# Patient Record
Sex: Female | Born: 1990 | Race: Black or African American | Hispanic: No | Marital: Single | Smoking: Current every day smoker
Health system: Southern US, Community
[De-identification: ages and names within clinical notes are randomized; demographics above are authoritative.]

## PROBLEM LIST (undated history)

## (undated) ENCOUNTER — Inpatient Hospital Stay (HOSPITAL_COMMUNITY): Payer: Self-pay

## (undated) DIAGNOSIS — Z87891 Personal history of nicotine dependence: Secondary | ICD-10-CM

## (undated) DIAGNOSIS — Z3A41 41 weeks gestation of pregnancy: Secondary | ICD-10-CM

## (undated) DIAGNOSIS — N189 Chronic kidney disease, unspecified: Secondary | ICD-10-CM

## (undated) DIAGNOSIS — Z3009 Encounter for other general counseling and advice on contraception: Secondary | ICD-10-CM

## (undated) DIAGNOSIS — Z8619 Personal history of other infectious and parasitic diseases: Secondary | ICD-10-CM

## (undated) DIAGNOSIS — R51 Headache: Secondary | ICD-10-CM

## (undated) DIAGNOSIS — F1290 Cannabis use, unspecified, uncomplicated: Secondary | ICD-10-CM

## (undated) DIAGNOSIS — O480 Post-term pregnancy: Secondary | ICD-10-CM

## (undated) DIAGNOSIS — R519 Headache, unspecified: Secondary | ICD-10-CM

## (undated) DIAGNOSIS — O0990 Supervision of high risk pregnancy, unspecified, unspecified trimester: Secondary | ICD-10-CM

## (undated) DIAGNOSIS — B951 Streptococcus, group B, as the cause of diseases classified elsewhere: Secondary | ICD-10-CM

## (undated) HISTORY — DX: Encounter for other general counseling and advice on contraception: Z30.09

## (undated) HISTORY — DX: Post-term pregnancy: O48.0

## (undated) HISTORY — DX: 41 weeks gestation of pregnancy: Z3A.41

## (undated) HISTORY — DX: Supervision of high risk pregnancy, unspecified, unspecified trimester: O09.90

## (undated) HISTORY — PX: NO PAST SURGERIES: SHX2092

---
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
## 2009-06-18 ENCOUNTER — Emergency Department (HOSPITAL_COMMUNITY): Admission: EM | Admit: 2009-06-18 | Discharge: 2009-06-19 | Payer: Self-pay | Admitting: Emergency Medicine

## 2010-09-29 LAB — WET PREP, GENITAL
Clue Cells Wet Prep HPF POC: NONE SEEN
Trich, Wet Prep: NONE SEEN
Yeast Wet Prep HPF POC: NONE SEEN

## 2010-09-29 LAB — URINALYSIS, ROUTINE W REFLEX MICROSCOPIC
Bilirubin Urine: NEGATIVE
Hgb urine dipstick: NEGATIVE
Nitrite: NEGATIVE
Protein, ur: NEGATIVE mg/dL
Urobilinogen, UA: 1 mg/dL (ref 0.0–1.0)

## 2010-09-29 LAB — PREGNANCY, URINE: Preg Test, Ur: NEGATIVE

## 2010-10-14 LAB — URINALYSIS, ROUTINE W REFLEX MICROSCOPIC
Glucose, UA: NEGATIVE mg/dL
Ketones, ur: NEGATIVE mg/dL
pH: 6 (ref 5.0–8.0)

## 2010-10-14 LAB — URINE MICROSCOPIC-ADD ON

## 2010-10-14 LAB — POCT PREGNANCY, URINE: Preg Test, Ur: NEGATIVE

## 2010-10-14 LAB — GC/CHLAMYDIA PROBE AMP, GENITAL: Chlamydia, DNA Probe: NEGATIVE

## 2010-10-14 LAB — WET PREP, GENITAL: Clue Cells Wet Prep HPF POC: NONE SEEN

## 2012-04-25 ENCOUNTER — Encounter (HOSPITAL_COMMUNITY): Payer: Self-pay | Admitting: Emergency Medicine

## 2012-04-25 ENCOUNTER — Emergency Department (INDEPENDENT_AMBULATORY_CARE_PROVIDER_SITE_OTHER)
Admission: EM | Admit: 2012-04-25 | Discharge: 2012-04-25 | Disposition: A | Discharge status: Home / Self Care | Payer: Self-pay | Source: Home / Self Care | Attending: Family Medicine | Admitting: Family Medicine

## 2012-04-25 DIAGNOSIS — R112 Nausea with vomiting, unspecified: Secondary | ICD-10-CM

## 2012-04-25 DIAGNOSIS — Z331 Pregnant state, incidental: Secondary | ICD-10-CM

## 2012-04-25 DIAGNOSIS — Z3490 Encounter for supervision of normal pregnancy, unspecified, unspecified trimester: Secondary | ICD-10-CM

## 2012-04-25 DIAGNOSIS — R109 Unspecified abdominal pain: Secondary | ICD-10-CM

## 2012-04-25 LAB — POCT PREGNANCY, URINE: Preg Test, Ur: POSITIVE — AB

## 2012-04-25 LAB — POCT URINALYSIS DIP (DEVICE)
Ketones, ur: NEGATIVE mg/dL
pH: 6.5 (ref 5.0–8.0)

## 2012-04-25 MED ORDER — GNP PRENATAL VITAMINS 28-0.8 MG PO TABS: 1.0000 | ORAL_TABLET | Freq: Every day | ORAL | Status: DC

## 2012-04-25 NOTE — ED Provider Notes (Signed)
History     CSN: [id number]  Arrival date & time 04/25/12  1207   None     Chief Complaint  Patient presents with  . Abdominal Pain    (Consider location/radiation/quality/duration/timing/severity/associated sxs/prior treatment) Patient is a 21 y.o. female presenting with abdominal pain. The history is provided by the patient.  Abdominal Pain The primary symptoms of the illness include abdominal pain, nausea and vomiting.  Patient reports she has taken 2 home pregnancy test at home and both were positive.  Presents for verification of test results.  Reports nausea and vomiting for 1 week, Patient's last menstrual period was 03/08/2012.  +abdomial pain, -vaginal discharge or abnormal bleeding, -uti symptoms.    History reviewed. No pertinent past medical history.  History reviewed. No pertinent past surgical history.  No family history on file.  History  Substance Use Topics  . Smoking status: Current Every Day Smoker  . Smokeless tobacco: Not on file  . Alcohol Use: No    OB History    Grav Para Term Preterm Abortions TAB SAB Ect Mult Living                  Review of Systems  Constitutional: Negative.   Respiratory: Negative.   Cardiovascular: Negative.   Gastrointestinal: Positive for nausea, vomiting and abdominal pain.  Genitourinary: Positive for menstrual problem.    Allergies  Review of patient's allergies indicates no known allergies.  Home Medications   Current Outpatient Rx  Name Route Sig Dispense Refill  . GNP PRENATAL VITAMINS 28-0.8 MG PO TABS Oral Take 1 tablet by mouth daily. 30 tablet 6    BP 107/67  Pulse 84  Temp 98.8 F (37.1 C) (Oral)  Resp 18  SpO2 97%  LMP 03/08/2012  Physical Exam  Nursing note and vitals reviewed. Constitutional: She is oriented to person, place, and time. Vital signs are normal. She appears well-developed and well-nourished. She is active and cooperative.  HENT:  Head: Normocephalic.  Mouth/Throat:  Oropharynx is clear and moist. No oropharyngeal exudate.  Eyes: Conjunctivae normal are normal. Pupils are equal, round, and reactive to light. No scleral icterus.  Neck: Trachea normal and normal range of motion. Neck supple.  Cardiovascular: Normal rate, regular rhythm and normal heart sounds.   Pulmonary/Chest: Effort normal and breath sounds normal.  Abdominal: Soft. Bowel sounds are normal. There is no tenderness.  Musculoskeletal: Normal range of motion.  Lymphadenopathy:    She has no cervical adenopathy.  Neurological: She is alert and oriented to person, place, and time. No cranial nerve deficit or sensory deficit.  Skin: Skin is warm and dry.  Psychiatric: She has a normal mood and affect. Her speech is normal and behavior is normal. Judgment and thought content normal. Cognition and memory are normal.    ED Course  Procedures (including critical care time)  Labs Reviewed  POCT URINALYSIS DIP (DEVICE) - Abnormal; Notable for the following:    Bilirubin Urine SMALL (*)     Protein, ur 100 (*)     Leukocytes, UA TRACE (*)  Biochemical Testing Only. Please order routine urinalysis from main lab if confirmatory testing is needed.   All other components within normal limits  POCT PREGNANCY, URINE - Abnormal; Notable for the following:    Preg Test, Ur POSITIVE (*)     All other components within normal limits   No results found.   1. Pregnancy       MDM  Prenatal vitamins daily.  Follow  up with primary care obstetrics provider.  Resources provided for pregnancy.          Johnsie Kindred, NP 04/25/12 [phone]

## 2012-04-25 NOTE — ED Notes (Signed)
Upper abdominal pain  That started 7 days ago.  Reporst vomiting, vomiting is intermittent.  Patient very specific about having taken 2 home preg tests that were positive.  She is very specific about wanting pregnancy test and "letter"

## 2012-04-26 NOTE — ED Provider Notes (Signed)
Medical screening examination/treatment/procedure(s) were performed by resident physician or non-physician practitioner and as supervising physician I was immediately available for consultation/collaboration.   Aryonna Gunnerson DOUGLAS MD.    Cletus Mehlhoff D Shyla Gayheart, MD 04/26/12 0913 

## 2012-05-02 ENCOUNTER — Emergency Department (HOSPITAL_COMMUNITY)
Admission: EM | Admit: 2012-05-02 | Discharge: 2012-05-02 | Discharge status: Against Medical Advice | Payer: Self-pay | Attending: Emergency Medicine | Admitting: Emergency Medicine

## 2012-05-02 ENCOUNTER — Encounter (HOSPITAL_COMMUNITY): Payer: Self-pay | Admitting: Emergency Medicine

## 2012-05-02 DIAGNOSIS — Z5320 Procedure and treatment not carried out because of patient's decision for unspecified reasons: Secondary | ICD-10-CM | POA: Insufficient documentation

## 2012-05-02 DIAGNOSIS — F17200 Nicotine dependence, unspecified, uncomplicated: Secondary | ICD-10-CM | POA: Insufficient documentation

## 2012-05-02 LAB — POCT I-STAT, CHEM 8
BUN: 8 mg/dL (ref 6–23)
Calcium, Ion: 1.31 mmol/L — ABNORMAL HIGH (ref 1.12–1.23)
Creatinine, Ser: 0.8 mg/dL (ref 0.50–1.10)
Hemoglobin: 15 g/dL (ref 12.0–15.0)
Sodium: 138 mEq/L (ref 135–145)
TCO2: 23 mmol/L (ref 0–100)

## 2012-05-02 LAB — URINE MICROSCOPIC-ADD ON

## 2012-05-02 LAB — URINALYSIS, ROUTINE W REFLEX MICROSCOPIC
Hgb urine dipstick: NEGATIVE
Nitrite: NEGATIVE
Protein, ur: 30 mg/dL — AB
Specific Gravity, Urine: 1.033 — ABNORMAL HIGH (ref 1.005–1.030)
Urobilinogen, UA: 1 mg/dL (ref 0.0–1.0)

## 2012-05-02 LAB — POCT PREGNANCY, URINE: Preg Test, Ur: POSITIVE — AB

## 2012-05-02 NOTE — ED Notes (Addendum)
Pt c/o increased vomiting; pt sts is pregnant with LMP beginning of September; pt sts unabe to keep down prenatal vitamins; pt G2 P1

## 2012-05-04 LAB — URINE CULTURE: Colony Count: 85000

## 2012-06-25 ENCOUNTER — Inpatient Hospital Stay (HOSPITAL_COMMUNITY)
Admission: AD | Admit: 2012-06-25 | Discharge: 2012-06-25 | Disposition: A | Discharge status: Home / Self Care | Payer: Medicaid Other | Source: Ambulatory Visit | Attending: Family Medicine | Admitting: Family Medicine

## 2012-06-25 ENCOUNTER — Encounter (HOSPITAL_COMMUNITY): Payer: Self-pay | Admitting: *Deleted

## 2012-06-25 DIAGNOSIS — N390 Urinary tract infection, site not specified: Secondary | ICD-10-CM | POA: Insufficient documentation

## 2012-06-25 DIAGNOSIS — O26859 Spotting complicating pregnancy, unspecified trimester: Secondary | ICD-10-CM | POA: Insufficient documentation

## 2012-06-25 DIAGNOSIS — O0930 Supervision of pregnancy with insufficient antenatal care, unspecified trimester: Secondary | ICD-10-CM | POA: Insufficient documentation

## 2012-06-25 DIAGNOSIS — O2340 Unspecified infection of urinary tract in pregnancy, unspecified trimester: Secondary | ICD-10-CM

## 2012-06-25 DIAGNOSIS — O2390 Unspecified genitourinary tract infection in pregnancy, unspecified trimester: Secondary | ICD-10-CM

## 2012-06-25 LAB — URINALYSIS, ROUTINE W REFLEX MICROSCOPIC
Glucose, UA: NEGATIVE mg/dL
Ketones, ur: NEGATIVE mg/dL
Protein, ur: NEGATIVE mg/dL
Urobilinogen, UA: 0.2 mg/dL (ref 0.0–1.0)

## 2012-06-25 MED ORDER — NITROFURANTOIN MONOHYD MACRO 100 MG PO CAPS: 100.0000 mg | ORAL_CAPSULE | Freq: Two times a day (BID) | ORAL | Status: AC

## 2012-06-25 NOTE — MAU Note (Signed)
Pt reports she has had pink spotting since last night. Had some abd cramping yesterday none today. Pt had intercourse last night and saw the spotting afterward

## 2012-06-25 NOTE — MAU Provider Note (Signed)
History     CSN: [id number]  Arrival date and time: 06/25/12 1478   First Provider Initiated Contact with Patient 06/25/12 1028      Chief Complaint  Patient presents with  . Vaginal Bleeding   HPI Marisa Stephens is a 21 y.o. G46P1011 female at [redacted]w[redacted]d by stated lmp, hasn't received pnc as of yet- has contacted dr. Gaynell Face, but waiting on medicaid number.  Reports small amount light pink spotting from urethra when wiping w/ voiding since yesterday.  Last sexual intercourse last pm.  Increased urinary frequency, dysuria, and pressure w/ voiding, as well as urgency and hesitancy since yesterday.  Denies cramping, vaginal bleeding, lof, fever/chills, back/flank pain.  Reports good fm w/ quickening app 4-6wks ago.  OB History    Grav Para Term Preterm Abortions TAB SAB Ect Mult Living   3 1 1  1  1   1       Past Medical History  Diagnosis Date  . No pertinent past medical history     Past Surgical History  Procedure Date  . No past surgeries     Family History  Problem Relation Age of Onset  . Diabetes Maternal Grandmother     History  Substance Use Topics  . Smoking status: Current Every Day Smoker -- 0.2 packs/day for 5 years    Types: Cigarettes  . Smokeless tobacco: Not on file  . Alcohol Use: No    Allergies: No Known Allergies  No prescriptions prior to admission    Review of Systems  Constitutional: Negative.  Negative for fever and chills.  HENT: Negative.   Eyes: Negative.   Respiratory: Negative.   Cardiovascular: Negative.   Gastrointestinal: Negative.   Genitourinary: Positive for dysuria, urgency, frequency and hematuria. Negative for flank pain.       Also pressure   Musculoskeletal: Negative.   Skin: Negative.   Neurological: Negative.   Endo/Heme/Allergies: Negative.   Psychiatric/Behavioral: Negative.    Physical Exam   Blood pressure 124/61, pulse 101, temperature 97.6 F (36.4 C), temperature source Oral, resp. rate 18, height 5'  2" (1.575 m), weight 58.605 kg (129 lb 3.2 oz), last menstrual period 03/08/2012.  Physical Exam  Constitutional: She is oriented to person, place, and time. She appears well-developed and well-nourished.  HENT:  Head: Normocephalic.  Neck: Normal range of motion.  Cardiovascular: Normal rate and regular rhythm.   Respiratory: Effort normal and breath sounds normal.  GI: She exhibits no distension. There is no tenderness. There is no CVA tenderness.  Genitourinary:       Fundal height: u-1 Pelvic deferred    Musculoskeletal: Normal range of motion.  Neurological: She is alert and oriented to person, place, and time. She has normal reflexes.  Skin: Skin is warm and dry.  Psychiatric: She has a normal mood and affect. Her behavior is normal. Judgment and thought content normal.   FHT via doppler: 160  MAU Course  Procedures  FHT via doppler Physical exam UA, C&S  Results for orders placed during the hospital encounter of 06/25/12 (from the past 24 hour(s))  URINALYSIS, ROUTINE W REFLEX MICROSCOPIC     Status: Abnormal   Collection Time   06/25/12  8:55 AM      Component Value Range   Color, Urine YELLOW  YELLOW   APPearance CLOUDY (*) CLEAR   Specific Gravity, Urine 1.020  1.005 - 1.030   pH 6.0  5.0 - 8.0   Glucose, UA NEGATIVE  NEGATIVE mg/dL  Hgb urine dipstick MODERATE (*) NEGATIVE   Bilirubin Urine NEGATIVE  NEGATIVE   Ketones, ur NEGATIVE  NEGATIVE mg/dL   Protein, ur NEGATIVE  NEGATIVE mg/dL   Urobilinogen, UA 0.2  0.0 - 1.0 mg/dL   Nitrite NEGATIVE  NEGATIVE   Leukocytes, UA MODERATE (*) NEGATIVE  URINE MICROSCOPIC-ADD ON     Status: Abnormal   Collection Time   06/25/12  8:55 AM      Component Value Range   Squamous Epithelial / LPF FEW (*) RARE   WBC, UA 21-50  <3 WBC/hpf   RBC / HPF 0-2  <3 RBC/hpf     Seraphim, [place]  Home Medication Instructions [id number]   Printed on:06/25/12 1046  Medication Information                    nitrofurantoin,  macrocrystal-monohydrate, (MACROBID) 100 MG capsule Take 1 capsule (100 mg total) by mouth 2 (two) times daily. X 7days            Follow-up Information    Schedule an appointment as soon as possible for a visit with MARSHALL,BERNARD A, MD. (for your new ob visit as soon as you get your medicaid number in.)    Contact information:   [address] [place] [address] [phone]           Assessment and Plan  A:  [redacted]w[redacted]d pregnant  No PNC  UTI   P:  D/C home  Rx Macrobid 100mg  po bid x 7d  Make appt w/ Dr. Gaynell Face to begin [facility] ASAP  Push po fluids, cranberry juice, wipe front to back  Return to hospital if fever/chills, flank/back pain, or other concerns   Marge Duncans 06/25/2012, 10:29 AM

## 2012-06-25 NOTE — MAU Provider Note (Signed)
Chart reviewed and agree with management and plan.  

## 2012-06-25 NOTE — MAU Note (Signed)
"  Since yesterday when I wipe I get a pink d/c.  Sometimes it is a red mucousy thing that comes out too.  No abd pain.  I have pain when I have to use the BR...where my pee comes out at.  I feel pressure after I use the BR."

## 2012-06-25 NOTE — Progress Notes (Signed)
Pt inquiring about how much longer it would be before she is seen by a doctor.  Pt informed that Selena Batten, CNM was contacted after RN left room and would be here to see her soon.   Ice chips given.

## 2012-06-27 LAB — URINE CULTURE
Colony Count: 75000
Special Requests: NORMAL

## 2012-06-29 NOTE — L&D Delivery Note (Signed)
Delivery Note At 4:55 PM a viable female was delivered via Vaginal, Spontaneous Delivery (Presentation: ; Occiput Anterior).  APGAR: 9, 9; weight .   Placenta status: Intact, Spontaneous.  Cord: 3 vessels with the following complications: None.  Cord pH: not done  Anesthesia: Epidural  Episiotomy: None Lacerations: None Suture Repair: 2.0 Est. Blood Loss (mL):   Mom to postpartum.  Baby to nursery-stable.  Zeola Brys A 12/06/2012, 5:26 PM

## 2012-08-26 LAB — OB RESULTS CONSOLE ABO/RH: RH Type: POSITIVE

## 2012-08-26 LAB — OB RESULTS CONSOLE RUBELLA ANTIBODY, IGM: Rubella: IMMUNE

## 2012-08-26 LAB — OB RESULTS CONSOLE HIV ANTIBODY (ROUTINE TESTING): HIV: NONREACTIVE

## 2012-08-26 LAB — OB RESULTS CONSOLE HEPATITIS B SURFACE ANTIGEN: Hepatitis B Surface Ag: NEGATIVE

## 2012-10-08 ENCOUNTER — Inpatient Hospital Stay (HOSPITAL_COMMUNITY)
Admission: AD | Admit: 2012-10-08 | Discharge: 2012-10-09 | Disposition: A | Discharge status: Home / Self Care | Payer: Medicaid Other | Source: Ambulatory Visit | Attending: Obstetrics | Admitting: Obstetrics

## 2012-10-08 ENCOUNTER — Encounter (HOSPITAL_COMMUNITY): Payer: Self-pay | Admitting: *Deleted

## 2012-10-08 DIAGNOSIS — O479 False labor, unspecified: Secondary | ICD-10-CM | POA: Insufficient documentation

## 2012-10-08 DIAGNOSIS — O4703 False labor before 37 completed weeks of gestation, third trimester: Secondary | ICD-10-CM

## 2012-10-08 LAB — URINALYSIS, ROUTINE W REFLEX MICROSCOPIC
Bilirubin Urine: NEGATIVE
Glucose, UA: NEGATIVE mg/dL
Ketones, ur: NEGATIVE mg/dL
Protein, ur: NEGATIVE mg/dL

## 2012-10-08 LAB — WET PREP, GENITAL
Trich, Wet Prep: NONE SEEN
Yeast Wet Prep HPF POC: NONE SEEN

## 2012-10-08 LAB — URINE MICROSCOPIC-ADD ON

## 2012-10-08 MED ORDER — NIFEDIPINE 10 MG PO CAPS
10.0000 mg | ORAL_CAPSULE | Freq: Once | ORAL | Status: AC
  Administered 2012-10-08: 10 mg via ORAL
  Filled 2012-10-08: qty 1

## 2012-10-08 NOTE — MAU Provider Note (Signed)
History     CSN: [id number]  Arrival date and time: 10/08/12 2137   None     Chief Complaint  Patient presents with  . Vaginal Discharge   HPI Marisa Stephens is a 22 y.o. G31P1011 female at [redacted]w[redacted]d who presents w/ report of losing mucous plug at 2000 tonight- describes as greenish-yellow mucous, uc's x 3 days becoming more frequent today. Reports good fm. Denies lof, vb, abnormal or malodorous d/c. Does report vaginal itching x 1 wk, urinary frequency and urgency x 3d. Denies dysuria or hesitancy. Receives pnc w/ dr. Gaynell Face. Last appt at end of March. Next appt on 4/15.   OB History   Grav Para Term Preterm Abortions TAB SAB Ect Mult Living   3 1 1  1  1   1       Past Medical History  Diagnosis Date  . No pertinent past medical history   . Medical history non-contributory     Past Surgical History  Procedure Laterality Date  . No past surgeries      Family History  Problem Relation Age of Onset  . Diabetes Maternal Grandmother     History  Substance Use Topics  . Smoking status: Current Every Day Smoker -- 0.25 packs/day for 5 years    Types: Cigarettes  . Smokeless tobacco: Not on file  . Alcohol Use: No    Allergies: No Known Allergies  Prescriptions prior to admission  Medication Sig Dispense Refill  . acetaminophen (TYLENOL) 500 MG tablet Take 500 mg by mouth every 6 (six) hours as needed for pain.      . Prenatal Vit-Fe Fumarate-FA (PRENATAL MULTIVITAMIN) TABS Take 1 tablet by mouth daily at 12 noon.        Review of Systems  Constitutional: Negative.   HENT: Negative.   Eyes: Negative.   Respiratory: Negative.   Cardiovascular: Negative.   Gastrointestinal: Positive for abdominal pain (pelvic pressure today).  Genitourinary: Positive for frequency. Negative for dysuria, urgency and hematuria.  Musculoskeletal: Negative.   Skin: Negative.   Neurological: Negative.   Endo/Heme/Allergies: Negative.   Psychiatric/Behavioral: Negative.     Physical Exam   Blood pressure 133/86, pulse 132, temperature 98.2 F (36.8 C), resp. rate 18, height 5' (1.524 m), weight 67.405 kg (148 lb 9.6 oz), last menstrual period 03/08/2012, SpO2 100.00%.  Physical Exam  Constitutional: She is oriented to person, place, and time. She appears well-developed and well-nourished.  HENT:  Head: Normocephalic.  Neck: Normal range of motion.  Cardiovascular: Normal rate and regular rhythm.   Respiratory: Effort normal and breath sounds normal.  GI: Soft.  gravid  Genitourinary: Vagina normal and uterus normal.  Spec exam: small amount white non-odorous d/c, cervix visually closed.  Gc/ct, wet prep obtained.  Had sex today, fFN not collected  SVE: 1.5/th/-2, vtx  Musculoskeletal: Normal range of motion.  Neurological: She is alert and oriented to person, place, and time. She has normal reflexes.  Skin: Skin is warm and dry.  Psychiatric: She has a normal mood and affect. Her behavior is normal. Judgment and thought content normal.  FHR: 155, mod variability, 15x15 accels, no decels=Cat I UCs: q 2-4  MAU Course  Procedures  NST  UA Spec exam w/ wet prep, gc/ct obtained SVE Procardia 10mg  po x1  Results for orders placed during the hospital encounter of 10/08/12 (from the past 24 hour(s))  URINALYSIS, ROUTINE W REFLEX MICROSCOPIC     Status: Abnormal   Collection Time  10/08/12  9:55 PM      Result Value Range   Color, Urine YELLOW  YELLOW   APPearance CLEAR  CLEAR   Specific Gravity, Urine <1.005 (*) 1.005 - 1.030   pH 6.0  5.0 - 8.0   Glucose, UA NEGATIVE  NEGATIVE mg/dL   Hgb urine dipstick TRACE (*) NEGATIVE   Bilirubin Urine NEGATIVE  NEGATIVE   Ketones, ur NEGATIVE  NEGATIVE mg/dL   Protein, ur NEGATIVE  NEGATIVE mg/dL   Urobilinogen, UA 0.2  0.0 - 1.0 mg/dL   Nitrite NEGATIVE  NEGATIVE   Leukocytes, UA MODERATE (*) NEGATIVE  URINE MICROSCOPIC-ADD ON     Status: None   Collection Time    10/08/12  9:55 PM      Result  Value Range   Squamous Epithelial / LPF RARE  RARE   WBC, UA 3-6  <3 WBC/hpf   RBC / HPF 0-2  <3 RBC/hpf  WET PREP, GENITAL     Status: Abnormal   Collection Time    10/08/12 11:15 PM      Result Value Range   Yeast Wet Prep HPF POC NONE SEEN  NONE SEEN   Trich, Wet Prep NONE SEEN  NONE SEEN   Clue Cells Wet Prep HPF POC FEW (*) NONE SEEN   WBC, Wet Prep HPF POC FEW (*) NONE SEEN    2326: Discussed w/ Dr. Tamela Oddi, give procardia po x 1 now 0020: Notified Dr. Jean RosenthalChristell Constant 1 dose of procardia hasn't slowed uc's, orders to recheck- if still the same can send home, treat BV Assessment and Plan  A:  [redacted]w[redacted]d SIUP  Preterm uc's  Cat I FHR  BV  P:  D/C home per Dr. Tamela Oddi  Rx Metronidazole 500mg  BID x 7d  Keep next appt as scheduled on 4/15  Return for increased frequency/intensity of uc's, lof, vb, decreased fm or other concerns   Marge Duncans 10/08/2012, 11:03 PM

## 2012-10-08 NOTE — MAU Note (Signed)
Lost mucous plug about 2000. Contractions for 3 days. Pelvic pressure

## 2012-10-08 NOTE — MAU Note (Signed)
Pt. Here due to losing mucous plug around 2000 tonite. Has been contracting for 3 days per Aunt contractions have gotten closer together this evening. Denies any leakage. Denies blood. Baby is active per pt. No problems with movement.

## 2012-10-09 DIAGNOSIS — O479 False labor, unspecified: Secondary | ICD-10-CM

## 2012-10-09 MED ORDER — METRONIDAZOLE 500 MG PO TABS: 500.0000 mg | ORAL_TABLET | Freq: Two times a day (BID) | ORAL | Status: DC

## 2012-10-10 LAB — GC/CHLAMYDIA PROBE AMP: CT Probe RNA: NEGATIVE

## 2012-11-08 LAB — OB RESULTS CONSOLE GC/CHLAMYDIA: Gonorrhea: NEGATIVE

## 2012-12-06 ENCOUNTER — Encounter (HOSPITAL_COMMUNITY): Payer: Self-pay | Admitting: Anesthesiology

## 2012-12-06 ENCOUNTER — Inpatient Hospital Stay (HOSPITAL_COMMUNITY)
Admission: AD | Admit: 2012-12-06 | Discharge: 2012-12-08 | DRG: 775 | Disposition: A | Discharge status: Home / Self Care | Payer: Medicaid Other | Source: Ambulatory Visit | Attending: Obstetrics | Admitting: Obstetrics

## 2012-12-06 ENCOUNTER — Inpatient Hospital Stay (HOSPITAL_COMMUNITY): Payer: Medicaid Other | Admitting: Anesthesiology

## 2012-12-06 ENCOUNTER — Encounter (HOSPITAL_COMMUNITY): Payer: Self-pay

## 2012-12-06 DIAGNOSIS — Z22330 Carrier of Group B streptococcus: Secondary | ICD-10-CM

## 2012-12-06 DIAGNOSIS — O99892 Other specified diseases and conditions complicating childbirth: Principal | ICD-10-CM | POA: Diagnosis present

## 2012-12-06 LAB — CBC
HCT: 36.8 % (ref 36.0–46.0)
Hemoglobin: 12.8 g/dL (ref 12.0–15.0)
MCHC: 34.8 g/dL (ref 30.0–36.0)
MCV: 93.9 fL (ref 78.0–100.0)
RDW: 13.1 % (ref 11.5–15.5)

## 2012-12-06 MED ORDER — IBUPROFEN 600 MG PO TABS
600.0000 mg | ORAL_TABLET | Freq: Four times a day (QID) | ORAL | Status: DC | PRN
  Administered 2012-12-07: 600 mg via ORAL
  Filled 2012-12-06 (×2): qty 1

## 2012-12-06 MED ORDER — ACETAMINOPHEN 325 MG PO TABS: 650.0000 mg | ORAL_TABLET | ORAL | Status: DC | PRN

## 2012-12-06 MED ORDER — BUTORPHANOL TARTRATE 1 MG/ML IJ SOLN
1.0000 mg | INTRAMUSCULAR | Status: DC | PRN
  Administered 2012-12-06: 1 mg via INTRAVENOUS
  Filled 2012-12-06: qty 1

## 2012-12-06 MED ORDER — ZOLPIDEM TARTRATE 5 MG PO TABS: 5.0000 mg | ORAL_TABLET | Freq: Every evening | ORAL | Status: DC | PRN

## 2012-12-06 MED ORDER — WITCH HAZEL-GLYCERIN EX PADS: 1.0000 "application " | MEDICATED_PAD | CUTANEOUS | Status: DC | PRN

## 2012-12-06 MED ORDER — ONDANSETRON HCL 4 MG PO TABS: 4.0000 mg | ORAL_TABLET | ORAL | Status: DC | PRN

## 2012-12-06 MED ORDER — FENTANYL 2.5 MCG/ML BUPIVACAINE 1/10 % EPIDURAL INFUSION (WH - ANES)
14.0000 mL/h | INTRAMUSCULAR | Status: DC | PRN
  Filled 2012-12-06: qty 125

## 2012-12-06 MED ORDER — OXYTOCIN 40 UNITS IN LACTATED RINGERS INFUSION - SIMPLE MED: 62.5000 mL/h | INTRAVENOUS | Status: DC

## 2012-12-06 MED ORDER — TETANUS-DIPHTH-ACELL PERTUSSIS 5-2.5-18.5 LF-MCG/0.5 IM SUSP
0.5000 mL | Freq: Once | INTRAMUSCULAR | Status: AC
Start: 2012-12-07 — End: 2012-12-07
  Administered 2012-12-07: 0.5 mL via INTRAMUSCULAR

## 2012-12-06 MED ORDER — DIPHENHYDRAMINE HCL 50 MG/ML IJ SOLN: 12.5000 mg | INTRAMUSCULAR | Status: DC | PRN

## 2012-12-06 MED ORDER — ONDANSETRON HCL 4 MG/2ML IJ SOLN: 4.0000 mg | Freq: Four times a day (QID) | INTRAMUSCULAR | Status: DC | PRN

## 2012-12-06 MED ORDER — ONDANSETRON HCL 4 MG/2ML IJ SOLN: 4.0000 mg | INTRAMUSCULAR | Status: DC | PRN

## 2012-12-06 MED ORDER — SENNOSIDES-DOCUSATE SODIUM 8.6-50 MG PO TABS
2.0000 | ORAL_TABLET | Freq: Every day | ORAL | Status: DC
  Administered 2012-12-06: 2 via ORAL

## 2012-12-06 MED ORDER — PHENYLEPHRINE 40 MCG/ML (10ML) SYRINGE FOR IV PUSH (FOR BLOOD PRESSURE SUPPORT)
80.0000 ug | PREFILLED_SYRINGE | INTRAVENOUS | Status: DC | PRN
  Filled 2012-12-06: qty 2

## 2012-12-06 MED ORDER — EPHEDRINE 5 MG/ML INJ
10.0000 mg | INTRAVENOUS | Status: DC | PRN
  Filled 2012-12-06: qty 2

## 2012-12-06 MED ORDER — LANOLIN HYDROUS EX OINT: TOPICAL_OINTMENT | CUTANEOUS | Status: DC | PRN

## 2012-12-06 MED ORDER — LIDOCAINE HCL (PF) 1 % IJ SOLN
30.0000 mL | INTRAMUSCULAR | Status: DC | PRN
  Filled 2012-12-06 (×2): qty 30

## 2012-12-06 MED ORDER — SIMETHICONE 80 MG PO CHEW: 80.0000 mg | CHEWABLE_TABLET | ORAL | Status: DC | PRN

## 2012-12-06 MED ORDER — FLEET ENEMA 7-19 GM/118ML RE ENEM: 1.0000 | ENEMA | RECTAL | Status: DC | PRN

## 2012-12-06 MED ORDER — PENICILLIN G POTASSIUM 5000000 UNITS IJ SOLR
5.0000 10*6.[IU] | Freq: Once | INTRAVENOUS | Status: AC
  Administered 2012-12-06: 5 10*6.[IU] via INTRAVENOUS
  Filled 2012-12-06: qty 5

## 2012-12-06 MED ORDER — OXYCODONE-ACETAMINOPHEN 5-325 MG PO TABS: 1.0000 | ORAL_TABLET | ORAL | Status: DC | PRN

## 2012-12-06 MED ORDER — DIBUCAINE 1 % RE OINT: 1.0000 "application " | TOPICAL_OINTMENT | RECTAL | Status: DC | PRN

## 2012-12-06 MED ORDER — FENTANYL 2.5 MCG/ML BUPIVACAINE 1/10 % EPIDURAL INFUSION (WH - ANES)
INTRAMUSCULAR | Status: DC | PRN
  Administered 2012-12-06: 14 mL/h via EPIDURAL

## 2012-12-06 MED ORDER — OXYTOCIN 40 UNITS IN LACTATED RINGERS INFUSION - SIMPLE MED
1.0000 m[IU]/min | INTRAVENOUS | Status: DC
  Administered 2012-12-06: 2 m[IU]/min via INTRAVENOUS
  Administered 2012-12-06: 4 m[IU]/min via INTRAVENOUS
  Filled 2012-12-06: qty 1000

## 2012-12-06 MED ORDER — PHENYLEPHRINE 40 MCG/ML (10ML) SYRINGE FOR IV PUSH (FOR BLOOD PRESSURE SUPPORT)
80.0000 ug | PREFILLED_SYRINGE | INTRAVENOUS | Status: DC | PRN
  Filled 2012-12-06: qty 5
  Filled 2012-12-06: qty 2

## 2012-12-06 MED ORDER — CITRIC ACID-SODIUM CITRATE 334-500 MG/5ML PO SOLN: 30.0000 mL | ORAL | Status: DC | PRN

## 2012-12-06 MED ORDER — IBUPROFEN 600 MG PO TABS
600.0000 mg | ORAL_TABLET | Freq: Four times a day (QID) | ORAL | Status: DC
  Administered 2012-12-07 – 2012-12-08 (×5): 600 mg via ORAL
  Filled 2012-12-06 (×4): qty 1

## 2012-12-06 MED ORDER — LACTATED RINGERS IV SOLN: 500.0000 mL | INTRAVENOUS | Status: DC | PRN

## 2012-12-06 MED ORDER — LIDOCAINE HCL (PF) 1 % IJ SOLN
INTRAMUSCULAR | Status: DC | PRN
  Administered 2012-12-06 (×2): 8 mL

## 2012-12-06 MED ORDER — OXYTOCIN BOLUS FROM INFUSION: 500.0000 mL | INTRAVENOUS | Status: DC

## 2012-12-06 MED ORDER — OXYCODONE-ACETAMINOPHEN 5-325 MG PO TABS
1.0000 | ORAL_TABLET | ORAL | Status: DC | PRN
  Administered 2012-12-07: 2 via ORAL
  Filled 2012-12-06: qty 2
  Filled 2012-12-06: qty 1

## 2012-12-06 MED ORDER — PRENATAL MULTIVITAMIN CH
1.0000 | ORAL_TABLET | Freq: Every day | ORAL | Status: DC
  Administered 2012-12-07: 1 via ORAL
  Filled 2012-12-06: qty 1

## 2012-12-06 MED ORDER — FERROUS SULFATE 325 (65 FE) MG PO TABS
325.0000 mg | ORAL_TABLET | Freq: Two times a day (BID) | ORAL | Status: DC
  Administered 2012-12-07 (×2): 325 mg via ORAL
  Filled 2012-12-06 (×3): qty 1

## 2012-12-06 MED ORDER — LACTATED RINGERS IV SOLN
500.0000 mL | Freq: Once | INTRAVENOUS | Status: AC
  Administered 2012-12-06: 500 mL via INTRAVENOUS

## 2012-12-06 MED ORDER — PENICILLIN G POTASSIUM 5000000 UNITS IJ SOLR
2.5000 10*6.[IU] | INTRAVENOUS | Status: DC
  Administered 2012-12-06: 2.5 10*6.[IU] via INTRAVENOUS
  Filled 2012-12-06 (×4): qty 2.5

## 2012-12-06 MED ORDER — TERBUTALINE SULFATE 1 MG/ML IJ SOLN: 0.2500 mg | Freq: Once | INTRAMUSCULAR | Status: DC | PRN

## 2012-12-06 MED ORDER — DIPHENHYDRAMINE HCL 25 MG PO CAPS: 25.0000 mg | ORAL_CAPSULE | Freq: Four times a day (QID) | ORAL | Status: DC | PRN

## 2012-12-06 MED ORDER — LACTATED RINGERS IV SOLN
INTRAVENOUS | Status: DC
  Administered 2012-12-06 (×2): via INTRAVENOUS

## 2012-12-06 MED ORDER — BENZOCAINE-MENTHOL 20-0.5 % EX AERO: 1.0000 "application " | INHALATION_SPRAY | CUTANEOUS | Status: DC | PRN

## 2012-12-06 MED ORDER — EPHEDRINE 5 MG/ML INJ
10.0000 mg | INTRAVENOUS | Status: DC | PRN
  Filled 2012-12-06: qty 4
  Filled 2012-12-06: qty 2

## 2012-12-06 NOTE — H&P (Signed)
This is Dr. Francoise Ceo dictating the history and physical on  Marisa Stephens she's a 21 year oldgravida 3 para 1011 at 104 and 5 EDC 12/15/2012 membranes ruptured spontaneously at 8:00 this morning having irregular contractions cervix is 2 cm 90% vertex -2-3 and she will has a positive GBS on penicillin today on low-dose Pitocin Past medical history negative Past surgical history negative Social history negative System review negative Physical exam well-developed female in no distress HEENT negative Lungs clear to P&A Breasts negative Abdomen term Pelvic as described above Extremities negative

## 2012-12-06 NOTE — Anesthesia Preprocedure Evaluation (Signed)

## 2012-12-06 NOTE — MAU Note (Signed)
Pt states was 2cm in office last week. Water broke around 0800, clear fluid. Irregular contractions.

## 2012-12-06 NOTE — MAU Note (Signed)
Patient states she started leaking clear fluid at 0800.  Continues to leak down legs on arrival to MAU. States contractions irregular. Denies bleeding and reports good fetal movement.

## 2012-12-06 NOTE — Anesthesia Procedure Notes (Signed)
Epidural Patient location during procedure: OB Start time: 12/06/2012 3:12 PM End time: 12/06/2012 3:16 PM  Staffing Anesthesiologist: Sandrea Hughs Performed by: anesthesiologist   Preanesthetic Checklist Completed: patient identified, site marked, surgical consent, pre-op evaluation, timeout performed, IV checked, risks and benefits discussed and monitors and equipment checked  Epidural Patient position: sitting Prep: site prepped and draped and DuraPrep Patient monitoring: continuous pulse ox and blood pressure Approach: midline Injection technique: LOR air  Needle:  Needle type: Tuohy  Needle gauge: 17 G Needle length: 9 cm and 9 Needle insertion depth: 5 cm cm Catheter type: closed end flexible Catheter size: 19 Gauge Catheter at skin depth: 10 cm Test dose: negative and Other  Assessment Sensory level: T9 Events: blood not aspirated, injection not painful, no injection resistance, negative IV test and no paresthesia  Additional Notes Reason for block:procedure for pain

## 2012-12-07 ENCOUNTER — Encounter (HOSPITAL_COMMUNITY): Payer: Self-pay | Admitting: *Deleted

## 2012-12-07 LAB — CBC
Hemoglobin: 9.9 g/dL — ABNORMAL LOW (ref 12.0–15.0)
Platelets: 209 10*3/uL (ref 150–400)
RBC: 3.04 MIL/uL — ABNORMAL LOW (ref 3.87–5.11)
WBC: 12.3 10*3/uL — ABNORMAL HIGH (ref 4.0–10.5)

## 2012-12-07 NOTE — Progress Notes (Signed)
UR chart review completed.  

## 2012-12-07 NOTE — Progress Notes (Signed)
Patient ID: Marisa Stephens, female   DOB: 1990-09-14, 22 y.o.   MRN: [id number] Postpartum day one Vital signs normal Fundus firm Lochia moderate Legs negative doing well

## 2012-12-07 NOTE — Anesthesia Postprocedure Evaluation (Signed)
Anesthesia Post Note  Patient: Marisa Stephens  Procedure(s) Performed: * No procedures listed *  Anesthesia type: Epidural  Patient location: Mother/Baby  Post pain: Pain level controlled  Post assessment: Post-op Vital signs reviewed  Last Vitals:  Filed Vitals:   12/07/12 0825  BP: 108/70  Pulse: 79  Temp: 36.6 C  Resp: 15    Post vital signs: Reviewed  Level of consciousness: awake  Complications: No apparent anesthesia complications

## 2012-12-07 NOTE — Anesthesia Postprocedure Evaluation (Signed)
  Anesthesia Post-op Note  Patient: Marisa Stephens  Procedure(s) Performed: * No procedures listed *  Patient Location: Mother/Baby  Anesthesia Type:Epidural  Level of Consciousness: awake  Airway and Oxygen Therapy: Patient Spontanous Breathing  Post-op Pain: mild  Post-op Assessment: Patient's Cardiovascular Status Stable and Respiratory Function Stable  Post-op Vital Signs: stable  Complications: No apparent anesthesia complications

## 2012-12-08 NOTE — Discharge Summary (Signed)
Obstetric Discharge Summary Reason for Admission: onset of labor Prenatal Procedures: none Intrapartum Procedures: spontaneous vaginal delivery Postpartum Procedures: none Complications-Operative and Postpartum: none Hemoglobin  Date Value Range Status  12/07/2012 9.9* 12.0 - 15.0 g/dL Final     DELTA CHECK NOTED     REPEATED TO VERIFY     HCT  Date Value Range Status  12/07/2012 28.6* 36.0 - 46.0 % Final    Physical Exam:  General: alert Lochia: appropriate Uterine Fundus: firm Incision: healing well DVT Evaluation: No evidence of DVT seen on physical exam.  Discharge Diagnoses: Term Pregnancy-delivered  Discharge Information: Date: 12/08/2012 Activity: pelvic rest Diet: routine Medications: Percocet Condition: stable Instructions: refer to practice specific booklet Discharge to: home Follow-up Information   Follow up with MARSHALL,BERNARD A, MD. Schedule an appointment as soon as possible for a visit in 6 weeks.   Contact information:   [address] [place] [address] [phone]       Newborn Data: Live born female  Birth Weight: 6 lb 8.4 oz (2960 g) APGAR: 9, 9  Home with mother.  MARSHALL,BERNARD A 12/08/2012, 7:02 AM

## 2012-12-08 NOTE — Lactation Note (Signed)
This note was copied from the chart of Girl Dannae Kato. Lactation Consultation Note  Patient Name: Girl Jensine Luz RUEAV'W Date: 12/08/2012 Reason for consult: Follow-up assessment   Maternal Data    Feeding   LATCH Score/Interventions                      Lactation Tools Discussed/Used     Consult Status Consult Status: Complete  Mom reports that baby is latching well. LS 10 by RN. No questions at present. To call prn  Pamelia Hoit 12/08/2012, 8:49 AM

## 2012-12-08 NOTE — Progress Notes (Signed)
Patient ID: Marisa Stephens, female   DOB: 10-23-1990, 23 y.o.   MRN: [id number] Postpartum day 2 Vital signs normal Fundus firm Lochia moderate Legs negative discharge today

## 2013-06-29 NOTE — L&D Delivery Note (Signed)
Delivery Note At 7:35 AM a viable female was delivered via Vaginal, Spontaneous Delivery (Presentation: ;  ).  APGAR: , ; weight .   Placenta status: , .  Cord:  with the following complications: .  Cord pH: not done  Anesthesia:   Episiotomy:  Lacerations:  Suture Repair: 2.0 Est. Blood Loss (mL):   Mom to postpartum.  Baby to Couplet care / Skin to Skin.  MARSHALL,BERNARD A 12/19/2013, 7:45 AM

## 2013-12-19 ENCOUNTER — Inpatient Hospital Stay (HOSPITAL_COMMUNITY)
Admission: AD | Admit: 2013-12-19 | Discharge: 2013-12-21 | DRG: 774 | Disposition: A | Discharge status: Home / Self Care | Payer: Medicaid Other | Source: Ambulatory Visit | Attending: Obstetrics | Admitting: Obstetrics

## 2013-12-19 ENCOUNTER — Encounter (HOSPITAL_COMMUNITY): Payer: Self-pay | Admitting: *Deleted

## 2013-12-19 DIAGNOSIS — O9989 Other specified diseases and conditions complicating pregnancy, childbirth and the puerperium: Secondary | ICD-10-CM

## 2013-12-19 DIAGNOSIS — O99892 Other specified diseases and conditions complicating childbirth: Secondary | ICD-10-CM | POA: Diagnosis present

## 2013-12-19 DIAGNOSIS — Z22330 Carrier of Group B streptococcus: Secondary | ICD-10-CM

## 2013-12-19 DIAGNOSIS — O4590 Premature separation of placenta, unspecified, unspecified trimester: Principal | ICD-10-CM | POA: Diagnosis present

## 2013-12-19 LAB — ABO/RH: ABO/RH(D): O POS

## 2013-12-19 LAB — CBC
HEMATOCRIT: 32.7 % — AB (ref 36.0–46.0)
Hemoglobin: 11.2 g/dL — ABNORMAL LOW (ref 12.0–15.0)
MCH: 33 pg (ref 26.0–34.0)
MCHC: 34.3 g/dL (ref 30.0–36.0)
MCV: 96.5 fL (ref 78.0–100.0)
Platelets: 182 10*3/uL (ref 150–400)
RBC: 3.39 MIL/uL — ABNORMAL LOW (ref 3.87–5.11)
RDW: 13.4 % (ref 11.5–15.5)
WBC: 6.6 10*3/uL (ref 4.0–10.5)

## 2013-12-19 LAB — OB RESULTS CONSOLE GBS: STREP GROUP B AG: POSITIVE

## 2013-12-19 LAB — TYPE AND SCREEN
ABO/RH(D): O POS
Antibody Screen: NEGATIVE

## 2013-12-19 LAB — RPR

## 2013-12-19 MED ORDER — OXYCODONE-ACETAMINOPHEN 5-325 MG PO TABS
1.0000 | ORAL_TABLET | ORAL | Status: DC | PRN
  Administered 2013-12-19: 1 via ORAL

## 2013-12-19 MED ORDER — BENZOCAINE-MENTHOL 20-0.5 % EX AERO: 1.0000 "application " | INHALATION_SPRAY | CUTANEOUS | Status: DC | PRN

## 2013-12-19 MED ORDER — OXYTOCIN 40 UNITS IN LACTATED RINGERS INFUSION - SIMPLE MED
INTRAVENOUS | Status: AC
  Filled 2013-12-19: qty 1000

## 2013-12-19 MED ORDER — IBUPROFEN 600 MG PO TABS
600.0000 mg | ORAL_TABLET | Freq: Four times a day (QID) | ORAL | Status: DC | PRN
  Administered 2013-12-19 – 2013-12-20 (×2): 600 mg via ORAL
  Filled 2013-12-19: qty 1

## 2013-12-19 MED ORDER — ONDANSETRON HCL 4 MG/2ML IJ SOLN: 4.0000 mg | Freq: Four times a day (QID) | INTRAMUSCULAR | Status: DC | PRN

## 2013-12-19 MED ORDER — SODIUM CHLORIDE 0.9 % IV SOLN
2.0000 g | Freq: Once | INTRAVENOUS | Status: AC
  Administered 2013-12-19: 2 g via INTRAVENOUS
  Filled 2013-12-19: qty 2000

## 2013-12-19 MED ORDER — FERROUS SULFATE 325 (65 FE) MG PO TABS
325.0000 mg | ORAL_TABLET | Freq: Two times a day (BID) | ORAL | Status: DC
  Administered 2013-12-19 – 2013-12-21 (×4): 325 mg via ORAL
  Filled 2013-12-19 (×4): qty 1

## 2013-12-19 MED ORDER — SENNOSIDES-DOCUSATE SODIUM 8.6-50 MG PO TABS
2.0000 | ORAL_TABLET | ORAL | Status: DC
  Administered 2013-12-19 – 2013-12-21 (×2): 2 via ORAL
  Filled 2013-12-19 (×2): qty 2

## 2013-12-19 MED ORDER — OXYCODONE-ACETAMINOPHEN 5-325 MG PO TABS
1.0000 | ORAL_TABLET | ORAL | Status: DC | PRN
  Administered 2013-12-19: 1 via ORAL
  Filled 2013-12-19 (×2): qty 1

## 2013-12-19 MED ORDER — ONDANSETRON HCL 4 MG/2ML IJ SOLN: 4.0000 mg | INTRAMUSCULAR | Status: DC | PRN

## 2013-12-19 MED ORDER — SIMETHICONE 80 MG PO CHEW: 80.0000 mg | CHEWABLE_TABLET | ORAL | Status: DC | PRN

## 2013-12-19 MED ORDER — FLEET ENEMA 7-19 GM/118ML RE ENEM: 1.0000 | ENEMA | RECTAL | Status: DC | PRN

## 2013-12-19 MED ORDER — LIDOCAINE HCL (PF) 1 % IJ SOLN
INTRAMUSCULAR | Status: AC
  Filled 2013-12-19: qty 30

## 2013-12-19 MED ORDER — OXYTOCIN BOLUS FROM INFUSION: 500.0000 mL | INTRAVENOUS | Status: DC

## 2013-12-19 MED ORDER — IBUPROFEN 600 MG PO TABS
600.0000 mg | ORAL_TABLET | Freq: Four times a day (QID) | ORAL | Status: DC
  Administered 2013-12-19 – 2013-12-21 (×6): 600 mg via ORAL
  Filled 2013-12-19 (×8): qty 1

## 2013-12-19 MED ORDER — CITRIC ACID-SODIUM CITRATE 334-500 MG/5ML PO SOLN
30.0000 mL | ORAL | Status: DC | PRN
Start: 2013-12-19 — End: 2013-12-21

## 2013-12-19 MED ORDER — LIDOCAINE HCL (PF) 1 % IJ SOLN
30.0000 mL | INTRAMUSCULAR | Status: DC | PRN
Start: 2013-12-19 — End: 2013-12-21
  Filled 2013-12-19: qty 30

## 2013-12-19 MED ORDER — ZOLPIDEM TARTRATE 5 MG PO TABS: 5.0000 mg | ORAL_TABLET | Freq: Every evening | ORAL | Status: DC | PRN

## 2013-12-19 MED ORDER — LACTATED RINGERS IV SOLN: 500.0000 mL | INTRAVENOUS | Status: DC | PRN

## 2013-12-19 MED ORDER — DIBUCAINE 1 % RE OINT: 1.0000 "application " | TOPICAL_OINTMENT | RECTAL | Status: DC | PRN

## 2013-12-19 MED ORDER — ACETAMINOPHEN 325 MG PO TABS: 650.0000 mg | ORAL_TABLET | ORAL | Status: DC | PRN

## 2013-12-19 MED ORDER — PRENATAL MULTIVITAMIN CH
1.0000 | ORAL_TABLET | Freq: Every day | ORAL | Status: DC
  Administered 2013-12-19 – 2013-12-21 (×3): 1 via ORAL
  Filled 2013-12-19 (×3): qty 1

## 2013-12-19 MED ORDER — TETANUS-DIPHTH-ACELL PERTUSSIS 5-2.5-18.5 LF-MCG/0.5 IM SUSP
0.5000 mL | Freq: Once | INTRAMUSCULAR | Status: AC
  Administered 2013-12-20: 0.5 mL via INTRAMUSCULAR

## 2013-12-19 MED ORDER — LACTATED RINGERS IV SOLN
INTRAVENOUS | Status: DC
  Administered 2013-12-19: 07:00:00 via INTRAVENOUS

## 2013-12-19 MED ORDER — OXYTOCIN 40 UNITS IN LACTATED RINGERS INFUSION - SIMPLE MED
62.5000 mL/h | INTRAVENOUS | Status: DC
Start: 2013-12-19 — End: 2013-12-21
  Administered 2013-12-19: 999 mL/h via INTRAVENOUS

## 2013-12-19 MED ORDER — LANOLIN HYDROUS EX OINT: TOPICAL_OINTMENT | CUTANEOUS | Status: DC | PRN

## 2013-12-19 MED ORDER — WITCH HAZEL-GLYCERIN EX PADS: 1.0000 "application " | MEDICATED_PAD | CUTANEOUS | Status: DC | PRN

## 2013-12-19 MED ORDER — ONDANSETRON HCL 4 MG PO TABS
4.0000 mg | ORAL_TABLET | ORAL | Status: DC | PRN
Start: 2013-12-19 — End: 2013-12-21

## 2013-12-19 MED ORDER — DIPHENHYDRAMINE HCL 25 MG PO CAPS: 25.0000 mg | ORAL_CAPSULE | Freq: Four times a day (QID) | ORAL | Status: DC | PRN

## 2013-12-19 NOTE — H&P (Signed)
This is Dr. Francoise CeoBernard Marshall dictating the history and physical on  Marisa Stephens she's a 23 year old gravida 4 para 2012 at 35 weeks and 4 days EDC 724 positive GBS she got 1 dose of ampicillin the patient states she was contracting since yesterday and is now admitted fully dilated to labor and delivery amniotomy performed the fluids clear showed a normal vaginal delivery of a female 8 the team was in attendance and there was a significant amount of bleeding after delivery at appeared that was having an abruption the placenta placenta was sent to pathology Past surgical history negative Past medical history negative System review noncontributory Physical well-developed female post delivery HEENT negative Lungs clear to P&A Heart regular rhythm no murmurs or gallops Abdomen uterus 20 week size postpartum Pelvic deferred Extremities negative

## 2013-12-19 NOTE — Lactation Note (Signed)
This note was copied from the chart of Marisa Stephens Pardi. Lactation Consultation Note Initial lactation visit done.  Providing Breastmilk for your Baby in NICU given to patient.  RN has initiated pumping and she is obtaining 10-20 mls  of colostrum.  Mom chooses to pump and bottle feed and not put the baby to breast.  [facility] Patient Name: Marisa Stephens Settlemire ZOXWR'UToday's Date: 12/19/2013 Reason for consult: Initial assessment;NICU baby   Maternal Data    Feeding    LATCH Score/Interventions                      Lactation Tools Discussed/Used Pump Review: Setup, frequency, and cleaning;Milk Storage Initiated by:: RN Date initiated:: 12/20/13   Consult Status      Hansel Feinsteinowell, Laura Ann 12/19/2013, 3:28 PM

## 2013-12-19 NOTE — MAU Note (Signed)
UC's for several hours. Arrived via EMS and to room #5.

## 2013-12-19 NOTE — Progress Notes (Signed)
UR completed 

## 2013-12-20 LAB — CBC
HCT: 30.4 % — ABNORMAL LOW (ref 36.0–46.0)
Hemoglobin: 10.3 g/dL — ABNORMAL LOW (ref 12.0–15.0)
MCH: 32.6 pg (ref 26.0–34.0)
MCHC: 33.9 g/dL (ref 30.0–36.0)
MCV: 96.2 fL (ref 78.0–100.0)
PLATELETS: 206 10*3/uL (ref 150–400)
RBC: 3.16 MIL/uL — ABNORMAL LOW (ref 3.87–5.11)
RDW: 13.4 % (ref 11.5–15.5)
WBC: 10.4 10*3/uL (ref 4.0–10.5)

## 2013-12-20 LAB — HIV ANTIBODY (ROUTINE TESTING W REFLEX): HIV: NONREACTIVE

## 2013-12-20 NOTE — Lactation Note (Signed)
This note was copied from the chart of Marisa Jaquelyn BitterMonique Maxham. Lactation Consultation Note     Follow up consult with this mom of a NICU baby, now 1026 hours old, and 35 5/7 weeks CGA. I was called by Cathlyn ParsonsNedra cox, [facility], and she asked that I have mom call, to have her appoinment changed to today, so she can get a DEP. I told mom to call, and will follow this family in the NICU.  Patient Name: Marisa Stephens ZOXWR'UToday's Date: 12/20/2013 Reason for consult: Follow-up assessment;NICU baby   Maternal Data    Feeding Feeding Type: Breast Milk with Formula added Nipple Type: Slow - flow Length of feed: 20 min  LATCH Score/Interventions                      Lactation Tools Discussed/Used WIC Program: Yes (mom was encouraged to call this morning, to see if she could change her appointment to tday, instead of friday, for a DEP on discharge.)   Consult Status Consult Status: PRN Follow-up type: In-patient (NICU)    Alfred LevinsLee, Christine Anne 12/20/2013, 12:08 PM

## 2013-12-20 NOTE — Progress Notes (Signed)
Patient ID: [name], female   DOB: [birth year], 23 y.o.   MRN: [id number] Postpartum day one Vital signs normal Fundus firm Lochia moderate Doing well

## 2013-12-21 NOTE — Discharge Instructions (Signed)
Discharge instructions   You can wash your hair  Shower  Eat what you want  Drink what you want  See me in 6 weeks  Your ankles are going to swell more in the next 2 weeks than when pregnant  No sex for 6 weeks   Teo Moede A, MD 12/21/2013

## 2013-12-21 NOTE — Progress Notes (Signed)
Clinical Social Work Department PSYCHOSOCIAL ASSESSMENT - MATERNAL/CHILD 12/21/2013  Patient:  Marisa Stephens, Marisa Stephens  Account Number:  [id number]  Admit Date:  12/19/2013  Ardine Eng Name:   Santiago Bumpers    Clinical Social Worker:  Terri Piedra, LCSW   Date/Time:  12/21/2013 08:49 AM  Date Referred:        Other referral source:   No referral-NICU admission    I:  FAMILY / [place] legal guardian:  PARENT  Guardian - Name Guardian - Age Guardian - Address  Merriel Zinger [address] [place], [place], [place] [address]  Santiago Bumpers 22 same   Other household support members/support persons Name Relationship DOB  [place] 7  Da'Sani Darral Dash 1   Other support:   Parents report having a great support system on both sides of the family.    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 Rosana Hoes at [facility].   IV  RISK FACTORS AND CURRENT PROBLEMS Current Problem:  YES   Risk Factor & Current Problem Patient Issue Family Issue Risk Factor / Current Problem Comment  Substance Abuse Y N MOB-hx marijuana   N N     V  SOCIAL WORK ASSESSMENT  CSW met with parents in MOB's third floor room to introduce myself, offer support and complete assessment due to NICU admission.  CSW also notes [place], with only 2 visits.  Parents were very  pleasant and welcomed CSW into the room.  They report doing well and state baby is also doing very well at this point.  They state NICU staff has kept them updated and they seem to have a good understanding of his medical situation.  They report good supports and having all supplies for baby at home.  This is the couple's second child together.  MOB has a 69 year old daughter who also lives in the home.  CSW asked about [facility] [place] and MOB states she had to wait on her Medicaid before being able to go to the doctor.  She stated that she also found on on the later side that she was pregnant.  She states she has Medicaid at this point.  CSW explained hospital drug screen policy and MOB was understanding.  CSW asked if she has any concerns that baby is being drug screened and MOB said quietly, "I might have smoked a blunt or two."  She states it has been a while since the last time she smoked.  FOB states he does not use marijuana and told MOB that she had to stop once they found out she was pregnant.  She states minimal use prior to pregnancy and none after finding out about the pregnancy.  CSW explained mandated reporting to CPS and what to expect if baby's MDS is positive.  Parents were understanding.  They state no hx with CPS.  CSW asked if  they have transportation to the hospital to visit with baby and they state they will most likely have to get a cab.  CSW offered bus passes if they will use them so they can save their money.  They accepted and were very appreciative.  CSW discussed PPD signs and symptoms.  MOB denies any hx after the births of her daughters and states she feels comfortable calling her doctor if symptoms arise.  CSW counseled parents on safe sleep and SIDS risks.  Parents were attentive and stated understanding.  CSW has no further questions and identifies no barriers to discharge when MOB and baby are medically ready.  Parents state no questions or further needs at this time and thanked CSW.   VI  SOCIAL WORK PLAN Social Work Plan  Psychosocial Support/Ongoing Assessment of Needs  Patient/Family Education   Type of pt/family education:   Ongoing support services offered by NICU CSW  PPD signs and symptoms  Safe sleep  Hospital drug screen policy   If child protective services report - county:   If child protective services report - date:   Information/referral to community resources comment:   Retail banker   Other social work plan:   UDS negative.  CSW will monitor MDS result.

## 2013-12-21 NOTE — Discharge Summary (Signed)
Obstetric Discharge Summary Reason for Admission: onset of labor Prenatal Procedures: none Intrapartum Procedures: spontaneous vaginal delivery Postpartum Procedures: none Complications-Operative and Postpartum: none Hemoglobin  Date Value Ref Range Status  12/20/2013 10.3* 12.0 - 15.0 g/dL Final     HCT  Date Value Ref Range Status  12/20/2013 30.4* 36.0 - 46.0 % Final    Physical Exam:  General: alert Lochia: appropriate Uterine Fundus: firm Incision: healing well DVT Evaluation: No evidence of DVT seen on physical exam.  Discharge Diagnoses: Term Pregnancy-delivered  Discharge Information: Date: 12/21/2013 Activity: pelvic rest Diet: routine Medications: Percocet Condition: stable Instructions: refer to practice specific booklet Discharge to: home Follow-up Information   Follow up with MARSHALL,BERNARD A, MD In 6 weeks.   Specialty:  Obstetrics and Gynecology   Contact information:   [address] [place] [id number] [phone]       Newborn Data: Live born female  Birth Weight: 4 lb 9 oz (2070 g) APGAR: 8, 8  Home with mother.  MARSHALL,BERNARD A 12/21/2013, 6:35 AM

## 2013-12-21 NOTE — Lactation Note (Signed)
This note was copied from the chart of Marisa Stephens. Lactation Consultation Note    Mom is being discharged to home today, [facility]. I advsied mom to pump in standard setting now, for 15-30 minutes, or until she stops dripping. Mom knows lactation will be available to her during her baby's NICU stay, and after.  Patient Name: Marisa Stephens MVHQI'OToday's Date: 12/21/2013 Reason for consult: Follow-up assessment;NICU baby   Maternal Data    Feeding    LATCH Score/Interventions                      Lactation Tools Discussed/Used Pump Review: Setup, frequency, and cleaning   Consult Status Consult Status: PRN Follow-up type: In-patient    Alfred LevinsLee, Mateja Dier Anne 12/21/2013, 11:12 AM

## 2013-12-21 NOTE — Progress Notes (Signed)
Pt is discharged in the care of friend. Downstairs per ambulatory, spirits are good. Infant to remain in nicu.Discharge instructions with Rx were given to pt. Questions were asked. No equipment needed for home.

## 2014-04-30 ENCOUNTER — Encounter (HOSPITAL_COMMUNITY): Payer: Self-pay | Admitting: *Deleted

## 2014-06-27 ENCOUNTER — Emergency Department (HOSPITAL_COMMUNITY)
Admission: EM | Admit: 2014-06-27 | Discharge: 2014-06-27 | Disposition: A | Discharge status: Home / Self Care | Payer: Medicaid Other | Attending: Emergency Medicine | Admitting: Emergency Medicine

## 2014-06-27 ENCOUNTER — Encounter (HOSPITAL_COMMUNITY): Payer: Self-pay | Admitting: Nurse Practitioner

## 2014-06-27 ENCOUNTER — Emergency Department (HOSPITAL_COMMUNITY): Payer: Medicaid Other

## 2014-06-27 DIAGNOSIS — N390 Urinary tract infection, site not specified: Secondary | ICD-10-CM

## 2014-06-27 DIAGNOSIS — O2331 Infections of other parts of urinary tract in pregnancy, first trimester: Secondary | ICD-10-CM | POA: Insufficient documentation

## 2014-06-27 DIAGNOSIS — Z8541 Personal history of malignant neoplasm of cervix uteri: Secondary | ICD-10-CM | POA: Diagnosis not present

## 2014-06-27 DIAGNOSIS — Z3A09 9 weeks gestation of pregnancy: Secondary | ICD-10-CM | POA: Diagnosis not present

## 2014-06-27 DIAGNOSIS — O99331 Smoking (tobacco) complicating pregnancy, first trimester: Secondary | ICD-10-CM | POA: Diagnosis not present

## 2014-06-27 DIAGNOSIS — F17210 Nicotine dependence, cigarettes, uncomplicated: Secondary | ICD-10-CM | POA: Insufficient documentation

## 2014-06-27 DIAGNOSIS — O9989 Other specified diseases and conditions complicating pregnancy, childbirth and the puerperium: Secondary | ICD-10-CM | POA: Diagnosis present

## 2014-06-27 DIAGNOSIS — R52 Pain, unspecified: Secondary | ICD-10-CM

## 2014-06-27 DIAGNOSIS — Z3490 Encounter for supervision of normal pregnancy, unspecified, unspecified trimester: Secondary | ICD-10-CM

## 2014-06-27 LAB — CBC WITH DIFFERENTIAL/PLATELET
Basophils Absolute: 0 10*3/uL (ref 0.0–0.1)
Basophils Relative: 0 % (ref 0–1)
Eosinophils Absolute: 0.1 10*3/uL (ref 0.0–0.7)
Eosinophils Relative: 1 % (ref 0–5)
HCT: 37.7 % (ref 36.0–46.0)
Hemoglobin: 13 g/dL (ref 12.0–15.0)
LYMPHS ABS: 1.8 10*3/uL (ref 0.7–4.0)
Lymphocytes Relative: 41 % (ref 12–46)
MCH: 31.7 pg (ref 26.0–34.0)
MCHC: 34.5 g/dL (ref 30.0–36.0)
MCV: 92 fL (ref 78.0–100.0)
MONOS PCT: 10 % (ref 3–12)
Monocytes Absolute: 0.4 10*3/uL (ref 0.1–1.0)
NEUTROS PCT: 48 % (ref 43–77)
Neutro Abs: 2.1 10*3/uL (ref 1.7–7.7)
Platelets: 338 10*3/uL (ref 150–400)
RBC: 4.1 MIL/uL (ref 3.87–5.11)
RDW: 12.1 % (ref 11.5–15.5)
WBC: 4.3 10*3/uL (ref 4.0–10.5)

## 2014-06-27 LAB — URINE MICROSCOPIC-ADD ON

## 2014-06-27 LAB — COMPREHENSIVE METABOLIC PANEL
ALT: 22 U/L (ref 0–35)
AST: 25 U/L (ref 0–37)
Albumin: 4.1 g/dL (ref 3.5–5.2)
Alkaline Phosphatase: 75 U/L (ref 39–117)
Anion gap: 8 (ref 5–15)
BUN: 9 mg/dL (ref 6–23)
CALCIUM: 9.6 mg/dL (ref 8.4–10.5)
CO2: 24 mmol/L (ref 19–32)
Chloride: 102 mEq/L (ref 96–112)
Creatinine, Ser: 0.73 mg/dL (ref 0.50–1.10)
GFR calc Af Amer: >90 mL/min (ref >90)
GFR calc non Af Amer: >90 mL/min (ref >90)
Glucose, Bld: 104 mg/dL — ABNORMAL HIGH (ref 70–99)
Potassium: 3 mmol/L — ABNORMAL LOW (ref 3.5–5.1)
SODIUM: 134 mmol/L — AB (ref 135–145)
TOTAL PROTEIN: 7.6 g/dL (ref 6.0–8.3)
Total Bilirubin: 1.4 mg/dL — ABNORMAL HIGH (ref 0.3–1.2)

## 2014-06-27 LAB — PREGNANCY, URINE: PREG TEST UR: POSITIVE — AB

## 2014-06-27 LAB — URINALYSIS, ROUTINE W REFLEX MICROSCOPIC
Bilirubin Urine: NEGATIVE
GLUCOSE, UA: NEGATIVE mg/dL
Ketones, ur: 15 mg/dL — AB
Nitrite: POSITIVE — AB
PH: 6 (ref 5.0–8.0)
Protein, ur: 30 mg/dL — AB
Specific Gravity, Urine: 1.033 — ABNORMAL HIGH (ref 1.005–1.030)
Urobilinogen, UA: 1 mg/dL (ref 0.0–1.0)

## 2014-06-27 LAB — POC URINE PREG, ED: PREG TEST UR: POSITIVE — AB

## 2014-06-27 LAB — ABO/RH: ABO/RH(D): O POS

## 2014-06-27 LAB — HCG, QUANTITATIVE, PREGNANCY: HCG, BETA CHAIN, QUANT, S: 143098 m[IU]/mL — AB (ref <5)

## 2014-06-27 MED ORDER — CEPHALEXIN 500 MG PO CAPS: 500.0000 mg | ORAL_CAPSULE | Freq: Four times a day (QID) | ORAL | Status: DC

## 2014-06-27 MED ORDER — POTASSIUM CHLORIDE CRYS ER 20 MEQ PO TBCR
40.0000 meq | EXTENDED_RELEASE_TABLET | Freq: Once | ORAL | Status: AC
  Administered 2014-06-27: 40 meq via ORAL
  Filled 2014-06-27: qty 2

## 2014-06-27 MED ORDER — CEPHALEXIN 250 MG PO CAPS
500.0000 mg | ORAL_CAPSULE | Freq: Once | ORAL | Status: AC
  Administered 2014-06-27: 500 mg via ORAL
  Filled 2014-06-27: qty 2

## 2014-06-27 NOTE — ED Notes (Signed)
Patient placed in room. 

## 2014-06-27 NOTE — ED Notes (Signed)
Patient transported to Ultrasound 

## 2014-06-27 NOTE — Discharge Instructions (Signed)
It was our pleasure to provide your ER care today - we hope that you feel better.  Rest. Drink plenty of fluids. Avoid smoking.   Your ultrasound shows an approximately 8 week pregnancy.  Follow up with ob/gyn doctor in the next 1-2 weeks - see referral - call office to arrange appointment.  Your lab tests show a probable urine infection - take antibiotic (keflex) as prescribed.  A urine culture was sent the results of which will be back in 2 days time, have your doctor follow up on that result then.  Go directly to [facility] if vaginal bleeding, pelvic pain, or other pregnancy related concern.  Return to ER if worse, high fevers, persistent vomiting, other concern.      First Trimester of Pregnancy The first trimester of pregnancy is from week 1 until the end of week 12 (months 1 through 3). A week after a sperm fertilizes an egg, the egg will implant on the wall of the uterus. This embryo will begin to develop into a baby. Genes from you and your partner are forming the baby. The female genes determine whether the baby is a boy or a girl. At 6-8 weeks, the eyes and face are formed, and the heartbeat can be seen on ultrasound. At the end of 12 weeks, all the baby's organs are formed.  Now that you are pregnant, you will want to do everything you can to have a healthy baby. Two of the most important things are to get good prenatal care and to follow your health care provider's instructions. Prenatal care is all the medical care you receive before the baby's birth. This care will help prevent, find, and treat any problems during the pregnancy and childbirth. BODY CHANGES Your body goes through many changes during pregnancy. The changes vary from woman to woman.   You may gain or lose a couple of pounds at first.  You may feel sick to your stomach (nauseous) and throw up (vomit). If the vomiting is uncontrollable, call your health care provider.  You may tire easily.  You may develop  headaches that can be relieved by medicines approved by your health care provider.  You may urinate more often. Painful urination may mean you have a bladder infection.  You may develop heartburn as a result of your pregnancy.  You may develop constipation because certain hormones are causing the muscles that push waste through your intestines to slow down.  You may develop hemorrhoids or swollen, bulging veins (varicose veins).  Your breasts may begin to grow larger and become tender. Your nipples may stick out more, and the tissue that surrounds them (areola) may become darker.  Your gums may bleed and may be sensitive to brushing and flossing.  Dark spots or blotches (chloasma, mask of pregnancy) may develop on your face. This will likely fade after the baby is born.  Your menstrual periods will stop.  You may have a loss of appetite.  You may develop cravings for certain kinds of food.  You may have changes in your emotions from day to day, such as being excited to be pregnant or being concerned that something may go wrong with the pregnancy and baby.  You may have more vivid and strange dreams.  You may have changes in your hair. These can include thickening of your hair, rapid growth, and changes in texture. Some women also have hair loss during or after pregnancy, or hair that feels dry or thin. Your hair  will most likely return to normal after your baby is born. WHAT TO EXPECT AT YOUR PRENATAL VISITS During a routine prenatal visit:  You will be weighed to make sure you and the baby are growing normally.  Your blood pressure will be taken.  Your abdomen will be measured to track your baby's growth.  The fetal heartbeat will be listened to starting around week 10 or 12 of your pregnancy.  Test results from any previous visits will be discussed. Your health care provider may ask you:  How you are feeling.  If you are feeling the baby move.  If you have had any  abnormal symptoms, such as leaking fluid, bleeding, severe headaches, or abdominal cramping.  If you have any questions. Other tests that may be performed during your first trimester include:  Blood tests to find your blood type and to check for the presence of any previous infections. They will also be used to check for low iron levels (anemia) and Rh antibodies. Later in the pregnancy, blood tests for diabetes will be done along with other tests if problems develop.  Urine tests to check for infections, diabetes, or protein in the urine.  An ultrasound to confirm the proper growth and development of the baby.  An amniocentesis to check for possible genetic problems.  Fetal screens for spina bifida and Down syndrome.  You may need other tests to make sure you and the baby are doing well. HOME CARE INSTRUCTIONS  Medicines  Follow your health care provider's instructions regarding medicine use. Specific medicines may be either safe or unsafe to take during pregnancy.  Take your prenatal vitamins as directed.  If you develop constipation, try taking a stool softener if your health care provider approves. Diet  Eat regular, well-balanced meals. Choose a variety of foods, such as meat or vegetable-based protein, fish, milk and low-fat dairy products, vegetables, fruits, and whole grain breads and cereals. Your health care provider will help you determine the amount of weight gain that is right for you.  Avoid raw meat and uncooked cheese. These carry germs that can cause birth defects in the baby.  Eating four or five small meals rather than three large meals a day may help relieve nausea and vomiting. If you start to feel nauseous, eating a few soda crackers can be helpful. Drinking liquids between meals instead of during meals also seems to help nausea and vomiting.  If you develop constipation, eat more high-fiber foods, such as fresh vegetables or fruit and whole grains. Drink enough  fluids to keep your urine clear or pale yellow. Activity and Exercise  Exercise only as directed by your health care provider. Exercising will help you:  Control your weight.  Stay in shape.  Be prepared for labor and delivery.  Experiencing pain or cramping in the lower abdomen or low back is a good sign that you should stop exercising. Check with your health care provider before continuing normal exercises.  Try to avoid standing for long periods of time. Move your legs often if you must stand in one place for a long time.  Avoid heavy lifting.  Wear low-heeled shoes, and practice good posture.  You may continue to have sex unless your health care provider directs you otherwise. Relief of Pain or Discomfort  Wear a good support bra for breast tenderness.   Take warm sitz baths to soothe any pain or discomfort caused by hemorrhoids. Use hemorrhoid cream if your health care provider approves.  Rest with your legs elevated if you have leg cramps or low back pain.  If you develop varicose veins in your legs, wear support hose. Elevate your feet for 15 minutes, 3-4 times a day. Limit salt in your diet. Prenatal Care  Schedule your prenatal visits by the twelfth week of pregnancy. They are usually scheduled monthly at first, then more often in the last 2 months before delivery.  Write down your questions. Take them to your prenatal visits.  Keep all your prenatal visits as directed by your health care provider. Safety  Wear your seat belt at all times when driving.  Make a list of emergency phone numbers, including numbers for family, friends, the hospital, and police and fire departments. General Tips  Ask your health care provider for a referral to a local prenatal education class. Begin classes no later than at the beginning of month 6 of your pregnancy.  Ask for help if you have counseling or nutritional needs during pregnancy. Your health care provider can offer  advice or refer you to specialists for help with various needs.  Do not use hot tubs, steam rooms, or saunas.  Do not douche or use tampons or scented sanitary pads.  Do not cross your legs for long periods of time.  Avoid cat litter boxes and soil used by cats. These carry germs that can cause birth defects in the baby and possibly loss of the fetus by miscarriage or stillbirth.  Avoid all smoking, herbs, alcohol, and medicines not prescribed by your health care provider. Chemicals in these affect the formation and growth of the baby.  Schedule a dentist appointment. At home, brush your teeth with a soft toothbrush and be gentle when you floss. SEEK MEDICAL CARE IF:   You have dizziness.  You have mild pelvic cramps, pelvic pressure, or nagging pain in the abdominal area.  You have persistent nausea, vomiting, or diarrhea.  You have a bad smelling vaginal discharge.  You have pain with urination.  You notice increased swelling in your face, hands, legs, or ankles. SEEK IMMEDIATE MEDICAL CARE IF:   You have a fever.  You are leaking fluid from your vagina.  You have spotting or bleeding from your vagina.  You have severe abdominal cramping or pain.  You have rapid weight gain or loss.  You vomit blood or material that looks like coffee grounds.  You are exposed to [place] measles and have never had them.  You are exposed to fifth disease or chickenpox.  You develop a severe headache.  You have shortness of breath.  You have any kind of trauma, such as from a fall or a car accident. Document Released: 06/09/2001 Document Revised: 10/30/2013 Document Reviewed: 04/25/2013 [facility] Patient Information 2015 [place], [place]. This information is not intended to replace advice given to you by your health care provider. Make sure you discuss any questions you have with your health care provider.    Pregnancy and Smoking Smoking during pregnancy is unhealthy for you and  your developing baby. The addictive drug nicotine, carbon monoxide, and many other poisons are inhaled from a cigarette and carried through your bloodstream to your baby. Cigarette smoke contains more than 2,500 chemicals. It is not known which of these are harmful to a developing baby. However, both nicotine and carbon monoxide play a role in causing health problems in pregnancy. Smoking during pregnancy increases the risk of:  Birth defects in your baby, including heart defects.  Miscarriage and stillbirth.  Birth before 37 completed weeks of pregnancy (premature birth).  Pregnancy outside of the uterus (tubal pregnancy).  Attachment of the placenta over the opening of the uterus (placenta previa).  Detachment of the placenta before the baby's birth (placental abruption).  Breaking of the bag of waters before labor begins (premature rupture of membranes). HOW DOES SMOKING DURING PREGNANCY AFFECT MY BABY? Before Birth Smoking during pregnancy:  Decreases blood flow and oxygen to your baby.  Increases the heart rate of your baby.  Slows your baby's growth in the uterus (intrauterine growth retardation). After Birth Babies born to women who smoke during pregnancy are more likely to have a low birth weight. They are also at risk for:  Serious health problems, chronic or lifelong disabilities (cerebral palsy, mental retardation, learning problems), and death.  Sudden infant death syndrome (SIDS).  Lung and breathing problems. WHAT RESOURCES ARE AVAILABLE TO HELP ME STOP SMOKING?  Ask your health care provider for help to stop smoking. The following resources are available:  Counseling.  Psychological treatment.  Acupuncture.  Family intervention.  Hypnosis.  Nicotine supplements have not been studied enough to know if they are safe to use during pregnancy. They should only be considered when all other methods fail, and if used under the close supervision of your health care  provider.  Telephone QUIT lines. The national smoking cessation telephone hotline number is 1-800-QUIT NOW. FOR MORE INFORMATION  American Cancer Society: www.cancer.org  American Heart Association: www.heart.org  National Cancer Institute: www.cancer.gov  March of Dimes: www.marchofdimes.org Document Released: 10/27/2004 Document Revised: 06/20/2013 Document Reviewed: 05/15/2013 [facility] Patient Information 2015 [place], [place]. This information is not intended to replace advice given to you by your health care provider. Make sure you discuss any questions you have with your health care provider.    Urinary Tract Infection Urinary tract infections (UTIs) can develop anywhere along your urinary tract. Your urinary tract is your body's drainage system for removing wastes and extra water. Your urinary tract includes two kidneys, two ureters, a bladder, and a urethra. Your kidneys are a pair of bean-shaped organs. Each kidney is about the size of your fist. They are located below your ribs, one on each side of your spine. CAUSES Infections are caused by microbes, which are microscopic organisms, including fungi, viruses, and bacteria. These organisms are so small that they can only be seen through a microscope. Bacteria are the microbes that most commonly cause UTIs. SYMPTOMS  Symptoms of UTIs may vary by age and gender of the patient and by the location of the infection. Symptoms in young women typically include a frequent and intense urge to urinate and a painful, burning feeling in the bladder or urethra during urination. Older women and men are more likely to be tired, shaky, and weak and have muscle aches and abdominal pain. A fever may mean the infection is in your kidneys. Other symptoms of a kidney infection include pain in your back or sides below the ribs, nausea, and vomiting. DIAGNOSIS To diagnose a UTI, your caregiver will ask you about your symptoms. Your caregiver also will ask to  provide a urine sample. The urine sample will be tested for bacteria and white blood cells. White blood cells are made by your body to help fight infection. TREATMENT  Typically, UTIs can be treated with medication. Because most UTIs are caused by a bacterial infection, they usually can be treated with the use of antibiotics. The choice of antibiotic and length of treatment depend on  your symptoms and the type of bacteria causing your infection. HOME CARE INSTRUCTIONS  If you were prescribed antibiotics, take them exactly as your caregiver instructs you. Finish the medication even if you feel better after you have only taken some of the medication.  Drink enough water and fluids to keep your urine clear or pale yellow.  Avoid caffeine, tea, and carbonated beverages. They tend to irritate your bladder.  Empty your bladder often. Avoid holding urine for long periods of time.  Empty your bladder before and after sexual intercourse.  After a bowel movement, women should cleanse from front to back. Use each tissue only once. SEEK MEDICAL CARE IF:   You have back pain.  You develop a fever.  Your symptoms do not begin to resolve within 3 days. SEEK IMMEDIATE MEDICAL CARE IF:   You have severe back pain or lower abdominal pain.  You develop chills.  You have nausea or vomiting.  You have continued burning or discomfort with urination. MAKE SURE YOU:   Understand these instructions.  Will watch your condition.  Will get help right away if you are not doing well or get worse. Document Released: 03/25/2005 Document Revised: 12/15/2011 Document Reviewed: 07/24/2011 [facility] Patient Information 2015 [place], [place]. This information is not intended to replace advice given to you by your health care provider. Make sure you discuss any questions you have with your health care provider.

## 2014-06-27 NOTE — ED Notes (Signed)
Pt was told she had cervical cancer 6 months ago when her child was born but she was unable to f/u for treatment because she lost her insurance. For 2 days she has been having n/v/pelvic pain. Denies any vaginal discharge or bleeding. She had some episodes of diarrhea yesterday

## 2014-06-27 NOTE — ED Notes (Signed)
PT states she has diarrhea and stomach pain but eating a salad

## 2014-06-27 NOTE — ED Provider Notes (Signed)
CSN: [id number]     Arrival date & time 06/27/14  1252 History   First MD Initiated Contact with Patient 06/27/14 1314     Chief Complaint  Patient presents with  . Abdominal Pain     (Consider location/radiation/quality/duration/timing/severity/associated sxs/prior Treatment) Patient is a 23 y.o. female presenting with abdominal pain. The history is provided by the patient.  Abdominal Pain Associated symptoms: diarrhea, dysuria and vomiting   Associated symptoms: no chest pain, no chills, no fever, no shortness of breath, no sore throat, no vaginal bleeding and no vaginal discharge   pt c/o abdominal cramping, and nvd the past couple days. Cramping, mild, diffuse, intermittent. No specific exacerbating or alleviating factors.  Few episodes of both vomiting and diarrhea. Emesis not bloody or bilious. Diarrhea watery. No recent abx use or ill contacts. No known ill contacts. Pt also notes mild dysuria in the past day.  No flank pain. No vaginal discharge or bleeding. lnmp 2 months ago/'middle of October', no birth control. No fever or chills. No cough or uri c/o.     Past Medical History  Diagnosis Date  . No pertinent past medical history   . Medical history non-contributory   . Cervical cancer    Past Surgical History  Procedure Laterality Date  . No past surgeries     Family History  Problem Relation Age of Onset  . Diabetes Maternal Grandmother    History  Substance Use Topics  . Smoking status: Current Every Day Smoker -- 0.25 packs/day for 5 years    Types: Cigarettes  . Smokeless tobacco: Never Used  . Alcohol Use: No   OB History    Gravida Para Term Preterm AB TAB SAB Ectopic Multiple Living   4 3 2 1 1  1   3      Review of Systems  Constitutional: Negative for fever and chills.  HENT: Negative for sore throat.   Eyes: Negative for redness.  Respiratory: Negative for shortness of breath.   Cardiovascular: Negative for chest pain.  Gastrointestinal: Positive  for vomiting, abdominal pain and diarrhea.  Endocrine: Negative for polyuria.  Genitourinary: Positive for dysuria. Negative for flank pain, vaginal bleeding and vaginal discharge.  Musculoskeletal: Negative for back pain and neck pain.  Skin: Negative for rash.  Neurological: Negative for headaches.  Hematological: Does not bruise/bleed easily.  Psychiatric/Behavioral: Negative for confusion.      Allergies  Review of patient's allergies indicates no known allergies.  Home Medications   Prior to Admission medications   Not on File   BP 126/69 mmHg  Pulse 93  Temp(Src) 98 F (36.7 C) (Oral)  Resp 18  Ht 5\' 3"  (1.6 m)  Wt 120 lb (54.432 kg)  BMI 21.26 kg/m2  SpO2 100%  LMP 04/23/2014 Physical Exam  Constitutional: She is oriented to person, place, and time. She appears well-developed and well-nourished. No distress.  HENT:  Mouth/Throat: Oropharynx is clear and moist.  Eyes: Conjunctivae are normal. No scleral icterus.  Neck: Neck supple. No tracheal deviation present.  Cardiovascular: Normal rate, regular rhythm, normal heart sounds and intact distal pulses.   Pulmonary/Chest: Effort normal and breath sounds normal. No respiratory distress.  Abdominal: Soft. Normal appearance and bowel sounds are normal. She exhibits no distension and no mass. There is no tenderness. There is no rebound and no guarding.  Genitourinary:  No cva tenderness  Musculoskeletal: She exhibits no edema or tenderness.  Neurological: She is alert and oriented to person, place, and time.  Skin: Skin is warm and dry. No rash noted.  Psychiatric: She has a normal mood and affect.  Nursing note and vitals reviewed.   ED Course  Procedures (including critical care time) Labs Review  Results for orders placed or performed during the hospital encounter of 06/27/14  Comprehensive metabolic panel  Result Value Ref Range   Sodium 134 (L) 135 - 145 mmol/L   Potassium 3.0 (L) 3.5 - 5.1 mmol/L    Chloride 102 96 - 112 mEq/L   CO2 24 19 - 32 mmol/L   Glucose, Bld 104 (H) 70 - 99 mg/dL   BUN 9 6 - 23 mg/dL   Creatinine, Ser 1.610.73 0.50 - 1.10 mg/dL   Calcium 9.6 8.4 - 09.610.5 mg/dL   Total Protein 7.6 6.0 - 8.3 g/dL   Albumin 4.1 3.5 - 5.2 g/dL   AST 25 0 - 37 U/L   ALT 22 0 - 35 U/L   Alkaline Phosphatase 75 39 - 117 U/L   Total Bilirubin 1.4 (H) 0.3 - 1.2 mg/dL   GFR calc non Af Amer >90 >90 mL/min   GFR calc Af Amer >90 >90 mL/min   Anion gap 8 5 - 15  CBC with Differential  Result Value Ref Range   WBC 4.3 4.0 - 10.5 K/uL   RBC 4.10 3.87 - 5.11 MIL/uL   Hemoglobin 13.0 12.0 - 15.0 g/dL   HCT 04.537.7 40.936.0 - 81.146.0 %   MCV 92.0 78.0 - 100.0 fL   MCH 31.7 26.0 - 34.0 pg   MCHC 34.5 30.0 - 36.0 g/dL   RDW 91.412.1 78.211.5 - 95.615.5 %   Platelets 338 150 - 400 K/uL   Neutrophils Relative % 48 43 - 77 %   Neutro Abs 2.1 1.7 - 7.7 K/uL   Lymphocytes Relative 41 12 - 46 %   Lymphs Abs 1.8 0.7 - 4.0 K/uL   Monocytes Relative 10 3 - 12 %   Monocytes Absolute 0.4 0.1 - 1.0 K/uL   Eosinophils Relative 1 0 - 5 %   Eosinophils Absolute 0.1 0.0 - 0.7 K/uL   Basophils Relative 0 0 - 1 %   Basophils Absolute 0.0 0.0 - 0.1 K/uL  Urinalysis, Routine w reflex microscopic  Result Value Ref Range   Color, Urine AMBER (A) YELLOW   APPearance CLOUDY (A) CLEAR   Specific Gravity, Urine 1.033 (H) 1.005 - 1.030   pH 6.0 5.0 - 8.0   Glucose, UA NEGATIVE NEGATIVE mg/dL   Hgb urine dipstick SMALL (A) NEGATIVE   Bilirubin Urine NEGATIVE NEGATIVE   Ketones, ur 15 (A) NEGATIVE mg/dL   Protein, ur 30 (A) NEGATIVE mg/dL   Urobilinogen, UA 1.0 0.0 - 1.0 mg/dL   Nitrite POSITIVE (A) NEGATIVE   Leukocytes, UA MODERATE (A) NEGATIVE  Pregnancy, urine  Result Value Ref Range   Preg Test, Ur POSITIVE (A) NEGATIVE  hCG, quantitative, pregnancy  Result Value Ref Range   hCG, Beta Chain, Quant, S [id number] (H) <5 mIU/mL  Urine microscopic-add on  Result Value Ref Range   Squamous Epithelial / LPF MANY (A) RARE    WBC, UA TOO NUMEROUS TO COUNT <3 WBC/hpf   RBC / HPF 0-2 <3 RBC/hpf   Bacteria, UA MANY (A) RARE   Urine-Other MUCOUS PRESENT   POC Urine Pregnancy, ED  (If Pre-menopausal female) - do not order at [facility]  Result Value Ref Range   Preg Test, Ur POSITIVE (A) NEGATIVE  ABO/Rh  Result Value Ref Range  ABO/RH(D) O POS    No rh immune globuloin NOT A RH IMMUNE GLOBULIN CANDIDATE, PT RH POSITIVE    US Ob Comp Less 14 Wks  06/27/2014   CLINICAL DATA:  Pregnant patient with mid abdominal and back pain for 2 days. Question intrauterine versus ectopic pregnancy. Patient reports last menstrual period approximately mid October.  EXAM: OBSTETRIC <14 WK [place] AND TRANSVAGINAL OB US  TECHNIQUE: Both transabdominal and transvaginal ultrasound examinations were performed for complete evaluation of the gestation as well as the maternal uterus, adnexal regions, and pelvic cul-de-sac. Transvaginal technique was performed to assess early pregnancy.  COMPARISON:  None.  FINDINGS: Intrauterine gestational sac: Visualized/normal in shape.  Yolk sac:  Present, normal.  Embryo:  Single intrauterine pregnancy.  Cardiac Activity: Present and normal.  Heart Rate:  168 bpm  CRL:   19.1  mm   8 w 3 d                  [place] EDC: 02/03/2015  Maternal uterus/adnexae: Uterus is retroverted. Both ovaries are visualized and normal. Cervix measures 3.2 cm and is closed. No free fluid.  IMPRESSION: Single live intrauterine pregnancy best estimated date of delivery 02/03/2015. No complications.   Electronically Signed   By: Rubye Oaks M.D.   On: 06/27/2014 15:03   US Ob Transvaginal  06/27/2014   CLINICAL DATA:  Pregnant patient with mid abdominal and back pain for 2 days. Question intrauterine versus ectopic pregnancy. Patient reports last menstrual period approximately mid October.  EXAM: OBSTETRIC <14 WK [place] AND TRANSVAGINAL OB US  TECHNIQUE: Both transabdominal and transvaginal ultrasound examinations were performed for complete  evaluation of the gestation as well as the maternal uterus, adnexal regions, and pelvic cul-de-sac. Transvaginal technique was performed to assess early pregnancy.  COMPARISON:  None.  FINDINGS: Intrauterine gestational sac: Visualized/normal in shape.  Yolk sac:  Present, normal.  Embryo:  Single intrauterine pregnancy.  Cardiac Activity: Present and normal.  Heart Rate:  168 bpm  CRL:   19.1  mm   8 w 3 d                  [place] EDC: 02/03/2015  Maternal uterus/adnexae: Uterus is retroverted. Both ovaries are visualized and normal. Cervix measures 3.2 cm and is closed. No free fluid.  IMPRESSION: Single live intrauterine pregnancy best estimated date of delivery 02/03/2015. No complications.   Electronically Signed   By: Rubye Oaks M.D.   On: 06/27/2014 15:03       MDM  Labs.  Reviewed nursing notes and prior charts for additional history.   u preg positive.  Bhcg. Ultrasound.  uti on labs. Will rx. Confirmed nkda w pt.  Pt denies any abd/pelvic pain, no vaginal discharge or bleeding. U/s w 8 week iup.  Discussed w pt. Will have f/u closely w ob/gyn.  Pt tolerating po fluids, ate and drank during ed stay.  No nvd. No pain.  Pt appears stable for d/c.     Suzi Roots, MD 06/27/14 [id number]

## 2014-06-29 DIAGNOSIS — B951 Streptococcus, group B, as the cause of diseases classified elsewhere: Secondary | ICD-10-CM | POA: Insufficient documentation

## 2014-06-29 HISTORY — DX: Streptococcus, group b, as the cause of diseases classified elsewhere: B95.1

## 2014-06-29 NOTE — L&D Delivery Note (Signed)
Delivery Note At 6:29 AM a viable female was delivered via  (Presentation: LOA ;  ).  APGAR: 9-9 , ; weight: 2821 grams  .   Placenta status: Intact, .  Cord: 3-vessel with the following complications: none .  Cord pH: none  Anesthesia: none  Episiotomy: None Lacerations: none  Suture Repair: none Est. Blood Loss (mL): 350   Mom to postpartum.  Baby to Couplet care / Skin to Skin.  Jakalyn Kratky A 01/29/2015, 6:48 AM

## 2014-07-01 LAB — URINE CULTURE: Colony Count: >=100000

## 2014-07-02 ENCOUNTER — Telehealth (HOSPITAL_BASED_OUTPATIENT_CLINIC_OR_DEPARTMENT_OTHER): Payer: Self-pay | Admitting: Emergency Medicine

## 2014-07-02 NOTE — Telephone Encounter (Signed)
Post ED Visit - Positive Culture Follow-up  Culture report reviewed by antimicrobial stewardship pharmacist:  Wes Dulaney, Pharm.D., BCPS  Celedonio Miyamoto, Pharm.D., BCPS  Georgina Pillion, Pharm.D., BCPS  Todd [place], [address].D., BCPS, AAHIVP  Estella Husk, Pharm.D., BCPS, AAHIVP  Elder Cyphers, [address].D., BCPS  Positive urine culture Treated with Cephalexin, organism sensitive to the same and no further patient follow-up is required at this time.  Berle Mull 07/02/2014, 2:02 PM

## 2014-08-21 LAB — OB RESULTS CONSOLE ABO/RH: RH Type: POSITIVE

## 2014-08-21 LAB — OB RESULTS CONSOLE RUBELLA ANTIBODY, IGM: Rubella: IMMUNE

## 2014-08-21 LAB — OB RESULTS CONSOLE HIV ANTIBODY (ROUTINE TESTING): HIV: NONREACTIVE

## 2014-08-21 LAB — OB RESULTS CONSOLE ANTIBODY SCREEN: Antibody Screen: NEGATIVE

## 2014-08-21 LAB — OB RESULTS CONSOLE HEPATITIS B SURFACE ANTIGEN: Hepatitis B Surface Ag: NEGATIVE

## 2014-08-21 LAB — OB RESULTS CONSOLE RPR: RPR: NONREACTIVE

## 2014-08-21 LAB — OB RESULTS CONSOLE GC/CHLAMYDIA
Chlamydia: NEGATIVE
GC PROBE AMP, GENITAL: NEGATIVE

## 2014-10-01 ENCOUNTER — Other Ambulatory Visit (HOSPITAL_COMMUNITY): Payer: Self-pay | Admitting: Obstetrics

## 2014-10-01 DIAGNOSIS — Z3689 Encounter for other specified antenatal screening: Secondary | ICD-10-CM

## 2014-10-01 DIAGNOSIS — O4452 Low lying placenta with hemorrhage, second trimester: Secondary | ICD-10-CM

## 2014-10-16 ENCOUNTER — Encounter (HOSPITAL_COMMUNITY): Payer: Self-pay

## 2014-10-16 ENCOUNTER — Ambulatory Visit (HOSPITAL_COMMUNITY)
Admission: RE | Admit: 2014-10-16 | Discharge: 2014-10-16 | Disposition: A | Discharge status: Home / Self Care | Payer: Medicaid Other | Source: Ambulatory Visit | Attending: Obstetrics | Admitting: Obstetrics

## 2014-10-16 DIAGNOSIS — O09292 Supervision of pregnancy with other poor reproductive or obstetric history, second trimester: Secondary | ICD-10-CM | POA: Diagnosis not present

## 2014-10-16 DIAGNOSIS — Z36 Encounter for antenatal screening of mother: Secondary | ICD-10-CM | POA: Diagnosis not present

## 2014-10-16 DIAGNOSIS — O358XX0 Maternal care for other (suspected) fetal abnormality and damage, not applicable or unspecified: Secondary | ICD-10-CM | POA: Insufficient documentation

## 2014-10-16 DIAGNOSIS — Z3A24 24 weeks gestation of pregnancy: Secondary | ICD-10-CM | POA: Insufficient documentation

## 2014-10-16 DIAGNOSIS — Z3689 Encounter for other specified antenatal screening: Secondary | ICD-10-CM

## 2014-10-16 DIAGNOSIS — O4452 Low lying placenta with hemorrhage, second trimester: Secondary | ICD-10-CM

## 2014-10-16 DIAGNOSIS — O4402 Placenta previa specified as without hemorrhage, second trimester: Secondary | ICD-10-CM | POA: Diagnosis not present

## 2014-10-16 DIAGNOSIS — O35EXX0 Maternal care for other (suspected) fetal abnormality and damage, fetal genitourinary anomalies, not applicable or unspecified: Secondary | ICD-10-CM | POA: Insufficient documentation

## 2014-10-26 ENCOUNTER — Other Ambulatory Visit (HOSPITAL_COMMUNITY): Payer: Self-pay | Admitting: Maternal and Fetal Medicine

## 2014-10-26 DIAGNOSIS — O4400 Placenta previa specified as without hemorrhage, unspecified trimester: Secondary | ICD-10-CM

## 2014-10-26 DIAGNOSIS — Z8751 Personal history of pre-term labor: Secondary | ICD-10-CM

## 2014-11-27 ENCOUNTER — Ambulatory Visit (HOSPITAL_COMMUNITY)
Admission: RE | Admit: 2014-11-27 | Discharge: 2014-11-27 | Disposition: A | Discharge status: Home / Self Care | Payer: Medicaid Other | Source: Ambulatory Visit | Attending: Obstetrics | Admitting: Obstetrics

## 2014-11-27 ENCOUNTER — Encounter (HOSPITAL_COMMUNITY): Payer: Self-pay

## 2014-11-27 DIAGNOSIS — O4403 Placenta previa specified as without hemorrhage, third trimester: Secondary | ICD-10-CM | POA: Diagnosis not present

## 2014-11-27 DIAGNOSIS — Z3A30 30 weeks gestation of pregnancy: Secondary | ICD-10-CM | POA: Diagnosis not present

## 2014-11-27 DIAGNOSIS — O09213 Supervision of pregnancy with history of pre-term labor, third trimester: Secondary | ICD-10-CM | POA: Insufficient documentation

## 2014-11-27 DIAGNOSIS — O4400 Placenta previa specified as without hemorrhage, unspecified trimester: Secondary | ICD-10-CM

## 2014-11-27 DIAGNOSIS — Z8751 Personal history of pre-term labor: Secondary | ICD-10-CM

## 2014-12-06 ENCOUNTER — Encounter (HOSPITAL_COMMUNITY): Payer: Self-pay | Admitting: *Deleted

## 2014-12-06 ENCOUNTER — Inpatient Hospital Stay (HOSPITAL_COMMUNITY)
Admission: AD | Admit: 2014-12-06 | Discharge: 2014-12-07 | Disposition: A | Discharge status: Home / Self Care | Payer: Medicaid Other | Source: Ambulatory Visit | Attending: Obstetrics | Admitting: Obstetrics

## 2014-12-06 DIAGNOSIS — Z3A31 31 weeks gestation of pregnancy: Secondary | ICD-10-CM | POA: Insufficient documentation

## 2014-12-06 DIAGNOSIS — F1290 Cannabis use, unspecified, uncomplicated: Secondary | ICD-10-CM

## 2014-12-06 DIAGNOSIS — O4703 False labor before 37 completed weeks of gestation, third trimester: Secondary | ICD-10-CM | POA: Diagnosis not present

## 2014-12-06 DIAGNOSIS — O288 Other abnormal findings on antenatal screening of mother: Secondary | ICD-10-CM

## 2014-12-06 DIAGNOSIS — Z87891 Personal history of nicotine dependence: Secondary | ICD-10-CM | POA: Insufficient documentation

## 2014-12-06 DIAGNOSIS — R109 Unspecified abdominal pain: Secondary | ICD-10-CM | POA: Diagnosis present

## 2014-12-06 HISTORY — DX: Cannabis use, unspecified, uncomplicated: F12.90

## 2014-12-06 LAB — URINALYSIS, ROUTINE W REFLEX MICROSCOPIC
BILIRUBIN URINE: NEGATIVE
Glucose, UA: NEGATIVE mg/dL
Hgb urine dipstick: NEGATIVE
Ketones, ur: 40 mg/dL — AB
Nitrite: NEGATIVE
PROTEIN: NEGATIVE mg/dL
Specific Gravity, Urine: 1.02 (ref 1.005–1.030)
Urobilinogen, UA: 2 mg/dL — ABNORMAL HIGH (ref 0.0–1.0)
pH: 6 (ref 5.0–8.0)

## 2014-12-06 LAB — URINE MICROSCOPIC-ADD ON

## 2014-12-06 MED ORDER — LACTATED RINGERS IV BOLUS (SEPSIS)
1000.0000 mL | Freq: Once | INTRAVENOUS | Status: AC
  Administered 2014-12-07: 1000 mL via INTRAVENOUS

## 2014-12-06 MED ORDER — CYCLOBENZAPRINE HCL 10 MG PO TABS
10.0000 mg | ORAL_TABLET | Freq: Once | ORAL | Status: AC
  Administered 2014-12-07: 10 mg via ORAL
  Filled 2014-12-06: qty 1

## 2014-12-06 NOTE — MAU Provider Note (Signed)
History     CSN: [id number]  Arrival date and time: 12/06/14 2248   First Provider Initiated Contact with Patient 12/06/14 2338      Chief Complaint  Patient presents with  . Contractions   HPI Comments: Marisa Stephens is a 24 y.o. [phone] at [redacted]w[redacted]d who presents today with contractions. She states that the contractions started around 2230. She denies any VB or LOF. She states that the fetus has been moving normally. She had a preterm delivery with her last baby at 35 weeks. Next appointment with Dr. Gaynell Face on 12/13/14.   Abdominal Pain This is a new problem. The current episode started today (around 2230 ). The onset quality is sudden. The problem occurs intermittently. The problem has been unchanged. The pain is located in the suprapubic region. The pain is at a severity of 7/10. The quality of the pain is cramping. The abdominal pain does not radiate. Pertinent negatives include no constipation, diarrhea, dysuria, fever, frequency, nausea or vomiting. Nothing aggravates the pain. The pain is relieved by nothing. She has tried nothing for the symptoms.      Past Medical History  Diagnosis Date  . No pertinent past medical history   . Medical history non-contributory     Past Surgical History  Procedure Laterality Date  . No past surgeries      Family History  Problem Relation Age of Onset  . Diabetes Maternal Grandmother     History  Substance Use Topics  . Smoking status: Former Smoker -- 0.25 packs/day for 5 years    Types: Cigarettes  . Smokeless tobacco: Never Used  . Alcohol Use: No    Allergies: No Known Allergies  Prescriptions prior to admission  Medication Sig Dispense Refill Last Dose  . acetaminophen (TYLENOL) 500 MG tablet Take 1,000 mg by mouth every 6 (six) hours as needed for mild pain.   Past Week at Unknown time  . cephALEXin (KEFLEX) 500 MG capsule Take 1 capsule (500 mg total) by mouth 4 (four) times daily. 20 capsule 0   .  Pseudoeph-Doxylamine-DM-APAP (NYQUIL MULTI-SYMPTOM PO) Take 30 mLs by mouth at bedtime as needed (multi symptom).   06/26/2014 at Unknown time    Review of Systems  Constitutional: Negative for fever.  Gastrointestinal: Positive for abdominal pain. Negative for nausea, vomiting, diarrhea and constipation.  Genitourinary: Negative for dysuria, urgency and frequency.   Physical Exam   Blood pressure 116/62, pulse 84, temperature 98.5 F (36.9 C), resp. rate 18, height 5\' 2"  (1.575 m), weight 66.044 kg (145 lb 9.6 oz), last menstrual period 04/12/2014, SpO2 100 %, not currently breastfeeding.  Physical Exam  Nursing note and vitals reviewed. Constitutional: She is oriented to person, place, and time. She appears well-developed and well-nourished.  HENT:  Head: Normocephalic.  Cardiovascular: Normal rate.   Respiratory: Effort normal.  GI: Soft. There is no tenderness. There is no rebound.  Genitourinary:  Cervix: closed/thick/-3   Neurological: She is alert and oriented to person, place, and time.  Skin: Skin is warm and dry.  Psychiatric: She has a normal mood and affect.   Fht: 145, moderate, one 10x10 since being here. No decels [place]; irregular UCs. The stopped with IV fluids   Results for orders placed or performed during the hospital encounter of 12/06/14 (from the past 24 hour(s))  Urinalysis, Routine w reflex microscopic (not at [facility])     Status: Abnormal   Collection Time: 12/06/14 11:04 PM  Result Value Ref Range  Color, Urine YELLOW YELLOW   APPearance CLEAR CLEAR   Specific Gravity, Urine 1.020 1.005 - 1.030   pH 6.0 5.0 - 8.0   Glucose, UA NEGATIVE NEGATIVE mg/dL   Hgb urine dipstick NEGATIVE NEGATIVE   Bilirubin Urine NEGATIVE NEGATIVE   Ketones, ur 40 (A) NEGATIVE mg/dL   Protein, ur NEGATIVE NEGATIVE mg/dL   Urobilinogen, UA 2.0 (H) 0.0 - 1.0 mg/dL   Nitrite NEGATIVE NEGATIVE   Leukocytes, UA SMALL (A) NEGATIVE  Urine microscopic-add on     Status: Abnormal    Collection Time: 12/06/14 11:04 PM  Result Value Ref Range   Squamous Epithelial / LPF FEW (A) RARE   WBC, UA 7-10 <3 WBC/hpf   Bacteria, UA FEW (A) RARE   Urine-Other MUCOUS PRESENT   Urine rapid drug screen (hosp performed)not at [facility]     Status: Abnormal   Collection Time: 12/06/14 11:04 PM  Result Value Ref Range   Opiates NONE DETECTED NONE DETECTED   Cocaine NONE DETECTED NONE DETECTED   Benzodiazepines NONE DETECTED NONE DETECTED   Amphetamines NONE DETECTED NONE DETECTED   Tetrahydrocannabinol POSITIVE (A) NONE DETECTED   Barbiturates NONE DETECTED NONE DETECTED  Wet prep, genital     Status: Abnormal   Collection Time: 12/06/14 11:25 PM  Result Value Ref Range   Yeast Wet Prep HPF POC NONE SEEN NONE SEEN   Trich, Wet Prep NONE SEEN NONE SEEN   Clue Cells Wet Prep HPF POC FEW (A) NONE SEEN   WBC, Wet Prep HPF POC FEW (A) NONE SEEN    MAU Course  Procedures  MDM Patient has had 1L of LR. Contractions have stopped. Patient reports pain has improved. Will get BPP at this time.  BPP 8/8 0141: no cervical change, and patient reports pain has improved at this time.  Assessment and Plan   1. Threatened preterm labor, third trimester   2. Non-reactive NST (non-stress test)    DC home Comfort measures reviewed  3rd Trimester precautions  PTL precautions  Fetal kick counts RX: none  Return to MAU as needed FU with OB as planned  Follow-up Information    Follow up with Kathreen Cosier, MD.   Specialty:  Obstetrics and Gynecology   Why:  As scheduled   Contact information:   [address] [place] [address] [phone]         Tawnya Crook 12/06/2014, 11:39 PM

## 2014-12-06 NOTE — MAU Note (Signed)
Contractions started 30 minutes ago, has had 4 of them. Denies vaginal bleeding/LOF. Positive fetal movement.

## 2014-12-06 NOTE — MAU Note (Signed)
PO fluids given

## 2014-12-06 NOTE — MAU Note (Signed)
Pt states that she started contracting about 45 minutes ago. Pt states that baby is active. Denies LOF and bleeding.

## 2014-12-07 ENCOUNTER — Inpatient Hospital Stay (HOSPITAL_COMMUNITY): Payer: Medicaid Other

## 2014-12-07 DIAGNOSIS — O4703 False labor before 37 completed weeks of gestation, third trimester: Secondary | ICD-10-CM

## 2014-12-07 LAB — GC/CHLAMYDIA PROBE AMP (~~LOC~~) NOT AT ARMC
Chlamydia: NEGATIVE
NEISSERIA GONORRHEA: NEGATIVE

## 2014-12-07 LAB — RAPID URINE DRUG SCREEN, HOSP PERFORMED
Amphetamines: NOT DETECTED
Barbiturates: NOT DETECTED
Benzodiazepines: NOT DETECTED
Cocaine: NOT DETECTED
Opiates: NOT DETECTED
Tetrahydrocannabinol: POSITIVE — AB

## 2014-12-07 LAB — WET PREP, GENITAL
Trich, Wet Prep: NONE SEEN
Yeast Wet Prep HPF POC: NONE SEEN

## 2014-12-07 NOTE — Discharge Instructions (Signed)

## 2014-12-07 NOTE — MAU Note (Signed)
To u/s as patient has had some variables earlier on.

## 2014-12-27 LAB — OB RESULTS CONSOLE GBS: GBS: POSITIVE

## 2015-01-29 ENCOUNTER — Encounter (HOSPITAL_COMMUNITY): Payer: Self-pay | Admitting: *Deleted

## 2015-01-29 ENCOUNTER — Inpatient Hospital Stay (HOSPITAL_COMMUNITY)
Admission: AD | Admit: 2015-01-29 | Discharge: 2015-01-31 | DRG: 775 | Disposition: A | Discharge status: Home / Self Care | Payer: Medicaid Other | Source: Ambulatory Visit | Attending: Obstetrics | Admitting: Obstetrics

## 2015-01-29 DIAGNOSIS — Z833 Family history of diabetes mellitus: Secondary | ICD-10-CM

## 2015-01-29 DIAGNOSIS — Z3A00 Weeks of gestation of pregnancy not specified: Secondary | ICD-10-CM | POA: Diagnosis present

## 2015-01-29 DIAGNOSIS — Z87891 Personal history of nicotine dependence: Secondary | ICD-10-CM

## 2015-01-29 DIAGNOSIS — IMO0001 Reserved for inherently not codable concepts without codable children: Secondary | ICD-10-CM

## 2015-01-29 DIAGNOSIS — O99824 Streptococcus B carrier state complicating childbirth: Secondary | ICD-10-CM | POA: Diagnosis present

## 2015-01-29 HISTORY — DX: Streptococcus, group b, as the cause of diseases classified elsewhere: B95.1

## 2015-01-29 HISTORY — DX: Cannabis use, unspecified, uncomplicated: F12.90

## 2015-01-29 HISTORY — DX: Personal history of other infectious and parasitic diseases: Z86.19

## 2015-01-29 HISTORY — DX: Personal history of nicotine dependence: Z87.891

## 2015-01-29 LAB — TYPE AND SCREEN
ABO/RH(D): O POS
Antibody Screen: NEGATIVE

## 2015-01-29 LAB — CBC
HCT: 34.7 % — ABNORMAL LOW (ref 36.0–46.0)
HEMOGLOBIN: 11.9 g/dL — AB (ref 12.0–15.0)
MCH: 32.8 pg (ref 26.0–34.0)
MCHC: 34.3 g/dL (ref 30.0–36.0)
MCV: 95.6 fL (ref 78.0–100.0)
PLATELETS: 192 10*3/uL (ref 150–400)
RBC: 3.63 MIL/uL — AB (ref 3.87–5.11)
RDW: 13.7 % (ref 11.5–15.5)
WBC: 9.7 10*3/uL (ref 4.0–10.5)

## 2015-01-29 LAB — RPR: RPR Ser Ql: NONREACTIVE

## 2015-01-29 MED ORDER — ONDANSETRON HCL 4 MG/2ML IJ SOLN: 4.0000 mg | Freq: Four times a day (QID) | INTRAMUSCULAR | Status: DC | PRN

## 2015-01-29 MED ORDER — LACTATED RINGERS IV SOLN: INTRAVENOUS | Status: DC

## 2015-01-29 MED ORDER — SIMETHICONE 80 MG PO CHEW: 80.0000 mg | CHEWABLE_TABLET | ORAL | Status: DC | PRN

## 2015-01-29 MED ORDER — FENTANYL 2.5 MCG/ML BUPIVACAINE 1/10 % EPIDURAL INFUSION (WH - ANES): 14.0000 mL/h | INTRAMUSCULAR | Status: DC | PRN

## 2015-01-29 MED ORDER — OXYCODONE-ACETAMINOPHEN 5-325 MG PO TABS: 1.0000 | ORAL_TABLET | ORAL | Status: DC | PRN

## 2015-01-29 MED ORDER — AMPICILLIN SODIUM 2 G IJ SOLR
2.0000 g | Freq: Once | INTRAMUSCULAR | Status: AC
  Administered 2015-01-29: 2 g via INTRAVENOUS
  Filled 2015-01-29: qty 2000

## 2015-01-29 MED ORDER — ONDANSETRON HCL 4 MG PO TABS: 4.0000 mg | ORAL_TABLET | ORAL | Status: DC | PRN

## 2015-01-29 MED ORDER — ACETAMINOPHEN 325 MG PO TABS: 650.0000 mg | ORAL_TABLET | ORAL | Status: DC | PRN

## 2015-01-29 MED ORDER — DIBUCAINE 1 % RE OINT: 1.0000 "application " | TOPICAL_OINTMENT | RECTAL | Status: DC | PRN

## 2015-01-29 MED ORDER — FLEET ENEMA 7-19 GM/118ML RE ENEM: 1.0000 | ENEMA | RECTAL | Status: DC | PRN

## 2015-01-29 MED ORDER — SENNOSIDES-DOCUSATE SODIUM 8.6-50 MG PO TABS
2.0000 | ORAL_TABLET | ORAL | Status: DC
  Administered 2015-01-30 (×2): 2 via ORAL
  Filled 2015-01-29 (×2): qty 2

## 2015-01-29 MED ORDER — CITRIC ACID-SODIUM CITRATE 334-500 MG/5ML PO SOLN: 30.0000 mL | ORAL | Status: DC | PRN

## 2015-01-29 MED ORDER — PHENYLEPHRINE 40 MCG/ML (10ML) SYRINGE FOR IV PUSH (FOR BLOOD PRESSURE SUPPORT)
80.0000 ug | PREFILLED_SYRINGE | INTRAVENOUS | Status: DC | PRN
  Filled 2015-01-29: qty 2

## 2015-01-29 MED ORDER — OXYCODONE-ACETAMINOPHEN 5-325 MG PO TABS
2.0000 | ORAL_TABLET | ORAL | Status: DC | PRN
  Administered 2015-01-29: 2 via ORAL
  Filled 2015-01-29: qty 2

## 2015-01-29 MED ORDER — WITCH HAZEL-GLYCERIN EX PADS: 1.0000 "application " | MEDICATED_PAD | CUTANEOUS | Status: DC | PRN

## 2015-01-29 MED ORDER — BENZOCAINE-MENTHOL 20-0.5 % EX AERO
1.0000 "application " | INHALATION_SPRAY | CUTANEOUS | Status: DC | PRN
  Administered 2015-01-30: 1 via TOPICAL
  Filled 2015-01-29: qty 56

## 2015-01-29 MED ORDER — PRENATAL MULTIVITAMIN CH
1.0000 | ORAL_TABLET | Freq: Every day | ORAL | Status: DC
  Administered 2015-01-29 – 2015-01-30 (×2): 1 via ORAL
  Filled 2015-01-29 (×2): qty 1

## 2015-01-29 MED ORDER — DIPHENHYDRAMINE HCL 25 MG PO CAPS: 25.0000 mg | ORAL_CAPSULE | Freq: Four times a day (QID) | ORAL | Status: DC | PRN

## 2015-01-29 MED ORDER — LACTATED RINGERS IV SOLN: 500.0000 mL | INTRAVENOUS | Status: DC | PRN

## 2015-01-29 MED ORDER — ZOLPIDEM TARTRATE 5 MG PO TABS: 5.0000 mg | ORAL_TABLET | Freq: Every evening | ORAL | Status: DC | PRN

## 2015-01-29 MED ORDER — OXYTOCIN 40 UNITS IN LACTATED RINGERS INFUSION - SIMPLE MED
62.5000 mL/h | INTRAVENOUS | Status: DC
  Filled 2015-01-29: qty 1000

## 2015-01-29 MED ORDER — EPHEDRINE 5 MG/ML INJ
10.0000 mg | INTRAVENOUS | Status: DC | PRN
  Filled 2015-01-29: qty 2

## 2015-01-29 MED ORDER — OXYCODONE-ACETAMINOPHEN 5-325 MG PO TABS
1.0000 | ORAL_TABLET | ORAL | Status: DC | PRN
  Administered 2015-01-29 – 2015-01-30 (×3): 1 via ORAL
  Filled 2015-01-29 (×3): qty 1

## 2015-01-29 MED ORDER — OXYCODONE-ACETAMINOPHEN 5-325 MG PO TABS: 2.0000 | ORAL_TABLET | ORAL | Status: DC | PRN

## 2015-01-29 MED ORDER — BUTORPHANOL TARTRATE 1 MG/ML IJ SOLN
1.0000 mg | INTRAMUSCULAR | Status: DC | PRN
  Administered 2015-01-29: 1 mg via INTRAVENOUS
  Filled 2015-01-29: qty 1

## 2015-01-29 MED ORDER — MEDROXYPROGESTERONE ACETATE 150 MG/ML IM SUSP
150.0000 mg | INTRAMUSCULAR | Status: AC | PRN
  Administered 2015-01-31: 150 mg via INTRAMUSCULAR

## 2015-01-29 MED ORDER — OXYTOCIN BOLUS FROM INFUSION
500.0000 mL | INTRAVENOUS | Status: DC
  Administered 2015-01-29: 500 mL via INTRAVENOUS

## 2015-01-29 MED ORDER — ONDANSETRON HCL 4 MG/2ML IJ SOLN: 4.0000 mg | INTRAMUSCULAR | Status: DC | PRN

## 2015-01-29 MED ORDER — LANOLIN HYDROUS EX OINT: TOPICAL_OINTMENT | CUTANEOUS | Status: DC | PRN

## 2015-01-29 MED ORDER — LIDOCAINE HCL (PF) 1 % IJ SOLN
30.0000 mL | INTRAMUSCULAR | Status: DC | PRN
  Filled 2015-01-29: qty 30

## 2015-01-29 MED ORDER — DIPHENHYDRAMINE HCL 50 MG/ML IJ SOLN: 12.5000 mg | INTRAMUSCULAR | Status: DC | PRN

## 2015-01-29 MED ORDER — IBUPROFEN 600 MG PO TABS
600.0000 mg | ORAL_TABLET | Freq: Four times a day (QID) | ORAL | Status: DC
  Administered 2015-01-29 – 2015-01-31 (×9): 600 mg via ORAL
  Filled 2015-01-29 (×9): qty 1

## 2015-01-29 MED ORDER — TETANUS-DIPHTH-ACELL PERTUSSIS 5-2.5-18.5 LF-MCG/0.5 IM SUSP
0.5000 mL | Freq: Once | INTRAMUSCULAR | Status: AC
  Administered 2015-01-30: 0.5 mL via INTRAMUSCULAR
  Filled 2015-01-29: qty 0.5

## 2015-01-29 NOTE — H&P (Signed)
Marisa Stephens is a 24 y.o. female presenting for UC's. Maternal Medical History:  Reason for admission: Contractions.   Fetal activity: Perceived fetal activity is normal.    Prenatal Complications - Diabetes: none.    OB History    Gravida Para Term Preterm AB TAB SAB Ectopic Multiple Living   Past Medical History  Diagnosis Date  . No pertinent past medical history   . Medical history non-contributory    Past Surgical History  Procedure Laterality Date  . No past surgeries     Family History: family history includes Diabetes in her maternal grandmother. Social History:  reports that she has quit smoking. Her smoking use included Cigarettes. She has a 1.25 pack-year smoking history. She has never used smokeless tobacco. She reports that she does not drink alcohol or use illicit drugs.   Prenatal Transfer Tool  Maternal Diabetes: No Genetic Screening: Normal Maternal Ultrasounds/Referrals: Normal Fetal Ultrasounds or other Referrals:  None Maternal Substance Abuse:  No Significant Maternal Medications:  None Significant Maternal Lab Results:  None Other Comments:  None  Review of Systems  All other systems reviewed and are negative.   Dilation: 7 Exam by:: D Collison RN Blood pressure 141/97, pulse 79, temperature 98 F (36.7 C), temperature source Oral, resp. rate 18, height  (1.549 m), weight 152 lb (68.947 kg), last menstrual period 04/12/2014, not currently breastfeeding. Maternal Exam:  Abdomen: Patient reports no abdominal tenderness. Fetal presentation: vertex  Cervix: Cervix evaluated by digital exam.     Physical Exam  Nursing note and vitals reviewed. Constitutional: She is oriented to person, place, and time. She appears well-developed and well-nourished.  HENT:  Head: Normocephalic and atraumatic.  Eyes: Conjunctivae are normal. Pupils are equal, round, and reactive to light.  Neck: Normal range of motion. Neck  supple.  Cardiovascular: Normal rate and regular rhythm.   Respiratory: Effort normal.  GI: Soft.  Genitourinary: Vagina normal and uterus normal.  Musculoskeletal: Normal range of motion.  Neurological: She is alert and oriented to person, place, and time.  Skin: Skin is warm and dry.  Psychiatric: She has a normal mood and affect. Her behavior is normal. Judgment and thought content normal.    Prenatal labs: ABO, Rh: O/Positive/-- (02/23 0000) Antibody: Negative (02/23 0000) Rubella: Immune (02/23 0000) RPR: Nonreactive (02/23 0000)  HBsAg: Negative (02/23 0000)  HIV: Non-reactive (02/23 0000)  GBS: Positive (06/30 0000)   Assessment/Plan: 39 weeks.  Active labor.  Admit.   Chandra Asher A 01/29/2015, 5:46 AM

## 2015-01-29 NOTE — Progress Notes (Signed)
Transferred to [facility] room 118 report given to [employer] RN

## 2015-01-29 NOTE — Progress Notes (Signed)
UR chart review completed.  

## 2015-01-29 NOTE — MAU Note (Signed)
Pt arrived via EMS with c/o contractions every 2-3 mins. No LOF. GBS+.

## 2015-01-30 LAB — CBC
HCT: 29.6 % — ABNORMAL LOW (ref 36.0–46.0)
Hemoglobin: 10 g/dL — ABNORMAL LOW (ref 12.0–15.0)
MCH: 32.6 pg (ref 26.0–34.0)
MCHC: 33.8 g/dL (ref 30.0–36.0)
MCV: 96.4 fL (ref 78.0–100.0)
Platelets: 194 10*3/uL (ref 150–400)
RBC: 3.07 MIL/uL — ABNORMAL LOW (ref 3.87–5.11)
RDW: 13.9 % (ref 11.5–15.5)
WBC: 11.1 10*3/uL — AB (ref 4.0–10.5)

## 2015-01-30 NOTE — Progress Notes (Signed)
Patient ID: Marisa Stephens, female   DOB: 01/09/1991, 24 y.o.   MRN: [id number] Postpartum day one Blood pressure 133/84 respiration 18 pulse 87 afebrile Fundus firm Lochia moderate Legs negative Doing well

## 2015-01-31 ENCOUNTER — Encounter (HOSPITAL_COMMUNITY): Payer: Self-pay | Admitting: *Deleted

## 2015-01-31 MED ORDER — MEDROXYPROGESTERONE ACETATE 150 MG/ML IM SUSP
150.0000 mg | Freq: Once | INTRAMUSCULAR | Status: DC
  Filled 2015-01-31: qty 1

## 2015-01-31 NOTE — Discharge Instructions (Signed)
Discharge instructions   You can wash your hair  Shower  Eat what you want  Drink what you want  See me in 6 weeks  Your ankles are going to swell more in the next 2 weeks than when pregnant  No sex for 6 weeks   Marisa Stephens A, MD 01/31/2015

## 2015-01-31 NOTE — Progress Notes (Signed)
Patient ID: Marisa Stephens, female   DOB: Jan 29, 1991, 24 y.o.   MRN: [id number] Postpartum  2  Blood pressure 122/89 respiration 18 pulse 98 afebrile Fundus firm Lochia moderate Legs negative doing well home today

## 2015-01-31 NOTE — Discharge Summary (Signed)
Obstetric Discharge Summary Reason for Admission: onset of labor Prenatal Procedures: none Intrapartum Procedures: spontaneous vaginal delivery Postpartum Procedures: none Complications-Operative and Postpartum: none HEMOGLOBIN  Date Value Ref Range Status  01/30/2015 10.0* 12.0 - 15.0 g/dL Final   HCT  Date Value Ref Range Status  01/30/2015 29.6* 36.0 - 46.0 % Final    Physical Exam:  General: alert Lochia: appropriate Uterine Fundus: firm Incision: healing well DVT Evaluation: No evidence of DVT seen on physical exam.  Discharge Diagnoses: Term Pregnancy-delivered  Discharge Information: Date: 01/31/2015 Activity: pelvic rest Diet: routine Medications: Percocet Condition: improved Instructions: refer to practice specific booklet Discharge to: home Follow-up Information    Follow up with MARSHALL,BERNARD A, MD In 6 weeks.   Specialty:  Obstetrics and Gynecology   Contact information:   [address] [place] [address] [phone]       Newborn Data: Live born female  Birth Weight: 6 lb 3.5 oz (2821 g) APGAR: 9, 9  Home with mother.  MARSHALL,BERNARD A 01/31/2015, 6:17 AM

## 2015-06-18 ENCOUNTER — Emergency Department (HOSPITAL_COMMUNITY)
Admission: EM | Admit: 2015-06-18 | Discharge: 2015-06-18 | Disposition: A | Discharge status: Home / Self Care | Payer: Medicaid Other | Attending: Emergency Medicine | Admitting: Emergency Medicine

## 2015-06-18 ENCOUNTER — Encounter (HOSPITAL_COMMUNITY): Payer: Self-pay | Admitting: Emergency Medicine

## 2015-06-18 DIAGNOSIS — N390 Urinary tract infection, site not specified: Secondary | ICD-10-CM

## 2015-06-18 DIAGNOSIS — Z8619 Personal history of other infectious and parasitic diseases: Secondary | ICD-10-CM | POA: Diagnosis not present

## 2015-06-18 DIAGNOSIS — R319 Hematuria, unspecified: Secondary | ICD-10-CM | POA: Insufficient documentation

## 2015-06-18 DIAGNOSIS — Z87891 Personal history of nicotine dependence: Secondary | ICD-10-CM | POA: Insufficient documentation

## 2015-06-18 DIAGNOSIS — Z3202 Encounter for pregnancy test, result negative: Secondary | ICD-10-CM | POA: Insufficient documentation

## 2015-06-18 DIAGNOSIS — R300 Dysuria: Secondary | ICD-10-CM | POA: Diagnosis present

## 2015-06-18 LAB — URINALYSIS, ROUTINE W REFLEX MICROSCOPIC
BILIRUBIN URINE: NEGATIVE
GLUCOSE, UA: NEGATIVE mg/dL
KETONES UR: NEGATIVE mg/dL
Nitrite: NEGATIVE
PH: 6 (ref 5.0–8.0)
Protein, ur: 100 mg/dL — AB
Specific Gravity, Urine: 1.024 (ref 1.005–1.030)

## 2015-06-18 LAB — URINE MICROSCOPIC-ADD ON

## 2015-06-18 LAB — WET PREP, GENITAL
Clue Cells Wet Prep HPF POC: NONE SEEN
SPERM: NONE SEEN
TRICH WET PREP: NONE SEEN
WBC WET PREP: NONE SEEN
YEAST WET PREP: NONE SEEN

## 2015-06-18 LAB — POC URINE PREG, ED: Preg Test, Ur: NEGATIVE

## 2015-06-18 MED ORDER — CEPHALEXIN 500 MG PO CAPS: 500.0000 mg | ORAL_CAPSULE | Freq: Two times a day (BID) | ORAL | Status: DC

## 2015-06-18 NOTE — ED Provider Notes (Signed)
CSN: [id number]     Arrival date & time 06/18/15  1234 History   First MD Initiated Contact with Patient 06/18/15 1445     Chief Complaint  Patient presents with  . Dysuria     (Consider location/radiation/quality/duration/timing/severity/associated sxs/prior Treatment) HPI Comments: Patient presents to the emergency department with chief complaint of dysuria 3 days. She denies any associated fevers, chills, nausea, or vomiting. There are no aggravating or alleviating factors. She reports some associated hematuria. She denies any flank pain or abdominal pain. She states that she has had some vaginal discharge, and is concerned because she had a new sexual partner. There are no other associated symptoms. She has not taken anything for her symptoms.  The history is provided by the patient. No language interpreter was used.    Past Medical History  Diagnosis Date  . No pertinent past medical history   . Medical history non-contributory   . Former smoker   . Marijuana use 12/06/2014    positive  . Hx of chlamydia infection 2010    treated  . Group beta Strep positive 2016   Past Surgical History  Procedure Laterality Date  . No past surgeries     Family History  Problem Relation Age of Onset  . Diabetes Maternal Grandmother    Social History  Substance Use Topics  . Smoking status: Former Smoker -- 0.25 packs/day for 5 years    Types: Cigarettes  . Smokeless tobacco: Never Used  . Alcohol Use: No   OB History    Gravida Para Term Preterm AB TAB SAB Ectopic Multiple Living   0 4     Review of Systems  Constitutional: Negative for fever and chills.  Respiratory: Negative for shortness of breath.   Cardiovascular: Negative for chest pain.  Gastrointestinal: Negative for nausea, vomiting, diarrhea and constipation.  Genitourinary: Positive for dysuria.  All other systems reviewed and are negative.     Allergies  Review of patient's allergies indicates  no known allergies.  Home Medications   Prior to Admission medications   Not on File   BP 112/76 mmHg  Pulse 72  Temp(Src) 98.7 F (37.1 C) (Oral)  Resp 17  SpO2 100% Physical Exam  Constitutional: She is oriented to person, place, and time. She appears well-developed and well-nourished.  HENT:  Head: Normocephalic and atraumatic.  Eyes: Conjunctivae and EOM are normal. Pupils are equal, round, and reactive to light.  Neck: Normal range of motion. Neck supple.  Cardiovascular: Normal rate and regular rhythm.  Exam reveals no gallop and no friction rub.   No murmur heard. Pulmonary/Chest: Effort normal and breath sounds normal. No respiratory distress. She has no wheezes. She has no rales. She exhibits no tenderness.  Abdominal: Soft. Bowel sounds are normal. She exhibits no distension and no mass. There is no tenderness. There is no rebound and no guarding.  Genitourinary:  Pelvic exam chaperoned by female ER tech, no right or left adnexal tenderness, no uterine tenderness, no vaginal discharge, no bleeding, no CMT or friability, no foreign body, no injury to the external genitalia, no other significant findings   Musculoskeletal: Normal range of motion. She exhibits no edema or tenderness.  Neurological: She is alert and oriented to person, place, and time.  Skin: Skin is warm and dry.  Psychiatric: She has a normal mood and affect. Her behavior is normal. Judgment and thought content normal.  Nursing note and vitals reviewed.  ED Course  Procedures (including critical care time) Labs Review Labs Reviewed  URINALYSIS, ROUTINE W REFLEX MICROSCOPIC (NOT AT [facility]) - Abnormal; Notable for the following:    APPearance TURBID (*)    Hgb urine dipstick MODERATE (*)    Protein, ur 100 (*)    Leukocytes, UA MODERATE (*)    All other components within normal limits  URINE MICROSCOPIC-ADD ON - Abnormal; Notable for the following:    Squamous Epithelial / LPF 6-30 (*)    Bacteria,  UA FEW (*)    All other components within normal limits  WET PREP, GENITAL  POC URINE PREG, ED  GC/CHLAMYDIA PROBE AMP ([facility]) NOT AT [facility]     I have personally reviewed and evaluated these images and lab results as part of my medical decision-making.   MDM   Final diagnoses:  UTI (lower urinary tract infection)    Patient with symptoms consistent with UTI. Patient also concerned for STD. Wet prep is unremarkable. Will treat UTI. GC testing is pending.    Roxy Horsemanobert Tanveer Brammer, PA-C 06/18/15 1557  Vanetta MuldersScott Zackowski, MD 06/19/15 [phone]

## 2015-06-18 NOTE — ED Notes (Signed)
Pt sts pain and burning with urination x 3 days; pt on depo

## 2015-06-18 NOTE — Discharge Instructions (Signed)

## 2015-06-19 LAB — GC/CHLAMYDIA PROBE AMP (~~LOC~~) NOT AT ARMC
Chlamydia: NEGATIVE
NEISSERIA GONORRHEA: NEGATIVE

## 2016-06-29 NOTE — L&D Delivery Note (Signed)
Patient is a 26 y.o. now G6P4s/p NSVD at 5076w0d, who was admitted for IOL for postdates pregnancy  Delivery Note At 3:04 PM a viable female was delivered via Vaginal, Spontaneous (Presentation: cephalic ; LOA ).  APGAR: , ; weight pending  Placenta status:intact , .  Cord: 3 vessels  with the following complications: none.    Anesthesia: epidural Episiotomy: None Lacerations: None Suture Repair: none Est. Blood Loss (mL): 150  Mom to postpartum.  Baby to Couplet care / Skin to Skin.  Head delivered lOA. No nuchal cord present. Shoulder and body delivered with suprapubic pressure applied.  Infant with spontaneous cry, placed on mother's abdomen, dried and bulb suctioned. Cord clamped x 2 after 1-minute delay, and cut by family member. Cord blood drawn. Placenta delivered spontaneously with gentle cord traction. Fundus firm with massage and Pitocin. Perineum inspected and found to have no lacerations.  Teodoro KilKerrriann S. Demika Langenderfer,  MD Family Medicine Resident PGY-1 05/10/17, 3:20 PM

## 2016-09-29 ENCOUNTER — Ambulatory Visit (INDEPENDENT_AMBULATORY_CARE_PROVIDER_SITE_OTHER): Payer: Medicaid Other

## 2016-09-29 DIAGNOSIS — Z3200 Encounter for pregnancy test, result unknown: Secondary | ICD-10-CM

## 2016-09-29 DIAGNOSIS — Z3201 Encounter for pregnancy test, result positive: Secondary | ICD-10-CM

## 2016-09-29 LAB — POCT URINE PREGNANCY: PREG TEST UR: POSITIVE — AB

## 2016-09-29 MED ORDER — PRENATAL PLUS 27-1 MG PO TABS: 1.0000 | ORAL_TABLET | Freq: Every day | ORAL | 3 refills | Status: DC

## 2016-09-29 NOTE — Progress Notes (Signed)
Patient is in the office for UPT, results positive. LMP 1-71-18, EDD 04-11-17, pt is an estimated [redacted]w[redacted]d today.

## 2016-10-16 DIAGNOSIS — O0990 Supervision of high risk pregnancy, unspecified, unspecified trimester: Secondary | ICD-10-CM

## 2016-10-16 HISTORY — DX: Supervision of high risk pregnancy, unspecified, unspecified trimester: O09.90

## 2016-10-19 ENCOUNTER — Ambulatory Visit (INDEPENDENT_AMBULATORY_CARE_PROVIDER_SITE_OTHER): Payer: Medicaid Other | Admitting: Certified Nurse Midwife

## 2016-10-19 ENCOUNTER — Encounter: Payer: Self-pay | Admitting: Certified Nurse Midwife

## 2016-10-19 ENCOUNTER — Other Ambulatory Visit (HOSPITAL_COMMUNITY)
Admission: RE | Admit: 2016-10-19 | Discharge: 2016-10-19 | Disposition: A | Discharge status: Home / Self Care | Payer: Medicaid Other | Source: Ambulatory Visit | Attending: Certified Nurse Midwife | Admitting: Certified Nurse Midwife

## 2016-10-19 VITALS — BP 117/70 | HR 118 | Temp 97.8°F | Wt 122.8 lb

## 2016-10-19 DIAGNOSIS — O0992 Supervision of high risk pregnancy, unspecified, second trimester: Secondary | ICD-10-CM

## 2016-10-19 DIAGNOSIS — Z3490 Encounter for supervision of normal pregnancy, unspecified, unspecified trimester: Secondary | ICD-10-CM

## 2016-10-19 DIAGNOSIS — O0990 Supervision of high risk pregnancy, unspecified, unspecified trimester: Secondary | ICD-10-CM

## 2016-10-19 DIAGNOSIS — O09212 Supervision of pregnancy with history of pre-term labor, second trimester: Secondary | ICD-10-CM

## 2016-10-19 DIAGNOSIS — O09899 Supervision of other high risk pregnancies, unspecified trimester: Secondary | ICD-10-CM | POA: Insufficient documentation

## 2016-10-19 DIAGNOSIS — O09219 Supervision of pregnancy with history of pre-term labor, unspecified trimester: Secondary | ICD-10-CM

## 2016-10-19 NOTE — Progress Notes (Signed)
Subjective:    Marisa Stephens is being seen today for her first obstetrical visit.  This is not a planned pregnancy. She is at [redacted]w[redacted]d gestation. Her obstetrical history is significant for preterm birth at 35 weeks with preterm labor. Relationship with FOB: significant other, living together. Patient does intend to breast feed. Pregnancy history fully reviewed.  The information documented in the HPI was reviewed and verified.  Menstrual History: OB History    Gravida Para Term Preterm AB Living   6 4 3 1 1 4   SAB TAB Ectopic Multiple Live Births   1     0 4       Patient's last menstrual period was 07/05/2016 (approximate).    Past Medical History:  Diagnosis Date  . Former smoker   . Group beta Strep positive 2016  . Hx of chlamydia infection 2010   treated  . 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 Medical history non-contributory   . No pertinent past medical history     Past Surgical History:  Procedure Laterality Date  . NO PAST SURGERIES       (Not in a hospital admission) No Known Allergies  Social History  Substance Use Topics  . Smoking status: Current Some Day Smoker    Packs/day: 0.25    Years: 5.00    Types: Cigarettes  . Smokeless tobacco: Never Used  . Alcohol use No    Family History  Problem Relation Age of Onset  . Diabetes Maternal Grandmother      Review of Systems Constitutional: negative for weight loss Gastrointestinal: negative for vomiting, + nausea Genitourinary:negative for genital lesions and vaginal discharge and dysuria Musculoskeletal:negative for back pain Behavioral/Psych: negative for abusive relationship, depression, illegal drug usage and tobacco use    Objective:    BP 117/70   Pulse (!) 118   Temp 97.8 F (36.6 C)   Wt 122 lb 12.8 oz (55.7 kg)   LMP 07/05/2016 (Approximate)   BMI 23.20 kg/m  General Appearance:    Alert, cooperative, no distress, appears stated age  Head:    Normocephalic, without obvious  abnormality, atraumatic  Eyes:    PERRL, conjunctiva/corneas clear, EOM's intact, fundi    benign, both eyes  Ears:    Normal TM's and external ear canals, both ears  Nose:   Nares normal, septum midline, mucosa normal, no drainage    or sinus tenderness  Throat:   Lips, mucosa, and tongue normal; teeth and gums normal  Neck:   Supple, symmetrical, trachea midline, no adenopathy;    thyroid:  no enlargement/tenderness/nodules; no carotid   bruit or JVD  Back:     Symmetric, no curvature, ROM normal, no CVA tenderness  Lungs:     Clear to auscultation bilaterally, respirations unlabored  Chest Wall:    No tenderness or deformity   Heart:    Regular rate and rhythm, S1 and S2 normal, no murmur, rub   or gallop  Breast Exam:    No tenderness, masses, or nipple abnormality  Abdomen:     Soft, non-tender, bowel sounds active all four quadrants,    no masses, no organomegaly  Genitalia:    Normal female without lesion, discharge or tenderness  Extremities:   Extremities normal, atraumatic, no cyanosis or edema  Pulses:   2+ and symmetric all extremities  Skin:   Skin color, texture, turgor normal, no rashes or lesions  Lymph nodes:   Cervical, supraclavicular, and axillary nodes normal    Neurologic:   CNII-XII intact, normal strength, sensation and reflexes    throughout        Cervix: long, thick, closed and posterior.  FHR: present by doppler.  FH: about 17    cm.     Lab Review Urine pregnancy test Labs reviewed yes Radiologic studies reviewed no Assessment:    Pregnancy at 44w1dweeks   Encounter for supervision of normal pregnancy, antepartum, unspecified gravidity  History of premature delivery, currently pregnant  Supervision of high risk pregnancy, antepartum - Plan: Obstetric Panel, Including HIV, Hemoglobinopathy evaluation, Cystic Fibrosis Mutation 97, Culture, OB Urine, Cervicovaginal ancillary only, Cytology - PAP, Varicella zoster antibody, IgG, HgB A1c, Comp Met (CMET),  MaterniT Genome, [place]FM OB COMP + 14 WK   Plan:     17-P ordered.   Prenatal vitamins.  Counseling provided regarding continued use of seat belts, cessation of alcohol consumption, smoking or use of illicit drugs; infection precautions i.e., influenza/TDAP immunizations, toxoplasmosis,CMV, parvovirus, listeria and varicella; workplace safety, exercise during pregnancy; routine dental care, safe medications, sexual activity, hot tubs, saunas, pools, travel, caffeine use, fish and methlymercury, potential toxins, hair treatments, varicose veins Weight gain recommendations per IOM guidelines reviewed: underweight/BMI< 18.5--> gain 28 - 40 lbs; normal weight/BMI 18.5 - 24.9--> gain 25 - 35 lbs; overweight/BMI 25 - 29.9--> gain 15 - 25 lbs; obese/BMI >30->gain  11 - 20 lbs Problem list reviewed and updated. FIRST/CF mutation testing/NIPT/QUAD SCREEN/fragile X/Ashkenazi Jewish population testing/Spinal muscular atrophy discussed: ordered. Role of ultrasound in pregnancy discussed; fetal survey: requested. Amniocentesis discussed: not indicated.  No orders of the defined types were placed in this encounter.  Orders Placed This Encounter  Procedures  . Culture, OB Urine  . UK[place]MFM OB COMP + 14 WK    Standing Status:   Future    Standing Expiration Date:   12/19/2017    Order Specific Question:   Reason for Exam (SYMPTOM  OR DIAGNOSIS REQUIRED)    Answer:   dating, grand multigravida, hx of ptb    Order Specific Question:   Preferred imaging location?    Answer:   [facility]  . Obstetric Panel, Including HIV  . Hemoglobinopathy evaluation  . Cystic Fibrosis Mutation 97  . Varicella zoster antibody, IgG  . HgB A1c  . Comp Met (CMET)  . MaterniT Genome    Order Specific Question:   Is the patient insulin dependent?    Answer:   No    Order Specific Question:   Please enter gestational age. This should be expressed as weeks AND days, i.e. 16w 6d. Enter weeks here. Enter days in next  question.    Answer:   157   Order Specific Question:   Please enter gestational age. This should be expressed as weeks AND days, i.e. 16w 6d. Enter days here. Enter weeks in previous question.    Answer:   1    Order Specific Question:   How was gestational age calculated?    Answer:   LMP    Order Specific Question:   Please give the date of LMP OR Ultrasound OR Estimated date of delivery.    Answer:   04/11/2017    Order Specific Question:   Number of Fetuses (Type of Pregnancy):    Answer:   1    Order Specific Question:   Indications for performing the test? (please choose all that apply):    Answer:   Routine screening    Order Specific Question:  Other Indications? (Y=Yes, N=No)    Answer:   Y    Order Specific Question:   Please specify other indications, if any:    Answer:   hx of preterm delivery, grand multigravida    Order Specific Question:   If this is a repeat specimen, please indicate the reason:    Answer:   Not indicated    Order Specific Question:   Please specify the patient's race: (C=White/Caucasion, B=Black, I=Native American, A=Asian, H=Hispanic, O=Other, U=Unknown)    Answer:   B    Order Specific Question:   Donor Egg - indicate if the egg was obtained from in vitro fertilization.    Answer:   N    Order Specific Question:   Age of Egg Donor.    Answer:   31    Order Specific Question:   Prior Down Syndrome/ONTD screening during current pregnancy.    Answer:   N    Order Specific Question:   Prior First Trimester Testing    Answer:   N    Order Specific Question:   Prior Second Trimester Testing    Answer:   N    Order Specific Question:   Family History of Neural Tube Defects    Answer:   N    Order Specific Question:   Prior Pregnancy with Down Syndrome    Answer:   N    Order Specific Question:   Please give the patient's weight (in pounds)    Answer:   122    Follow up in 2 weeks. 50% of 45 min visit spent on counseling and coordination of care.

## 2016-10-21 ENCOUNTER — Encounter: Payer: Self-pay | Admitting: *Deleted

## 2016-10-21 ENCOUNTER — Other Ambulatory Visit: Payer: Self-pay | Admitting: Certified Nurse Midwife

## 2016-10-21 DIAGNOSIS — O0990 Supervision of high risk pregnancy, unspecified, unspecified trimester: Secondary | ICD-10-CM

## 2016-10-21 DIAGNOSIS — N760 Acute vaginitis: Principal | ICD-10-CM

## 2016-10-21 DIAGNOSIS — B9689 Other specified bacterial agents as the cause of diseases classified elsewhere: Secondary | ICD-10-CM

## 2016-10-21 LAB — CERVICOVAGINAL ANCILLARY ONLY
BACTERIAL VAGINITIS: POSITIVE — AB
CANDIDA VAGINITIS: NEGATIVE
Chlamydia: NEGATIVE
NEISSERIA GONORRHEA: NEGATIVE
Trichomonas: NEGATIVE

## 2016-10-21 LAB — CULTURE, OB URINE

## 2016-10-21 LAB — CYTOLOGY - PAP
ADEQUACY: ABSENT
DIAGNOSIS: NEGATIVE

## 2016-10-21 LAB — URINE CULTURE, OB REFLEX

## 2016-10-21 MED ORDER — METRONIDAZOLE 500 MG PO TABS: 500.0000 mg | ORAL_TABLET | Freq: Two times a day (BID) | ORAL | 0 refills | Status: DC

## 2016-10-23 LAB — VARICELLA ZOSTER ANTIBODY, IGG: Varicella zoster IgG: 1072 index (ref >165)

## 2016-10-23 LAB — COMPREHENSIVE METABOLIC PANEL
A/G RATIO: 1.5 (ref 1.2–2.2)
ALT: 10 IU/L (ref 0–32)
AST: 12 IU/L (ref 0–40)
Albumin: 4.2 g/dL (ref 3.5–5.5)
Alkaline Phosphatase: 76 IU/L (ref 39–117)
BUN/Creatinine Ratio: 11 (ref 9–23)
BUN: 7 mg/dL (ref 6–20)
Bilirubin Total: 0.9 mg/dL (ref 0.0–1.2)
CALCIUM: 9.8 mg/dL (ref 8.7–10.2)
CO2: 23 mmol/L (ref 18–29)
CREATININE: 0.62 mg/dL (ref 0.57–1.00)
Chloride: 98 mmol/L (ref 96–106)
GFR, EST AFRICAN AMERICAN: 144 mL/min/{1.73_m2} (ref >59)
GFR, EST NON AFRICAN AMERICAN: 125 mL/min/{1.73_m2} (ref >59)
GLOBULIN, TOTAL: 2.8 g/dL (ref 1.5–4.5)
Glucose: 91 mg/dL (ref 65–99)
POTASSIUM: 3.5 mmol/L (ref 3.5–5.2)
Sodium: 137 mmol/L (ref 134–144)
TOTAL PROTEIN: 7 g/dL (ref 6.0–8.5)

## 2016-10-23 LAB — OBSTETRIC PANEL, INCLUDING HIV
ANTIBODY SCREEN: NEGATIVE
BASOS: 0 %
Basophils Absolute: 0 10*3/uL (ref 0.0–0.2)
EOS (ABSOLUTE): 0.1 10*3/uL (ref 0.0–0.4)
EOS: 2 %
HEMATOCRIT: 38.1 % (ref 34.0–46.6)
HEMOGLOBIN: 12.6 g/dL (ref 11.1–15.9)
HIV Screen 4th Generation wRfx: NONREACTIVE
Hepatitis B Surface Ag: NEGATIVE
Immature Grans (Abs): 0 10*3/uL (ref 0.0–0.1)
Immature Granulocytes: 0 %
LYMPHS ABS: 3.1 10*3/uL (ref 0.7–3.1)
Lymphs: 41 %
MCH: 31.4 pg (ref 26.6–33.0)
MCHC: 33.1 g/dL (ref 31.5–35.7)
MCV: 95 fL (ref 79–97)
MONOS ABS: 0.5 10*3/uL (ref 0.1–0.9)
Monocytes: 7 %
NEUTROS PCT: 50 %
Neutrophils Absolute: 3.8 10*3/uL (ref 1.4–7.0)
Platelets: 360 10*3/uL (ref 150–379)
RBC: 4.01 x10E6/uL (ref 3.77–5.28)
RDW: 14 % (ref 12.3–15.4)
RH TYPE: POSITIVE
RPR Ser Ql: NONREACTIVE
Rubella Antibodies, IGG: 4.15 index (ref >0.99)
WBC: 7.6 10*3/uL (ref 3.4–10.8)

## 2016-10-23 LAB — CYSTIC FIBROSIS MUTATION 97: GENE DIS ANAL CARRIER INTERP BLD/T-IMP: NOT DETECTED

## 2016-10-23 LAB — HEMOGLOBINOPATHY EVALUATION
HEMOGLOBIN A2 QUANTITATION: 3 % (ref 1.8–3.2)
HGB C: 0 %
HGB S: 0 %
HGB VARIANT: 0 %
Hemoglobin F Quantitation: 0 % (ref 0.0–2.0)
Hgb A: 97 % (ref 96.4–98.8)

## 2016-10-23 LAB — HEMOGLOBIN A1C
Est. average glucose Bld gHb Est-mCnc: 100 mg/dL
Hgb A1c MFr Bld: 5.1 % (ref 4.8–5.6)

## 2016-10-26 ENCOUNTER — Other Ambulatory Visit: Payer: Self-pay | Admitting: Certified Nurse Midwife

## 2016-10-26 DIAGNOSIS — O0990 Supervision of high risk pregnancy, unspecified, unspecified trimester: Secondary | ICD-10-CM

## 2016-10-26 LAB — MATERNIT GENOME

## 2016-10-28 ENCOUNTER — Ambulatory Visit (INDEPENDENT_AMBULATORY_CARE_PROVIDER_SITE_OTHER): Payer: Medicaid Other

## 2016-10-28 ENCOUNTER — Other Ambulatory Visit: Payer: Self-pay | Admitting: Certified Nurse Midwife

## 2016-10-28 ENCOUNTER — Ambulatory Visit (HOSPITAL_COMMUNITY)
Admission: RE | Admit: 2016-10-28 | Discharge: 2016-10-28 | Disposition: A | Discharge status: Home / Self Care | Payer: Medicaid Other | Source: Ambulatory Visit | Attending: Certified Nurse Midwife | Admitting: Certified Nurse Midwife

## 2016-10-28 VITALS — BP 111/64 | HR 98 | Wt 125.7 lb

## 2016-10-28 DIAGNOSIS — O09219 Supervision of pregnancy with history of pre-term labor, unspecified trimester: Secondary | ICD-10-CM

## 2016-10-28 DIAGNOSIS — Z3491 Encounter for supervision of normal pregnancy, unspecified, first trimester: Secondary | ICD-10-CM

## 2016-10-28 DIAGNOSIS — Z3A13 13 weeks gestation of pregnancy: Secondary | ICD-10-CM

## 2016-10-28 DIAGNOSIS — Z3687 Encounter for antenatal screening for uncertain dates: Secondary | ICD-10-CM | POA: Insufficient documentation

## 2016-10-28 DIAGNOSIS — Z3682 Encounter for antenatal screening for nuchal translucency: Secondary | ICD-10-CM

## 2016-10-28 DIAGNOSIS — O09211 Supervision of pregnancy with history of pre-term labor, first trimester: Secondary | ICD-10-CM | POA: Diagnosis present

## 2016-10-28 DIAGNOSIS — O09899 Supervision of other high risk pregnancies, unspecified trimester: Secondary | ICD-10-CM

## 2016-10-28 DIAGNOSIS — O09212 Supervision of pregnancy with history of pre-term labor, second trimester: Secondary | ICD-10-CM

## 2016-10-28 DIAGNOSIS — O0990 Supervision of high risk pregnancy, unspecified, unspecified trimester: Secondary | ICD-10-CM

## 2016-10-28 MED ORDER — HYDROXYPROGESTERONE CAPROATE 250 MG/ML IM OIL
250.0000 mg | TOPICAL_OIL | Freq: Once | INTRAMUSCULAR | Status: AC
  Administered 2016-10-28: 250 mg via INTRAMUSCULAR

## 2016-10-28 NOTE — Addendum Note (Signed)
Addended by: Maretta Bees on: 10/28/2016 11:50 AM   Modules accepted: Level of Service

## 2016-10-28 NOTE — Progress Notes (Signed)
Patient presents for 17P injection. Given in RUOQ. Tolerated well.  Administrations This Visit    hydroxyprogesterone caproate (MAKENA) 250 mg/mL injection 250 mg    Admin Date 10/28/2016 Action Given Dose 250 mg Route Intramuscular Administered By Maretta Bees, RMA

## 2016-11-02 ENCOUNTER — Other Ambulatory Visit: Payer: Self-pay

## 2016-11-02 DIAGNOSIS — O0990 Supervision of high risk pregnancy, unspecified, unspecified trimester: Secondary | ICD-10-CM

## 2016-11-04 ENCOUNTER — Ambulatory Visit (INDEPENDENT_AMBULATORY_CARE_PROVIDER_SITE_OTHER): Payer: Medicaid Other | Admitting: Obstetrics & Gynecology

## 2016-11-04 DIAGNOSIS — O09212 Supervision of pregnancy with history of pre-term labor, second trimester: Secondary | ICD-10-CM

## 2016-11-04 DIAGNOSIS — O0992 Supervision of high risk pregnancy, unspecified, second trimester: Secondary | ICD-10-CM | POA: Diagnosis not present

## 2016-11-04 DIAGNOSIS — O0990 Supervision of high risk pregnancy, unspecified, unspecified trimester: Secondary | ICD-10-CM

## 2016-11-04 DIAGNOSIS — O09899 Supervision of other high risk pregnancies, unspecified trimester: Secondary | ICD-10-CM

## 2016-11-04 DIAGNOSIS — O09219 Supervision of pregnancy with history of pre-term labor, unspecified trimester: Secondary | ICD-10-CM

## 2016-11-04 NOTE — Patient Instructions (Signed)

## 2016-11-04 NOTE — Progress Notes (Signed)
Patient was informed of recalculated due date and GA. Start 17 P injections in 2 weeks and provider visit, AFP at that time   PRENATAL VISIT NOTE  Subjective:  [name] is a 26 y.o. [id number] at 78102w2d being seen today for ongoing prenatal care.  She is currently monitored for the following issues for this high-risk pregnancy and has Supervision of high risk pregnancy, antepartum and History of premature delivery, currently pregnant on her problem list.  Patient reports no complaints.   .  .   . Denies leaking of fluid.   The following portions of the patient's history were reviewed and updated as appropriate: allergies, current medications, past family history, past medical history, past social history, past surgical history and problem list. Problem list updated.  Objective:  There were no vitals filed for this visit.  Fetal Status:           General:  Alert, oriented and cooperative. Patient is in no acute distress.  Skin: Skin is warm and dry. No rash noted.   Cardiovascular: Normal heart rate noted  Respiratory: Normal respiratory effort, no problems with respiration noted  Abdomen: Soft, gravid, appropriate for gestational age.       Pelvic:  Cervical exam deferred        Extremities: Normal range of motion.     Mental Status: Normal mood and affect. Normal behavior. Normal judgment and thought content.   Assessment and Plan:  Pregnancy: [id number] at 42102w2d  1. Supervision of high risk pregnancy, antepartum screening anatomy - US MFM OB COMP + 14 WK; Future  2. History of premature delivery, currently pregnant  - US MFM OB COMP + 14 WK; Future   general obstetric precautions including but not limited to vaginal bleeding, contractions, leaking of fluid and fetal movement were reviewed in detail with the patient. Please refer to After Visit Summary for other counseling recommendations.  Return in about 2 weeks (around 11/18/2016) for 17 p.   [name], [name], MD

## 2016-11-09 ENCOUNTER — Encounter: Payer: Self-pay | Admitting: *Deleted

## 2016-11-19 ENCOUNTER — Encounter: Payer: Medicaid Other | Admitting: Obstetrics & Gynecology

## 2016-11-20 ENCOUNTER — Encounter: Payer: Medicaid Other | Admitting: Obstetrics

## 2016-11-25 ENCOUNTER — Encounter: Payer: Medicaid Other | Admitting: Obstetrics

## 2016-11-25 ENCOUNTER — Telehealth: Payer: Self-pay

## 2016-11-25 NOTE — Telephone Encounter (Signed)
Called patient about missed appt today. Patient grandmother answered, and states that she has not heard from pt since yesterday. I asked grandmother to have pt give office a call when she here's from her.

## 2016-12-07 ENCOUNTER — Other Ambulatory Visit: Payer: Self-pay | Admitting: Obstetrics & Gynecology

## 2016-12-07 ENCOUNTER — Ambulatory Visit (HOSPITAL_COMMUNITY)
Admission: RE | Admit: 2016-12-07 | Discharge: 2016-12-07 | Disposition: A | Discharge status: Home / Self Care | Payer: Medicaid Other | Source: Ambulatory Visit | Attending: Obstetrics & Gynecology | Admitting: Obstetrics & Gynecology

## 2016-12-07 DIAGNOSIS — O0990 Supervision of high risk pregnancy, unspecified, unspecified trimester: Secondary | ICD-10-CM

## 2016-12-07 DIAGNOSIS — O09899 Supervision of other high risk pregnancies, unspecified trimester: Secondary | ICD-10-CM

## 2016-12-07 DIAGNOSIS — Z363 Encounter for antenatal screening for malformations: Secondary | ICD-10-CM | POA: Insufficient documentation

## 2016-12-07 DIAGNOSIS — O09219 Supervision of pregnancy with history of pre-term labor, unspecified trimester: Secondary | ICD-10-CM

## 2016-12-07 DIAGNOSIS — O09212 Supervision of pregnancy with history of pre-term labor, second trimester: Secondary | ICD-10-CM | POA: Diagnosis not present

## 2016-12-07 DIAGNOSIS — Z3A19 19 weeks gestation of pregnancy: Secondary | ICD-10-CM

## 2016-12-07 DIAGNOSIS — Z369 Encounter for antenatal screening, unspecified: Secondary | ICD-10-CM

## 2016-12-09 ENCOUNTER — Ambulatory Visit (INDEPENDENT_AMBULATORY_CARE_PROVIDER_SITE_OTHER): Payer: Medicaid Other | Admitting: Obstetrics & Gynecology

## 2016-12-09 VITALS — BP 124/74 | HR 129 | Wt 137.0 lb

## 2016-12-09 DIAGNOSIS — O0992 Supervision of high risk pregnancy, unspecified, second trimester: Secondary | ICD-10-CM | POA: Diagnosis not present

## 2016-12-09 DIAGNOSIS — O09212 Supervision of pregnancy with history of pre-term labor, second trimester: Secondary | ICD-10-CM

## 2016-12-09 DIAGNOSIS — O0990 Supervision of high risk pregnancy, unspecified, unspecified trimester: Secondary | ICD-10-CM

## 2016-12-09 MED ORDER — HYDROXYPROGESTERONE CAPROATE 275 MG/1.1ML ~~LOC~~ SOAJ: 275.0000 mg | Freq: Once | SUBCUTANEOUS | Status: DC

## 2016-12-09 NOTE — Progress Notes (Signed)
Makena given today R buttock w/o difficulty. Pt supplied.

## 2016-12-09 NOTE — Progress Notes (Signed)
   PRENATAL VISIT NOTE  Subjective:  Marisa Stephens is a 26 y.o. [id number] at 4622w2d being seen today for ongoing prenatal care.  She is currently monitored for the following issues for this high-risk pregnancy and has Supervision of high risk pregnancy, antepartum and History of premature delivery, currently pregnant on her problem list.  Patient reports cramps.  Contractions: Not present. Vag. Bleeding: None.   . Denies leaking of fluid.   The following portions of the patient's history were reviewed and updated as appropriate: allergies, current medications, past family history, past medical history, past social history, past surgical history and problem list. Problem list updated.  Objective:   Vitals:   12/09/16 1629  BP: 124/74  Pulse: (!) 129  Weight: 137 lb (62.1 kg)    Fetal Status: Fetal Heart Rate (bpm): 160         General:  Alert, oriented and cooperative. Patient is in no acute distress.  Skin: Skin is warm and dry. No rash noted.   Cardiovascular: Normal heart rate noted  Respiratory: Normal respiratory effort, no problems with respiration noted  Abdomen: Soft, gravid, appropriate for gestational age. Pain/Pressure: Absent     Pelvic:  Cervical exam deferred        Extremities: Normal range of motion.  Edema: Trace  Mental Status: Normal mood and affect. Normal behavior. Normal judgment and thought content.   Assessment and Plan:  Pregnancy: [id number] at 422w2d  1. Supervision of high risk pregnancy, antepartum Needs f/u anatomy US  Preterm labor symptoms and general obstetric precautions including but not limited to vaginal bleeding, contractions, leaking of fluid and fetal movement were reviewed in detail with the patient. Please refer to After Visit Summary for other counseling recommendations.  Return in about 4 weeks (around 01/06/2017) for 17 p every week.   Scheryl DarterJames Haruto Demaria, MD

## 2016-12-09 NOTE — Patient Instructions (Signed)
Second Trimester of Pregnancy The second trimester is from week 13 through week 28, month 4 through 6. This is often the time in pregnancy that you feel your best. Often times, morning sickness has lessened or quit. You may have more energy, and you may get hungry more often. Your unborn baby (fetus) is growing rapidly. At the end of the sixth month, he or she is about 9 inches long and weighs about 1 pounds. You will likely feel the baby move (quickening) between 18 and 20 weeks of pregnancy. Follow these instructions at home:  Avoid all smoking, herbs, and alcohol. Avoid drugs not approved by your doctor.  Do not use any tobacco products, including cigarettes, chewing tobacco, and electronic cigarettes. If you need help quitting, ask your doctor. You may get counseling or other support to help you quit.  Only take medicine as told by your doctor. Some medicines are safe and some are not during pregnancy.  Exercise only as told by your doctor. Stop exercising if you start having cramps.  Eat regular, healthy meals.  Wear a good support bra if your breasts are tender.  Do not use hot tubs, steam rooms, or saunas.  Wear your seat belt when driving.  Avoid raw meat, uncooked cheese, and liter boxes and soil used by cats.  Take your prenatal vitamins.  Take 1500-2000 milligrams of calcium daily starting at the 20th week of pregnancy until you deliver your baby.  Try taking medicine that helps you poop (stool softener) as needed, and if your doctor approves. Eat more fiber by eating fresh fruit, vegetables, and whole grains. Drink enough fluids to keep your pee (urine) clear or pale yellow.  Take warm water baths (sitz baths) to soothe pain or discomfort caused by hemorrhoids. Use hemorrhoid cream if your doctor approves.  If you have puffy, bulging veins (varicose veins), wear support hose. Raise (elevate) your feet for 15 minutes, 3-4 times a day. Limit salt in your diet.  Avoid heavy  lifting, wear low heals, and sit up straight.  Rest with your legs raised if you have leg cramps or low back pain.  Visit your dentist if you have not gone during your pregnancy. Use a soft toothbrush to brush your teeth. Be gentle when you floss.  You can have sex (intercourse) unless your doctor tells you not to.  Go to your doctor visits. Get help if:  You feel dizzy.  You have mild cramps or pressure in your lower belly (abdomen).  You have a nagging pain in your belly area.  You continue to feel sick to your stomach (nauseous), throw up (vomit), or have watery poop (diarrhea).  You have bad smelling fluid coming from your vagina.  You have pain with peeing (urination). Get help right away if:  You have a fever.  You are leaking fluid from your vagina.  You have spotting or bleeding from your vagina.  You have severe belly cramping or pain.  You lose or gain weight rapidly.  You have trouble catching your breath and have chest pain.  You notice sudden or extreme puffiness (swelling) of your face, hands, ankles, feet, or legs.  You have not felt the baby move in over an hour.  You have severe headaches that do not go away with medicine.  You have vision changes. This information is not intended to replace advice given to you by your health care provider. Make sure you discuss any questions you have with your health care   provider. Document Released: 09/09/2009 Document Revised: 11/21/2015 Document Reviewed: 08/16/2012 Elsevier Interactive Patient Education  2017 [employer]  

## 2016-12-10 DIAGNOSIS — O09212 Supervision of pregnancy with history of pre-term labor, second trimester: Secondary | ICD-10-CM | POA: Diagnosis not present

## 2016-12-10 MED ORDER — HYDROXYPROGESTERONE CAPROATE 250 MG/ML IM OIL
250.0000 mg | TOPICAL_OIL | Freq: Once | INTRAMUSCULAR | Status: AC
  Administered 2016-12-10: 250 mg via INTRAMUSCULAR

## 2016-12-16 ENCOUNTER — Ambulatory Visit: Payer: Medicaid Other

## 2016-12-23 ENCOUNTER — Ambulatory Visit (INDEPENDENT_AMBULATORY_CARE_PROVIDER_SITE_OTHER): Payer: Medicaid Other

## 2016-12-23 VITALS — BP 106/61 | HR 96 | Wt 140.0 lb

## 2016-12-23 DIAGNOSIS — O09899 Supervision of other high risk pregnancies, unspecified trimester: Secondary | ICD-10-CM

## 2016-12-23 DIAGNOSIS — O09212 Supervision of pregnancy with history of pre-term labor, second trimester: Secondary | ICD-10-CM

## 2016-12-23 DIAGNOSIS — O09219 Supervision of pregnancy with history of pre-term labor, unspecified trimester: Principal | ICD-10-CM

## 2016-12-23 MED ORDER — HYDROXYPROGESTERONE CAPROATE 250 MG/ML IM OIL
250.0000 mg | TOPICAL_OIL | Freq: Once | INTRAMUSCULAR | Status: AC
  Administered 2016-12-23: 250 mg via INTRAMUSCULAR

## 2016-12-23 NOTE — Progress Notes (Signed)
Patient presents for 17P Injection. Given in RUOQ. Tolerated well. Administrations This Visit    hydroxyprogesterone caproate (MAKENA) 250 mg/mL injection 250 mg    Admin Date 12/23/2016 Action Given Dose 250 mg Route Intramuscular Administered By Maretta BeesMcGlashan, Tranika Scholler J, RMA

## 2016-12-29 ENCOUNTER — Ambulatory Visit: Payer: Medicaid Other

## 2016-12-31 ENCOUNTER — Ambulatory Visit (INDEPENDENT_AMBULATORY_CARE_PROVIDER_SITE_OTHER): Payer: Medicaid Other

## 2016-12-31 DIAGNOSIS — O09899 Supervision of other high risk pregnancies, unspecified trimester: Secondary | ICD-10-CM

## 2016-12-31 DIAGNOSIS — O09219 Supervision of pregnancy with history of pre-term labor, unspecified trimester: Principal | ICD-10-CM

## 2016-12-31 DIAGNOSIS — O09212 Supervision of pregnancy with history of pre-term labor, second trimester: Secondary | ICD-10-CM | POA: Diagnosis not present

## 2016-12-31 MED ORDER — HYDROXYPROGESTERONE CAPROATE 250 MG/ML IM OIL
250.0000 mg | TOPICAL_OIL | Freq: Once | INTRAMUSCULAR | Status: AC
  Administered 2016-12-31: 250 mg via INTRAMUSCULAR

## 2016-12-31 NOTE — Progress Notes (Signed)
Patient presents for 17P injection.Given in LUOQ. Tolerated well.  Administrations This Visit    hydroxyprogesterone caproate (MAKENA) 250 mg/mL injection 250 mg    Admin Date 12/31/2016 Action Given Dose 250 mg Route Intramuscular Administered By Maretta BeesMcGlashan, Demauri Advincula J, RMA

## 2017-01-06 ENCOUNTER — Encounter: Payer: Medicaid Other | Admitting: Obstetrics & Gynecology

## 2017-01-08 ENCOUNTER — Ambulatory Visit (HOSPITAL_COMMUNITY): Payer: Medicaid Other

## 2017-01-08 ENCOUNTER — Ambulatory Visit (HOSPITAL_COMMUNITY)
Admission: RE | Admit: 2017-01-08 | Discharge: 2017-01-08 | Disposition: A | Discharge status: Home / Self Care | Payer: Medicaid Other | Source: Ambulatory Visit | Attending: Obstetrics & Gynecology | Admitting: Obstetrics & Gynecology

## 2017-01-08 DIAGNOSIS — Z362 Encounter for other antenatal screening follow-up: Secondary | ICD-10-CM | POA: Diagnosis not present

## 2017-01-08 DIAGNOSIS — Z3A23 23 weeks gestation of pregnancy: Secondary | ICD-10-CM | POA: Diagnosis not present

## 2017-01-08 DIAGNOSIS — O0990 Supervision of high risk pregnancy, unspecified, unspecified trimester: Secondary | ICD-10-CM

## 2017-01-08 DIAGNOSIS — O09212 Supervision of pregnancy with history of pre-term labor, second trimester: Secondary | ICD-10-CM | POA: Diagnosis not present

## 2017-01-11 ENCOUNTER — Ambulatory Visit (INDEPENDENT_AMBULATORY_CARE_PROVIDER_SITE_OTHER): Payer: Medicaid Other | Admitting: Certified Nurse Midwife

## 2017-01-11 VITALS — BP 113/68 | HR 98 | Wt 141.8 lb

## 2017-01-11 DIAGNOSIS — O09899 Supervision of other high risk pregnancies, unspecified trimester: Secondary | ICD-10-CM

## 2017-01-11 DIAGNOSIS — O09212 Supervision of pregnancy with history of pre-term labor, second trimester: Secondary | ICD-10-CM

## 2017-01-11 DIAGNOSIS — O0992 Supervision of high risk pregnancy, unspecified, second trimester: Secondary | ICD-10-CM

## 2017-01-11 DIAGNOSIS — O0990 Supervision of high risk pregnancy, unspecified, unspecified trimester: Secondary | ICD-10-CM

## 2017-01-11 DIAGNOSIS — O09219 Supervision of pregnancy with history of pre-term labor, unspecified trimester: Principal | ICD-10-CM

## 2017-01-11 MED ORDER — HYDROXYPROGESTERONE CAPROATE 250 MG/ML IM OIL
250.0000 mg | TOPICAL_OIL | INTRAMUSCULAR | Status: AC
  Administered 2017-01-11: 250 mg via INTRAMUSCULAR

## 2017-01-11 NOTE — Progress Notes (Signed)
Patient reports good fetal movement, denies pain. Pt received 17p and tolerated well .Marland Kitchen. Administrations This Visit    hydroxyprogesterone caproate (MAKENA) 250 mg/mL injection 250 mg    Admin Date 01/11/2017 Action Given Dose 250 mg Route Intramuscular Administered By Katrina StackStalling, Andreya Lacks D, RN

## 2017-01-11 NOTE — Progress Notes (Signed)
Subjective:  Marisa Stephens is a 26 y.o. [id number] at 4738w0d being seen today for ongoing prenatal care.  She is currently monitored for the following issues for this high-risk pregnancy and has Supervision of high risk pregnancy, antepartum and History of premature delivery, currently pregnant on her problem list.  Patient reports no complaints.  Contractions: Not present. Vag. Bleeding: None.  Movement: Present. Denies leaking of fluid.   The following portions of the patient's history were reviewed and updated as appropriate: allergies, current medications, past family history, past medical history, past social history, past surgical history and problem list. Problem list updated.  Objective:   Vitals:   01/11/17 1517  BP: 113/68  Pulse: 98  Weight: 141 lb 12.8 oz (64.3 kg)    Fetal Status: Fetal Heart Rate (bpm): 145 Fundal Height: 24 cm Movement: Present  Presentation: Undeterminable  General:  Alert, oriented and cooperative. Patient is in no acute distress.  Skin: Skin is warm and dry. No rash noted.   Cardiovascular: Normal heart rate noted  Respiratory: Normal respiratory effort, no problems with respiration noted  Abdomen: Soft, gravid, appropriate for gestational age. Pain/Pressure: Absent     Pelvic: Vag. Bleeding: None     Cervical exam deferred        Extremities: Normal range of motion.  Edema: Trace  Mental Status: Normal mood and affect. Normal behavior. Normal judgment and thought content.   Urinalysis:      Assessment and Plan:  Pregnancy: [id number] at 4238w0d  1. Supervision of high risk pregnancy, antepartum - hydroxyprogesterone caproate (MAKENA) 250 mg/mL injection 250 mg; Inject 1 mL (250 mg total) into the muscle once a week. - GTT next visit  2. History of premature delivery, currently pregnant  Preterm labor symptoms and general obstetric precautions including but not limited to vaginal bleeding, contractions, leaking of fluid and fetal movement were  reviewed in detail with the patient. Please refer to After Visit Summary for other counseling recommendations.  Return in about 4 weeks (around 02/08/2017).   Donette LarryBhambri, Kavontae Pritchard, CNM

## 2017-01-18 ENCOUNTER — Ambulatory Visit: Payer: Medicaid Other

## 2017-01-25 ENCOUNTER — Ambulatory Visit: Payer: Medicaid Other

## 2017-02-01 ENCOUNTER — Ambulatory Visit: Payer: Medicaid Other

## 2017-02-03 ENCOUNTER — Inpatient Hospital Stay (HOSPITAL_COMMUNITY): Payer: Medicaid Other

## 2017-02-03 ENCOUNTER — Encounter (HOSPITAL_COMMUNITY): Payer: Self-pay | Admitting: *Deleted

## 2017-02-03 ENCOUNTER — Inpatient Hospital Stay (HOSPITAL_COMMUNITY)
Admission: AD | Admit: 2017-02-03 | Discharge: 2017-02-03 | Disposition: A | Discharge status: Home / Self Care | Payer: Medicaid Other | Source: Ambulatory Visit | Attending: Family Medicine | Admitting: Family Medicine

## 2017-02-03 DIAGNOSIS — Z3A27 27 weeks gestation of pregnancy: Secondary | ICD-10-CM | POA: Insufficient documentation

## 2017-02-03 DIAGNOSIS — Z79899 Other long term (current) drug therapy: Secondary | ICD-10-CM | POA: Diagnosis not present

## 2017-02-03 DIAGNOSIS — O26873 Cervical shortening, third trimester: Secondary | ICD-10-CM

## 2017-02-03 DIAGNOSIS — O26872 Cervical shortening, second trimester: Secondary | ICD-10-CM | POA: Insufficient documentation

## 2017-02-03 DIAGNOSIS — F17210 Nicotine dependence, cigarettes, uncomplicated: Secondary | ICD-10-CM | POA: Insufficient documentation

## 2017-02-03 DIAGNOSIS — O26892 Other specified pregnancy related conditions, second trimester: Secondary | ICD-10-CM | POA: Diagnosis present

## 2017-02-03 DIAGNOSIS — R109 Unspecified abdominal pain: Secondary | ICD-10-CM | POA: Diagnosis present

## 2017-02-03 LAB — FETAL FIBRONECTIN: Fetal Fibronectin: NEGATIVE

## 2017-02-03 MED ORDER — BETAMETHASONE SOD PHOS & ACET 6 (3-3) MG/ML IJ SUSP
12.0000 mg | Freq: Once | INTRAMUSCULAR | Status: AC
  Administered 2017-02-03: 12 mg via INTRAMUSCULAR
  Filled 2017-02-03: qty 2

## 2017-02-03 NOTE — MAU Provider Note (Signed)
Patient Marisa Stephens is a 26 y.o. [phone] At [redacted]w[redacted]d here with complaints of pelvic pressure for two days. She arrived via EMS.  She denies bleeding, leaking of fluid, decreased fetal movements, dysuria, urgency or other obstetric complaints.   Her history is significant for previous PTB at 35 weeks; she was supposed to getting 17P weekly but has missed her last two appointments because the shot was painful.  She is here to get checked out because she was worried she was in pre-term labor.   History     CSN: [id number]  Arrival date and time: 02/03/17 0057   None     No chief complaint on file.  Abdominal Pain  This is a new problem. The current episode started yesterday. The onset quality is sudden. The problem occurs intermittently. The problem has been unchanged. The pain is located in the suprapubic region. Quality: patient rates it as not pain, but rather pressure.  The abdominal pain does not radiate. Pertinent negatives include no constipation, diarrhea, nausea or vomiting. Nothing aggravates the pain. The pain is relieved by nothing.    OB History    Gravida Para Term Preterm AB Living   6 4 3 1 1 4    SAB TAB Ectopic Multiple Live Births   1     0 4      Past Medical History:  Diagnosis Date  . Former smoker   . Group beta Strep positive 2016  . Hx of chlamydia infection 2010   treated  . 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 Medical history non-contributory   . No pertinent past medical history     Past Surgical History:  Procedure Laterality Date  . NO PAST SURGERIES      Family History  Problem Relation Age of Onset  . Diabetes Maternal Grandmother     Social History  Substance Use Topics  . Smoking status: Current Some Day Smoker    Packs/day: 0.25    Years: 5.00    Types: Cigarettes  . Smokeless tobacco: Never Used  . Alcohol use No    Allergies: No Known Allergies  Prescriptions Prior to Admission  Medication Sig Dispense Refill Last Dose   . acetaminophen (TYLENOL) 325 MG tablet Take 650 mg by mouth every 6 (six) hours as needed for mild pain.   02/02/2017 at Unknown time  . prenatal vitamin w/FE, FA (PRENATAL 1 + 1) 27-1 MG TABS tablet Take 1 tablet by mouth daily at 12 noon. 30 each 3 02/02/2017 at Unknown time  . cephALEXin (KEFLEX) 500 MG capsule Take 1 capsule (500 mg total) by mouth 2 (two) times daily. (Patient not taking: Reported on 10/19/2016) 14 capsule 0 Not Taking  . MedroxyPROGESTERone Acetate 150 MG/ML SUSY Inject 150 mg into the muscle every 3 (three) months.  0 Not Taking  . metroNIDAZOLE (FLAGYL) 500 MG tablet Take 1 tablet (500 mg total) by mouth 2 (two) times daily. (Patient not taking: Reported on 12/09/2016) 14 tablet 0 Not Taking    Review of Systems  Respiratory: Negative.   Cardiovascular: Negative.   Gastrointestinal: Positive for abdominal pain. Negative for constipation, diarrhea, nausea and vomiting.  Genitourinary: Negative.   Neurological: Negative.    Physical Exam   Blood pressure 122/68, pulse 95, temperature 98.3 F (36.8 C), temperature source Oral, resp. rate 18, last menstrual period 07/05/2016, SpO2 100 %, unknown if currently breastfeeding.  Physical Exam  Constitutional: She is oriented to person, place, and time. She appears  well-developed and well-nourished.  HENT:  Head: Normocephalic.  Neck: Normal range of motion.  Respiratory: Effort normal.  GI: Soft. She exhibits no distension and no mass. There is no tenderness. There is no rebound and no guarding.  Genitourinary: Vagina normal.  Genitourinary Comments: NEFG; cervix is 1cm, 70%. No CMT, no adnexal or suprapubic tenderness. Uterine irritability.   Musculoskeletal: Normal range of motion.  Neurological: She is alert and oriented to person, place, and time.  Skin: Skin is warm and dry.  Psychiatric: She has a normal mood and affect.    MAU Course  Procedures  MDM -NST: 145 bpm with present acel, occasional variables,  present acels, and no contractions. Appropriate for 27 week fetus; patient feels positive fetal movements.  -US shows cervical length of 2.5 however with valsalva maneuver patient's cervix changes to 1.6.  -ffn is negative.  -first dose of BMZ given Discussed in detail with the patient the importance of  BMZ follow-up and 17P injections, even if they are painful.  [facility] Dr[facility] Jolayne Pantheronstant at 2 pm on 8-13, but she also needs her GTT. I recommended that patient go to her appt at 2 pm on the 13th and then schedule another lab draw for her GTT.    Assessment and Plan   1. Preterm labor in second trimester without delivery   2. Preterm labor in second trimester   3. Short cervix  3. Patient stable for discharge. [facility] and 17p injection on Thursday, August 9.  4. Patient to resume her 17p injections.  [facility] to update them on Ms. Bircher's plan of care.   I impressed upon the patient the importance of following through on her injections and prenatal care. Patient verbalized understanding; all questions answered. Family was present in the room and supportive.   Charlesetta GaribaldiKathryn Lorraine Elliana Bal CNM 02/03/2017, 3:09 AM

## 2017-02-04 ENCOUNTER — Ambulatory Visit (INDEPENDENT_AMBULATORY_CARE_PROVIDER_SITE_OTHER): Payer: Medicaid Other

## 2017-02-04 ENCOUNTER — Telehealth: Payer: Self-pay

## 2017-02-04 DIAGNOSIS — O26873 Cervical shortening, third trimester: Secondary | ICD-10-CM

## 2017-02-04 DIAGNOSIS — O09213 Supervision of pregnancy with history of pre-term labor, third trimester: Secondary | ICD-10-CM

## 2017-02-04 DIAGNOSIS — O09899 Supervision of other high risk pregnancies, unspecified trimester: Secondary | ICD-10-CM

## 2017-02-04 DIAGNOSIS — O09219 Supervision of pregnancy with history of pre-term labor, unspecified trimester: Principal | ICD-10-CM

## 2017-02-04 MED ORDER — BETAMETHASONE SOD PHOS & ACET 6 (3-3) MG/ML IJ SUSP
12.0000 mg | Freq: Once | INTRAMUSCULAR | Status: AC
  Administered 2017-02-04: 12 mg via INTRAMUSCULAR

## 2017-02-04 MED ORDER — HYDROXYPROGESTERONE CAPROATE 250 MG/ML IM OIL
250.0000 mg | TOPICAL_OIL | Freq: Once | INTRAMUSCULAR | Status: AC
  Administered 2017-02-04: 250 mg via INTRAMUSCULAR

## 2017-02-04 NOTE — Progress Notes (Signed)
Pt here for 17p and BMX second inj. Pt tolerated well.  17p given in left upper quad, and bmx given in right upper quad.

## 2017-02-04 NOTE — Telephone Encounter (Signed)
Called pt left message to schedule appt for today.

## 2017-02-08 ENCOUNTER — Encounter: Payer: Medicaid Other | Admitting: Obstetrics and Gynecology

## 2017-02-11 ENCOUNTER — Ambulatory Visit (INDEPENDENT_AMBULATORY_CARE_PROVIDER_SITE_OTHER): Payer: Medicaid Other | Admitting: Obstetrics and Gynecology

## 2017-02-11 ENCOUNTER — Other Ambulatory Visit: Payer: Medicaid Other

## 2017-02-11 VITALS — BP 119/75 | HR 99 | Wt 143.9 lb

## 2017-02-11 DIAGNOSIS — Z23 Encounter for immunization: Secondary | ICD-10-CM

## 2017-02-11 DIAGNOSIS — O09899 Supervision of other high risk pregnancies, unspecified trimester: Secondary | ICD-10-CM

## 2017-02-11 DIAGNOSIS — O26873 Cervical shortening, third trimester: Secondary | ICD-10-CM | POA: Diagnosis not present

## 2017-02-11 DIAGNOSIS — O0990 Supervision of high risk pregnancy, unspecified, unspecified trimester: Secondary | ICD-10-CM

## 2017-02-11 DIAGNOSIS — O09219 Supervision of pregnancy with history of pre-term labor, unspecified trimester: Secondary | ICD-10-CM

## 2017-02-11 MED ORDER — HYDROXYPROGESTERONE CAPROATE 250 MG/ML IM OIL
250.0000 mg | TOPICAL_OIL | Freq: Once | INTRAMUSCULAR | Status: AC
  Administered 2017-02-11: 250 mg via INTRAMUSCULAR

## 2017-02-11 NOTE — Progress Notes (Signed)
Administrations This Visit    hydroxyprogesterone caproate (MAKENA) 250 mg/mL injection 250 mg    Admin Date 02/11/2017 Action Given Dose 250 mg Route Intramuscular Administered By Maretta BeesMcGlashan, Davisha Linthicum J, RMA

## 2017-02-11 NOTE — Progress Notes (Signed)
   PRENATAL VISIT NOTE  Subjective:  Marisa Stephens is a 26 y.o. [phone] at 6485w3d being seen today for ongoing prenatal care.  She is currently monitored for the following issues for this high-risk pregnancy and has Supervision of high risk pregnancy, antepartum; History of premature delivery, currently pregnant; and Short cervix during pregnancy in third trimester on her problem list.  Patient reports no complaints.  Contractions: Irritability. Vag. Bleeding: None.  Movement: Present. Denies leaking of fluid.   The following portions of the patient's history were reviewed and updated as appropriate: allergies, current medications, past family history, past medical history, past social history, past surgical history and problem list. Problem list updated.  Objective:   Vitals:   02/11/17 0832  BP: 119/75  Pulse: 99  Weight: 143 lb 14.4 oz (65.3 kg)    Fetal Status: Fetal Heart Rate (bpm): 150 Fundal Height: 28 cm Movement: Present     General:  Alert, oriented and cooperative. Patient is in no acute distress.  Skin: Skin is warm and dry. No rash noted.   Cardiovascular: Normal heart rate noted  Respiratory: Normal respiratory effort, no problems with respiration noted  Abdomen: Soft, gravid, appropriate for gestational age.  Pain/Pressure: Present     Pelvic: Cervical exam deferred        Extremities: Normal range of motion.  Edema: Trace  Mental Status:  Normal mood and affect. Normal behavior. Normal judgment and thought content.   Assessment and Plan:  Pregnancy: [id number] at 685w3d  1. Supervision of high risk pregnancy, antepartum Patient is doing well Third trimester labs, glucola and tdap today Patient desires BTL- form signed today - Glucose Tolerance, 2 Hours w/1 Hour - CBC - HIV antibody (with reflex) - RPR  2. Short cervix during pregnancy in third trimester CL 8/8-1.8cm no further follow up required  3. History of premature delivery, currently  pregnant Continue weekly 17-P injections  Preterm labor symptoms and general obstetric precautions including but not limited to vaginal bleeding, contractions, leaking of fluid and fetal movement were reviewed in detail with the patient. Please refer to After Visit Summary for other counseling recommendations.  Return in about 2 weeks (around 02/25/2017) for ROB, weekly for 17-P.   Catalina AntiguaPeggy Jayziah Bankhead, MD

## 2017-02-11 NOTE — Progress Notes (Signed)
TDAP given in Right deltoid. Tolerated well. 17P given in LUOQ. Tolerated well.  Administrations This Visit    hydroxyprogesterone caproate (MAKENA) 250 mg/mL injection 250 mg    Admin Date 02/11/2017 Action Given Dose 250 mg Route Intramuscular Administered By Maretta BeesMcGlashan, Iliana Hutt J, RMA

## 2017-02-12 LAB — GLUCOSE TOLERANCE, 2 HOURS W/ 1HR
GLUCOSE, 1 HOUR: 121 mg/dL (ref 65–179)
GLUCOSE, 2 HOUR: 101 mg/dL (ref 65–152)
Glucose, Fasting: 71 mg/dL (ref 65–91)

## 2017-02-12 LAB — RPR: RPR: NONREACTIVE

## 2017-02-12 LAB — CBC
HEMATOCRIT: 37.6 % (ref 34.0–46.6)
Hemoglobin: 12.4 g/dL (ref 11.1–15.9)
MCH: 33.6 pg — AB (ref 26.6–33.0)
MCHC: 33 g/dL (ref 31.5–35.7)
MCV: 102 fL — ABNORMAL HIGH (ref 79–97)
Platelets: 296 10*3/uL (ref 150–379)
RBC: 3.69 x10E6/uL — ABNORMAL LOW (ref 3.77–5.28)
RDW: 13.3 % (ref 12.3–15.4)
WBC: 10.8 10*3/uL (ref 3.4–10.8)

## 2017-02-12 LAB — HIV ANTIBODY (ROUTINE TESTING W REFLEX): HIV Screen 4th Generation wRfx: NONREACTIVE

## 2017-02-18 ENCOUNTER — Ambulatory Visit (INDEPENDENT_AMBULATORY_CARE_PROVIDER_SITE_OTHER): Payer: Medicaid Other

## 2017-02-18 VITALS — BP 127/74 | HR 99 | Wt 145.0 lb

## 2017-02-18 DIAGNOSIS — O09899 Supervision of other high risk pregnancies, unspecified trimester: Secondary | ICD-10-CM

## 2017-02-18 DIAGNOSIS — O09219 Supervision of pregnancy with history of pre-term labor, unspecified trimester: Principal | ICD-10-CM

## 2017-02-18 DIAGNOSIS — O09213 Supervision of pregnancy with history of pre-term labor, third trimester: Secondary | ICD-10-CM | POA: Diagnosis not present

## 2017-02-18 MED ORDER — HYDROXYPROGESTERONE CAPROATE 250 MG/ML IM OIL
250.0000 mg | TOPICAL_OIL | Freq: Once | INTRAMUSCULAR | Status: AC
  Administered 2017-02-18: 250 mg via INTRAMUSCULAR

## 2017-02-18 NOTE — Progress Notes (Signed)
Patient is in the office for 17p injection Patient reports doing well on injection- she is sore for 2 days at site- then gets better.

## 2017-02-25 ENCOUNTER — Encounter: Payer: Medicaid Other | Admitting: Obstetrics and Gynecology

## 2017-02-26 ENCOUNTER — Encounter: Payer: Self-pay | Admitting: Obstetrics

## 2017-02-26 ENCOUNTER — Ambulatory Visit (INDEPENDENT_AMBULATORY_CARE_PROVIDER_SITE_OTHER): Payer: Medicaid Other | Admitting: Obstetrics

## 2017-02-26 ENCOUNTER — Other Ambulatory Visit (HOSPITAL_COMMUNITY)
Admission: RE | Admit: 2017-02-26 | Discharge: 2017-02-26 | Disposition: A | Discharge status: Home / Self Care | Payer: Medicaid Other | Source: Ambulatory Visit | Attending: Obstetrics | Admitting: Obstetrics

## 2017-02-26 VITALS — BP 105/61 | HR 86 | Wt 149.8 lb

## 2017-02-26 DIAGNOSIS — B369 Superficial mycosis, unspecified: Secondary | ICD-10-CM

## 2017-02-26 DIAGNOSIS — O09213 Supervision of pregnancy with history of pre-term labor, third trimester: Secondary | ICD-10-CM | POA: Diagnosis not present

## 2017-02-26 DIAGNOSIS — O0990 Supervision of high risk pregnancy, unspecified, unspecified trimester: Secondary | ICD-10-CM

## 2017-02-26 DIAGNOSIS — O0993 Supervision of high risk pregnancy, unspecified, third trimester: Secondary | ICD-10-CM | POA: Diagnosis present

## 2017-02-26 DIAGNOSIS — N898 Other specified noninflammatory disorders of vagina: Secondary | ICD-10-CM

## 2017-02-26 DIAGNOSIS — Z3A30 30 weeks gestation of pregnancy: Secondary | ICD-10-CM | POA: Insufficient documentation

## 2017-02-26 DIAGNOSIS — O09219 Supervision of pregnancy with history of pre-term labor, unspecified trimester: Secondary | ICD-10-CM

## 2017-02-26 DIAGNOSIS — O09899 Supervision of other high risk pregnancies, unspecified trimester: Secondary | ICD-10-CM

## 2017-02-26 MED ORDER — HYDROXYPROGESTERONE CAPROATE 250 MG/ML IM OIL
250.0000 mg | TOPICAL_OIL | Freq: Once | INTRAMUSCULAR | Status: AC
  Administered 2017-02-26: 250 mg via INTRAMUSCULAR

## 2017-02-26 MED ORDER — CLOTRIMAZOLE 1 % EX CREA: 1.0000 "application " | TOPICAL_CREAM | Freq: Two times a day (BID) | CUTANEOUS | 1 refills | Status: DC

## 2017-02-26 NOTE — Progress Notes (Signed)
Subjective:  Marisa Stephens is a 26 y.o. [phone] at 3737w4d being seen today for ongoing prenatal care.  She is currently monitored for the following issues for this high risk pregnancy and has Supervision of high risk pregnancy, antepartum; History of premature delivery, currently pregnant; and Short cervix during pregnancy in third trimester on her problem list.  Patient reports perianal itching.  Contractions: Not present. Vag. Bleeding: None.  Movement: Present. Denies leaking of fluid.   The following portions of the patient's history were reviewed and updated as appropriate: allergies, current medications, past family history, past medical history, past social history, past surgical history and problem list. Problem list updated.  Objective:   Vitals:   02/26/17 1028  BP: 105/61  Pulse: 86  Weight: 149 lb 12.8 oz (67.9 kg)    Fetal Status: Fetal Heart Rate (bpm): 150   Movement: Present     General:  Alert, oriented and cooperative. Patient is in no acute distress.  Skin: Skin is warm and dry. No rash noted.   Cardiovascular: Normal heart rate noted  Respiratory: Normal respiratory effort, no problems with respiration noted  Abdomen: Soft, gravid, appropriate for gestational age. Pain/Pressure: Present     Pelvic:  Cervical exam deferred        Extremities: Normal range of motion.  Edema: None  Mental Status: Normal mood and affect. Normal behavior. Normal judgment and thought content.   Urinalysis:      Assessment and Plan:  Pregnancy: [id number] at 737w4d  1. Supervision of high risk pregnancy, antepartum   2. History of premature delivery, currently pregnant Rx - hydroxyprogesterone caproate (MAKENA) 250 mg/mL injection 250 mg; Inject 1 mL (250 mg total) into the muscle once.  3. Superficial fungus infection of skin Rx - clotrimazole (LOTRIMIN) 1 % cream; Apply 1 application topically 2 (two) times daily.  Dispense: 113 g; Refill: 1  4. Vaginal discharge Rx -  Cervicovaginal ancillary only  Preterm labor symptoms and general obstetric precautions including but not limited to vaginal bleeding, contractions, leaking of fluid and fetal movement were reviewed in detail with the patient. Please refer to After Visit Summary for other counseling recommendations.  Return in about 2 weeks (around 03/12/2017) for ROB.   Brock BadHarper, Antania Hoefling A, MD

## 2017-02-26 NOTE — Progress Notes (Signed)
Patient reports she does have some pressure. She also reports she is irritated on her bottom.

## 2017-03-04 ENCOUNTER — Ambulatory Visit: Payer: Self-pay

## 2017-03-04 LAB — CERVICOVAGINAL ANCILLARY ONLY
BACTERIAL VAGINITIS: NEGATIVE
CANDIDA VAGINITIS: NEGATIVE
Chlamydia: NEGATIVE
NEISSERIA GONORRHEA: NEGATIVE
TRICH (WINDOWPATH): NEGATIVE

## 2017-03-12 ENCOUNTER — Ambulatory Visit (INDEPENDENT_AMBULATORY_CARE_PROVIDER_SITE_OTHER): Payer: Medicaid Other | Admitting: Obstetrics

## 2017-03-12 ENCOUNTER — Encounter: Payer: Self-pay | Admitting: Obstetrics

## 2017-03-12 VITALS — BP 112/74 | HR 92 | Wt 154.4 lb

## 2017-03-12 DIAGNOSIS — O0993 Supervision of high risk pregnancy, unspecified, third trimester: Secondary | ICD-10-CM

## 2017-03-12 DIAGNOSIS — O09213 Supervision of pregnancy with history of pre-term labor, third trimester: Secondary | ICD-10-CM

## 2017-03-12 DIAGNOSIS — O09899 Supervision of other high risk pregnancies, unspecified trimester: Secondary | ICD-10-CM

## 2017-03-12 DIAGNOSIS — O09219 Supervision of pregnancy with history of pre-term labor, unspecified trimester: Secondary | ICD-10-CM

## 2017-03-12 DIAGNOSIS — O26873 Cervical shortening, third trimester: Secondary | ICD-10-CM

## 2017-03-12 MED ORDER — HYDROXYPROGESTERONE CAPROATE 250 MG/ML IM OIL
250.0000 mg | TOPICAL_OIL | INTRAMUSCULAR | Status: DC
  Administered 2017-03-12 – 2017-04-08 (×3): 250 mg via INTRAMUSCULAR

## 2017-03-12 NOTE — Progress Notes (Signed)
Patient is in the office, reports good fetal movement, administered 17-p and pt tolerated well.

## 2017-03-12 NOTE — Progress Notes (Signed)
Subjective:  Marisa Stephens is a 26 y.o. [phone] at [redacted]w[redacted]d being seen today for ongoing prenatal care.  She is currently monitored for the following issues for this high-risk pregnancy and has Supervision of high risk pregnancy, antepartum; History of premature delivery, currently pregnant; and Short cervix during pregnancy in third trimester on her problem list.  Patient reports no complaints.  Contractions: Irregular. Vag. Bleeding: None.  Movement: Present. Denies leaking of fluid.   The following portions of the patient's history were reviewed and updated as appropriate: allergies, current medications, past family history, past medical history, past social history, past surgical history and problem list. Problem list updated.  Objective:   Vitals:   03/12/17 0858  BP: 112/74  Pulse: 92  Weight: 154 lb 6.4 oz (70 kg)    Fetal Status: Fetal Heart Rate (bpm): 150   Movement: Present     General:  Alert, oriented and cooperative. Patient is in no acute distress.  Skin: Skin is warm and dry. No rash noted.   Cardiovascular: Normal heart rate noted  Respiratory: Normal respiratory effort, no problems with respiration noted  Abdomen: Soft, gravid, appropriate for gestational age. Pain/Pressure: Present     Pelvic:  Cervical exam deferred        Extremities: Normal range of motion.  Edema: Trace  Mental Status: Normal mood and affect. Normal behavior. Normal judgment and thought content.   Urinalysis:      Assessment and Plan:  Pregnancy: [id number] at [redacted]w[redacted]d   1. Supervision of high risk pregnancy in third trimester Rx: - hydroxyprogesterone caproate (MAKENA) 250 mg/mL injection 250 mg; Inject 1 mL (250 mg total) into the muscle once a week.  2. History of premature delivery, currently pregnant   3. Short cervix during pregnancy in third trimester  Preterm labor symptoms and general obstetric precautions including but not limited to vaginal bleeding, contractions, leaking of  fluid and fetal movement were reviewed in detail with the patient. Please refer to After Visit Summary for other counseling recommendations.  Return in about 1 week (around 03/19/2017) for ROB and 17-P.   Brock Bad, MD

## 2017-03-18 ENCOUNTER — Ambulatory Visit: Payer: Medicaid Other

## 2017-03-25 ENCOUNTER — Ambulatory Visit (INDEPENDENT_AMBULATORY_CARE_PROVIDER_SITE_OTHER): Payer: Medicaid Other | Admitting: Obstetrics & Gynecology

## 2017-03-25 VITALS — BP 112/72 | HR 96 | Wt 156.0 lb

## 2017-03-25 DIAGNOSIS — O09213 Supervision of pregnancy with history of pre-term labor, third trimester: Secondary | ICD-10-CM | POA: Diagnosis not present

## 2017-03-25 DIAGNOSIS — O0993 Supervision of high risk pregnancy, unspecified, third trimester: Secondary | ICD-10-CM

## 2017-03-25 DIAGNOSIS — O0990 Supervision of high risk pregnancy, unspecified, unspecified trimester: Secondary | ICD-10-CM

## 2017-03-25 NOTE — Progress Notes (Signed)
   PRENATAL VISIT NOTE  Subjective:  Marisa Stephens is a 26 y.o. [phone] at [redacted]w[redacted]d being seen today for ongoing prenatal care.  She is currently monitored for the following issues for this high-risk pregnancy and has Supervision of high risk pregnancy, antepartum; History of premature delivery, currently pregnant; and Short cervix during pregnancy in third trimester on her problem list.  Patient reports no complaints.  Contractions: Not present. Vag. Bleeding: None.  Movement: Present. Denies leaking of fluid.   The following portions of the patient's history were reviewed and updated as appropriate: allergies, current medications, past family history, past medical history, past social history, past surgical history and problem list. Problem list updated.  Objective:   Vitals:   03/25/17 1146  BP: 112/72  Pulse: 96  Weight: 156 lb (70.8 kg)    Fetal Status: Fetal Heart Rate (bpm): 150 Fundal Height: 34 cm Movement: Present     General:  Alert, oriented and cooperative. Patient is in no acute distress.  Skin: Skin is warm and dry. No rash noted.   Cardiovascular: Normal heart rate noted  Respiratory: Normal respiratory effort, no problems with respiration noted  Abdomen: Soft, gravid, appropriate for gestational age.  Pain/Pressure: Absent     Pelvic: Cervical exam deferred        Extremities: Normal range of motion.     Mental Status:  Normal mood and affect. Normal behavior. Normal judgment and thought content.   Assessment and Plan:  Pregnancy: [id number] at [redacted]w[redacted]d  1. Supervision of high risk pregnancy, antepartum   Preterm labor symptoms and general obstetric precautions including but not limited to vaginal bleeding, contractions, leaking of fluid and fetal movement were reviewed in detail with the patient. Please refer to After Visit Summary for other counseling recommendations.  Return in about 2 weeks (around 04/08/2017).   Scheryl Darter, MD

## 2017-03-25 NOTE — Progress Notes (Signed)
Pt was given 17p injection at today's visit. Pt tolerated injection well.  Administrations This Visit    hydroxyprogesterone caproate (MAKENA) 250 mg/mL injection 250 mg    Admin Date 03/25/2017 Action Given Dose 250 mg Route Intramuscular Administered By Lanney Gins, CMA

## 2017-03-25 NOTE — Patient Instructions (Signed)
Third Trimester of Pregnancy The third trimester is from week 28 through week 40 (months 7 through 9). The third trimester is a time when the unborn baby (fetus) is growing rapidly. At the end of the ninth month, the fetus is about 20 inches in length and weighs 6-10 pounds. Body changes during your third trimester Your body will continue to go through many changes during pregnancy. The changes vary from woman to woman. During the third trimester:  Your weight will continue to increase. You can expect to gain 25-35 pounds (11-16 kg) by the end of the pregnancy.  You may begin to get stretch marks on your hips, abdomen, and breasts.  You may urinate more often because the fetus is moving lower into your pelvis and pressing on your bladder.  You may develop or continue to have heartburn. This is caused by increased hormones that slow down muscles in the digestive tract.  You may develop or continue to have constipation because increased hormones slow digestion and cause the muscles that push waste through your intestines to relax.  You may develop hemorrhoids. These are swollen veins (varicose veins) in the rectum that can itch or be painful.  You may develop swollen, bulging veins (varicose veins) in your legs.  You may have increased body aches in the pelvis, back, or thighs. This is due to weight gain and increased hormones that are relaxing your joints.  You may have changes in your hair. These can include thickening of your hair, rapid growth, and changes in texture. Some women also have hair loss during or after pregnancy, or hair that feels dry or thin. Your hair will most likely return to normal after your baby is born.  Your breasts will continue to grow and they will continue to become tender. A yellow fluid (colostrum) may leak from your breasts. This is the first milk you are producing for your baby.  Your belly button may stick out.  You may notice more swelling in your hands,  face, or ankles.  You may have increased tingling or numbness in your hands, arms, and legs. The skin on your belly may also feel numb.  You may feel short of breath because of your expanding uterus.  You may have more problems sleeping. This can be caused by the size of your belly, increased need to urinate, and an increase in your body's metabolism.  You may notice the fetus "dropping," or moving lower in your abdomen (lightening).  You may have increased vaginal discharge.  You may notice your joints feel loose and you may have pain around your pelvic bone.  What to expect at prenatal visits You will have prenatal exams every 2 weeks until week 36. Then you will have weekly prenatal exams. During a routine prenatal visit:  You will be weighed to make sure you and the baby are growing normally.  Your blood pressure will be taken.  Your abdomen will be measured to track your baby's growth.  The fetal heartbeat will be listened to.  Any test results from the previous visit will be discussed.  You may have a cervical check near your due date to see if your cervix has softened or thinned (effaced).  You will be tested for Group B streptococcus. This happens between 35 and 37 weeks.  Your health care provider may ask you:  What your birth plan is.  How you are feeling.  If you are feeling the baby move.  If you have had   any abnormal symptoms, such as leaking fluid, bleeding, severe headaches, or abdominal cramping.  If you are using any tobacco products, including cigarettes, chewing tobacco, and electronic cigarettes.  If you have any questions.  Other tests or screenings that may be performed during your third trimester include:  Blood tests that check for low iron levels (anemia).  Fetal testing to check the health, activity level, and growth of the fetus. Testing is done if you have certain medical conditions or if there are problems during the  pregnancy.  Nonstress test (NST). This test checks the health of your baby to make sure there are no signs of problems, such as the baby not getting enough oxygen. During this test, a belt is placed around your belly. The baby is made to move, and its heart rate is monitored during movement.  What is false labor? False labor is a condition in which you feel small, irregular tightenings of the muscles in the womb (contractions) that usually go away with rest, changing position, or drinking water. These are called Braxton Hicks contractions. Contractions may last for hours, days, or even weeks before true labor sets in. If contractions come at regular intervals, become more frequent, increase in intensity, or become painful, you should see your health care provider. What are the signs of labor?  Abdominal cramps.  Regular contractions that start at 10 minutes apart and become stronger and more frequent with time.  Contractions that start on the top of the uterus and spread down to the lower abdomen and back.  Increased pelvic pressure and dull back pain.  A watery or bloody mucus discharge that comes from the vagina.  Leaking of amniotic fluid. This is also known as your "water breaking." It could be a slow trickle or a gush. Let your health care provider know if it has a color or strange odor. If you have any of these signs, call your health care provider right away, even if it is before your due date. Follow these instructions at home: Medicines  Follow your health care provider's instructions regarding medicine use. Specific medicines may be either safe or unsafe to take during pregnancy.  Take a prenatal vitamin that contains at least 600 micrograms (mcg) of folic acid.  If you develop constipation, try taking a stool softener if your health care provider approves. Eating and drinking  Eat a balanced diet that includes fresh fruits and vegetables, whole grains, good sources of protein  such as meat, eggs, or tofu, and low-fat dairy. Your health care provider will help you determine the amount of weight gain that is right for you.  Avoid raw meat and uncooked cheese. These carry germs that can cause birth defects in the baby.  If you have low calcium intake from food, talk to your health care provider about whether you should take a daily calcium supplement.  Eat four or five small meals rather than three large meals a day.  Limit foods that are high in fat and processed sugars, such as fried and sweet foods.  To prevent constipation: ? Drink enough fluid to keep your urine clear or pale yellow. ? Eat foods that are high in fiber, such as fresh fruits and vegetables, whole grains, and beans. Activity  Exercise only as directed by your health care provider. Most women can continue their usual exercise routine during pregnancy. Try to exercise for 30 minutes at least 5 days a week. Stop exercising if you experience uterine contractions.  Avoid heavy   lifting.  Do not exercise in extreme heat or humidity, or at high altitudes.  Wear low-heel, comfortable shoes.  Practice good posture.  You may continue to have sex unless your health care provider tells you otherwise. Relieving pain and discomfort  Take frequent breaks and rest with your legs elevated if you have leg cramps or low back pain.  Take warm sitz baths to soothe any pain or discomfort caused by hemorrhoids. Use hemorrhoid cream if your health care provider approves.  Wear a good support bra to prevent discomfort from breast tenderness.  If you develop varicose veins: ? Wear support pantyhose or compression stockings as told by your healthcare provider. ? Elevate your feet for 15 minutes, 3-4 times a day. Prenatal care  Write down your questions. Take them to your prenatal visits.  Keep all your prenatal visits as told by your health care provider. This is important. Safety  Wear your seat belt at  all times when driving.  Make a list of emergency phone numbers, including numbers for family, friends, the hospital, and police and fire departments. General instructions  Avoid cat litter boxes and soil used by cats. These carry germs that can cause birth defects in the baby. If you have a cat, ask someone to clean the litter box for you.  Do not travel far distances unless it is absolutely necessary and only with the approval of your health care provider.  Do not use hot tubs, steam rooms, or saunas.  Do not drink alcohol.  Do not use any products that contain nicotine or tobacco, such as cigarettes and e-cigarettes. If you need help quitting, ask your health care provider.  Do not use any medicinal herbs or unprescribed drugs. These chemicals affect the formation and growth of the baby.  Do not douche or use tampons or scented sanitary pads.  Do not cross your legs for long periods of time.  To prepare for the arrival of your baby: ? Take prenatal classes to understand, practice, and ask questions about labor and delivery. ? Make a trial run to the hospital. ? Visit the hospital and tour the maternity area. ? Arrange for maternity or paternity leave through employers. ? Arrange for family and friends to take care of pets while you are in the hospital. ? Purchase a rear-facing car seat and make sure you know how to install it in your car. ? Pack your hospital bag. ? Prepare the baby's nursery. Make sure to remove all pillows and stuffed animals from the baby's crib to prevent suffocation.  Visit your dentist if you have not gone during your pregnancy. Use a soft toothbrush to brush your teeth and be gentle when you floss. Contact a health care provider if:  You are unsure if you are in labor or if your water has broken.  You become dizzy.  You have mild pelvic cramps, pelvic pressure, or nagging pain in your abdominal area.  You have lower back pain.  You have persistent  nausea, vomiting, or diarrhea.  You have an unusual or bad smelling vaginal discharge.  You have pain when you urinate. Get help right away if:  Your water breaks before 37 weeks.  You have regular contractions less than 5 minutes apart before 37 weeks.  You have a fever.  You are leaking fluid from your vagina.  You have spotting or bleeding from your vagina.  You have severe abdominal pain or cramping.  You have rapid weight loss or weight gain.    You have shortness of breath with chest pain.  You notice sudden or extreme swelling of your face, hands, ankles, feet, or legs.  Your baby makes fewer than 10 movements in 2 hours.  You have severe headaches that do not go away when you take medicine.  You have vision changes. Summary  The third trimester is from week 28 through week 40, months 7 through 9. The third trimester is a time when the unborn baby (fetus) is growing rapidly.  During the third trimester, your discomfort may increase as you and your baby continue to gain weight. You may have abdominal, leg, and back pain, sleeping problems, and an increased need to urinate.  During the third trimester your breasts will keep growing and they will continue to become tender. A yellow fluid (colostrum) may leak from your breasts. This is the first milk you are producing for your baby.  False labor is a condition in which you feel small, irregular tightenings of the muscles in the womb (contractions) that eventually go away. These are called Braxton Hicks contractions. Contractions may last for hours, days, or even weeks before true labor sets in.  Signs of labor can include: abdominal cramps; regular contractions that start at 10 minutes apart and become stronger and more frequent with time; watery or bloody mucus discharge that comes from the vagina; increased pelvic pressure and dull back pain; and leaking of amniotic fluid. This information is not intended to replace advice  given to you by your health care provider. Make sure you discuss any questions you have with your health care provider. Document Released: 06/09/2001 Document Revised: 11/21/2015 Document Reviewed: 08/16/2012 [employer] Interactive Patient Education  2017 [employer]  

## 2017-03-31 ENCOUNTER — Ambulatory Visit: Payer: Medicaid Other

## 2017-04-08 ENCOUNTER — Encounter: Payer: Medicaid Other | Admitting: Obstetrics and Gynecology

## 2017-04-08 ENCOUNTER — Other Ambulatory Visit (HOSPITAL_COMMUNITY)
Admission: RE | Admit: 2017-04-08 | Discharge: 2017-04-08 | Disposition: A | Discharge status: Home / Self Care | Payer: Medicaid Other | Source: Ambulatory Visit | Attending: Obstetrics and Gynecology | Admitting: Obstetrics and Gynecology

## 2017-04-08 ENCOUNTER — Ambulatory Visit (INDEPENDENT_AMBULATORY_CARE_PROVIDER_SITE_OTHER): Payer: Medicaid Other | Admitting: Obstetrics and Gynecology

## 2017-04-08 VITALS — BP 108/69 | HR 102 | Wt 158.0 lb

## 2017-04-08 DIAGNOSIS — Z3009 Encounter for other general counseling and advice on contraception: Secondary | ICD-10-CM

## 2017-04-08 DIAGNOSIS — O0990 Supervision of high risk pregnancy, unspecified, unspecified trimester: Secondary | ICD-10-CM

## 2017-04-08 DIAGNOSIS — O26873 Cervical shortening, third trimester: Secondary | ICD-10-CM

## 2017-04-08 DIAGNOSIS — Z23 Encounter for immunization: Secondary | ICD-10-CM

## 2017-04-08 DIAGNOSIS — O09213 Supervision of pregnancy with history of pre-term labor, third trimester: Secondary | ICD-10-CM

## 2017-04-08 DIAGNOSIS — O09899 Supervision of other high risk pregnancies, unspecified trimester: Secondary | ICD-10-CM

## 2017-04-08 DIAGNOSIS — O09219 Supervision of pregnancy with history of pre-term labor, unspecified trimester: Principal | ICD-10-CM

## 2017-04-08 DIAGNOSIS — O0993 Supervision of high risk pregnancy, unspecified, third trimester: Secondary | ICD-10-CM

## 2017-04-08 HISTORY — DX: Encounter for other general counseling and advice on contraception: Z30.09

## 2017-04-08 NOTE — Progress Notes (Signed)
Subjective:  Marisa Stephens is a 26 y.o. [id number] at [redacted]w[redacted]d being seen today for ongoing prenatal care.  She is currently monitored for the following issues for this high-risk pregnancy and has Supervision of high risk pregnancy, antepartum; History of premature delivery, currently pregnant; Short cervix during pregnancy in third trimester; and Unwanted fertility on her problem list.  Patient reports no complaints.  Contractions: Irregular. Vag. Bleeding: None.  Movement: Present. Denies leaking of fluid.   The following portions of the patient's history were reviewed and updated as appropriate: allergies, current medications, past family history, past medical history, past social history, past surgical history and problem list. Problem list updated.  Objective:   Vitals:   04/08/17 1431  BP: 108/69  Pulse: (!) 102  Weight: 158 lb (71.7 kg)    Fetal Status:     Movement: Present     General:  Alert, oriented and cooperative. Patient is in no acute distress.  Skin: Skin is warm and dry. No rash noted.   Cardiovascular: Normal heart rate noted  Respiratory: Normal respiratory effort, no problems with respiration noted  Abdomen: Soft, gravid, appropriate for gestational age. Pain/Pressure: Absent     Pelvic:  Cervical exam performed        Extremities: Normal range of motion.  Edema: Trace  Mental Status: Normal mood and affect. Normal behavior. Normal judgment and thought content.   Urinalysis:      Assessment and Plan:  Pregnancy: [id number] at [redacted]w[redacted]d  1. Supervision of high risk pregnancy, antepartum Stable GBS and vaginal cultures today  2. History of premature delivery, currently pregnant 16 OHP  3. Short cervix during pregnancy in third trimester See above  4. Unwanted fertility BTL papers already signed  Term labor symptoms and general obstetric precautions including but not limited to vaginal bleeding, contractions, leaking of fluid and fetal movement were reviewed in  detail with the patient. Please refer to After Visit Summary for other counseling recommendations.  Return in about 1 week (around 04/15/2017) for OB visit.   Hermina Staggers, MD

## 2017-04-08 NOTE — Progress Notes (Signed)
Patient reports good fetal movement with irregular contractions.  

## 2017-04-08 NOTE — Addendum Note (Signed)
Addended by: Natale Milch D on: 04/08/2017 02:54 PM   Modules accepted: Orders

## 2017-04-08 NOTE — Patient Instructions (Signed)
Vaginal Delivery Vaginal delivery means that you will give birth by pushing your baby out of your birth canal (vagina). A team of health care providers will help you before, during, and after vaginal delivery. Birth experiences are unique for every woman and every pregnancy, and birth experiences vary depending on where you choose to give birth. What should I do to prepare for my baby's birth? Before your baby is born, it is important to talk with your health care provider about:  Your labor and delivery preferences. These may include: ? Medicines that you may be given. ? How you will manage your pain. This might include non-medical pain relief techniques or injectable pain relief such as epidural analgesia. ? How you and your baby will be monitored during labor and delivery. ? Who may be in the labor and delivery room with you. ? Your feelings about surgical delivery of your baby (cesarean delivery, or C-section) if this becomes necessary. ? Your feelings about receiving donated blood through an IV tube (blood transfusion) if this becomes necessary.  Whether you are able: ? To take pictures or videos of the birth. ? To eat during labor and delivery. ? To move around, walk, or change positions during labor and delivery.  What to expect after your baby is born, such as: ? Whether delayed umbilical cord clamping and cutting is offered. ? Who will care for your baby right after birth. ? Medicines or tests that may be recommended for your baby. ? Whether breastfeeding is supported in your hospital or birth center. ? How long you will be in the hospital or birth center.  How any medical conditions you have may affect your baby or your labor and delivery experience.  To prepare for your baby's birth, you should also:  Attend all of your health care visits before delivery (prenatal visits) as recommended by your health care provider. This is important.  Prepare your home for your baby's  arrival. Make sure that you have: ? Diapers. ? Baby clothing. ? Feeding equipment. ? Safe sleeping arrangements for you and your baby.  Install a car seat in your vehicle. Have your car seat checked by a certified car seat installer to make sure that it is installed safely.  Think about who will help you with your new baby at home for at least the first several weeks after delivery.  What can I expect when I arrive at the birth center or hospital? Once you are in labor and have been admitted into the hospital or birth center, your health care provider may:  Review your pregnancy history and any concerns you have.  Insert an IV tube into one of your veins. This is used to give you fluids and medicines.  Check your blood pressure, pulse, temperature, and heart rate (vital signs).  Check whether your bag of water (amniotic sac) has broken (ruptured).  Talk with you about your birth plan and discuss pain control options.  Monitoring Your health care provider may monitor your contractions (uterine monitoring) and your baby's heart rate (fetal monitoring). You may need to be monitored:  Often, but not continuously (intermittently).  All the time or for long periods at a time (continuously). Continuous monitoring may be needed if: ? You are taking certain medicines, such as medicine to relieve pain or make your contractions stronger. ? You have pregnancy or labor complications.  Monitoring may be done by:  Placing a special stethoscope or a handheld monitoring device on your abdomen to   check your baby's heartbeat, and feeling your abdomen for contractions. This method of monitoring does not continuously record your baby's heartbeat or your contractions.  Placing monitors on your abdomen (external monitors) to record your baby's heartbeat and the frequency and length of contractions. You may not have to wear external monitors all the time.  Placing monitors inside of your uterus  (internal monitors) to record your baby's heartbeat and the frequency, length, and strength of your contractions. ? Your health care provider may use internal monitors if he or she needs more information about the strength of your contractions or your baby's heart rate. ? Internal monitors are put in place by passing a thin, flexible wire through your vagina and into your uterus. Depending on the type of monitor, it may remain in your uterus or on your baby's head until birth. ? Your health care provider will discuss the benefits and risks of internal monitoring with you and will ask for your permission before inserting the monitors.  Telemetry. This is a type of continuous monitoring that can be done with external or internal monitors. Instead of having to stay in bed, you are able to move around during telemetry. Ask your health care provider if telemetry is an option for you.  Physical exam Your health care provider may perform a physical exam. This may include:  Checking whether your baby is positioned: ? With the head toward your vagina (head-down). This is most common. ? With the head toward the top of your uterus (head-up or breech). If your baby is in a breech position, your health care provider may try to turn your baby to a head-down position so you can deliver vaginally. If it does not seem that your baby can be born vaginally, your provider may recommend surgery to deliver your baby. In rare cases, you may be able to deliver vaginally if your baby is head-up (breech delivery). ? Lying sideways (transverse). Babies that are lying sideways cannot be delivered vaginally.  Checking your cervix to determine: ? Whether it is thinning out (effacing). ? Whether it is opening up (dilating). ? How low your baby has moved into your birth canal.  What are the three stages of labor and delivery?  Normal labor and delivery is divided into the following three stages: Stage 1  Stage 1 is the  longest stage of labor, and it can last for hours or days. Stage 1 includes: ? Early labor. This is when contractions may be irregular, or regular and mild. Generally, early labor contractions are more than 10 minutes apart. ? Active labor. This is when contractions get longer, more regular, more frequent, and more intense. ? The transition phase. This is when contractions happen very close together, are very intense, and may last longer than during any other part of labor.  Contractions generally feel mild, infrequent, and irregular at first. They get stronger, more frequent (about every 2-3 minutes), and more regular as you progress from early labor through active labor and transition.  Many women progress through stage 1 naturally, but you may need help to continue making progress. If this happens, your health care provider may talk with you about: ? Rupturing your amniotic sac if it has not ruptured yet. ? Giving you medicine to help make your contractions stronger and more frequent.  Stage 1 ends when your cervix is completely dilated to 4 inches (10 cm) and completely effaced. This happens at the end of the transition phase. Stage 2  Once   your cervix is completely effaced and dilated to 4 inches (10 cm), you may start to feel an urge to push. It is common for the body to naturally take a rest before feeling the urge to push, especially if you received an epidural or certain other pain medicines. This rest period may last for up to 1-2 hours, depending on your unique labor experience.  During stage 2, contractions are generally less painful, because pushing helps relieve contraction pain. Instead of contraction pain, you may feel stretching and burning pain, especially when the widest part of your baby's head passes through the vaginal opening (crowning).  Your health care provider will closely monitor your pushing progress and your baby's progress through the vagina during stage 2.  Your  health care provider may massage the area of skin between your vaginal opening and anus (perineum) or apply warm compresses to your perineum. This helps it stretch as the baby's head starts to crown, which can help prevent perineal tearing. ? In some cases, an incision may be made in your perineum (episiotomy) to allow the baby to pass through the vaginal opening. An episiotomy helps to make the opening of the vagina larger to allow more room for the baby to fit through.  It is very important to breathe and focus so your health care provider can control the delivery of your baby's head. Your health care provider may have you decrease the intensity of your pushing, to help prevent perineal tearing.  After delivery of your baby's head, the shoulders and the rest of the body generally deliver very quickly and without difficulty.  Once your baby is delivered, the umbilical cord may be cut right away, or this may be delayed for 1-2 minutes, depending on your baby's health. This may vary among health care providers, hospitals, and birth centers.  If you and your baby are healthy enough, your baby may be placed on your chest or abdomen to help maintain the baby's temperature and to help you bond with each other. Some mothers and babies start breastfeeding at this time. Your health care team will dry your baby and help keep your baby warm during this time.  Your baby may need immediate care if he or she: ? Showed signs of distress during labor. ? Has a medical condition. ? Was born too early (prematurely). ? Had a bowel movement before birth (meconium). ? Shows signs of difficulty transitioning from being inside the uterus to being outside of the uterus. If you are planning to breastfeed, your health care team will help you begin a feeding. Stage 3  The third stage of labor starts immediately after the birth of your baby and ends after you deliver the placenta. The placenta is an organ that develops  during pregnancy to provide oxygen and nutrients to your baby in the womb.  Delivering the placenta may require some pushing, and you may have mild contractions. Breastfeeding can stimulate contractions to help you deliver the placenta.  After the placenta is delivered, your uterus should tighten (contract) and become firm. This helps to stop bleeding in your uterus. To help your uterus contract and to control bleeding, your health care provider may: ? Give you medicine by injection, through an IV tube, by mouth, or through your rectum (rectally). ? Massage your abdomen or perform a vaginal exam to remove any blood clots that are left in your uterus. ? Empty your bladder by placing a thin, flexible tube (catheter) into your bladder. ? Encourage   you to breastfeed your baby. After labor is over, you and your baby will be monitored closely to ensure that you are both healthy until you are ready to go home. Your health care team will teach you how to care for yourself and your baby. This information is not intended to replace advice given to you by your health care provider. Make sure you discuss any questions you have with your health care provider. Document Released: 03/24/2008 Document Revised: 01/03/2016 Document Reviewed: 06/30/2015 Elsevier Interactive Patient Education  2018 [employer]  

## 2017-04-09 LAB — CERVICOVAGINAL ANCILLARY ONLY
Bacterial vaginitis: NEGATIVE
CHLAMYDIA, DNA PROBE: NEGATIVE
Candida vaginitis: NEGATIVE
Neisseria Gonorrhea: NEGATIVE
Trichomonas: NEGATIVE

## 2017-04-10 LAB — STREP GP B NAA: Strep Gp B NAA: POSITIVE — AB

## 2017-04-15 ENCOUNTER — Ambulatory Visit (INDEPENDENT_AMBULATORY_CARE_PROVIDER_SITE_OTHER): Payer: Medicaid Other | Admitting: Obstetrics & Gynecology

## 2017-04-15 VITALS — BP 115/74 | HR 83 | Wt 158.0 lb

## 2017-04-15 DIAGNOSIS — O09219 Supervision of pregnancy with history of pre-term labor, unspecified trimester: Principal | ICD-10-CM

## 2017-04-15 DIAGNOSIS — O09213 Supervision of pregnancy with history of pre-term labor, third trimester: Secondary | ICD-10-CM

## 2017-04-15 DIAGNOSIS — O0990 Supervision of high risk pregnancy, unspecified, unspecified trimester: Secondary | ICD-10-CM

## 2017-04-15 DIAGNOSIS — O09899 Supervision of other high risk pregnancies, unspecified trimester: Secondary | ICD-10-CM

## 2017-04-15 DIAGNOSIS — O0993 Supervision of high risk pregnancy, unspecified, third trimester: Secondary | ICD-10-CM

## 2017-04-15 DIAGNOSIS — O26873 Cervical shortening, third trimester: Secondary | ICD-10-CM

## 2017-04-15 DIAGNOSIS — O99820 Streptococcus B carrier state complicating pregnancy: Secondary | ICD-10-CM

## 2017-04-15 NOTE — Patient Instructions (Signed)
Group B Streptococcus Infection During Pregnancy Group B Streptococcus (GBS) is a type of bacteria (Streptococcus agalactiae) that is often found in healthy people, commonly in the rectum, vagina, and intestines. In people who are healthy and not pregnant, the bacteria rarely cause serious illness or complications. However, women who test positive for GBS during pregnancy can pass the bacteria to their baby during childbirth, which can cause serious infection in the baby after birth. Women with GBS may also have infections during their pregnancy or immediately after childbirth, such as such as urinary tract infections (UTIs) or infections of the uterus (uterine infections). Having GBS also increases a woman's risk of complications during pregnancy, such as early (preterm) labor or delivery, miscarriage, or stillbirth. Routine testing (screening) for GBS is recommended for all pregnant women. What increases the risk? You may have a higher risk for GBS infection during pregnancy if you had one during a past pregnancy. What are the signs or symptoms? In most cases, GBS infection does not cause symptoms in pregnant women. Signs and symptoms of a possible GBS-related infection may include:  Labor starting before the 37th week of pregnancy.  A UTI or bladder infection, which may cause: ? Fever. ? Pain or burning during urination. ? Frequent urination.  Fever during labor, along with: ? Bad-smelling discharge. ? Uterine tenderness. ? Rapid heartbeat in the mother, baby, or both.  Rare but serious symptoms of a possible GBS-related infection in women include:  Blood infection (septicemia). This may cause fever, chills, or confusion.  Lung infection (pneumonia). This may cause fever, chills, cough, rapid breathing, difficulty breathing, or chest pain.  Bone, joint, skin, or soft tissue infection.  How is this diagnosed? You may be screened for GBS between week 35 and week 37 of your pregnancy. If  you have symptoms of preterm labor, you may be screened earlier. This condition is diagnosed based on lab test results from:  A swab of fluid from the vagina and rectum.  A urine sample.  How is this treated? This condition is treated with antibiotic medicine. When you go into labor, or as soon as your water breaks (your membranes rupture), you will be given antibiotics through an IV tube. Antibiotics will continue until after you give birth. If you are having a cesarean delivery, you do not need antibiotics unless your membranes have already ruptured. Follow these instructions at home:  Take over-the-counter and prescription medicines only as told by your health care provider.  Take your antibiotic medicine as told by your health care provider. Do not stop taking the antibiotic even if you start to feel better.  Keep all pre-birth (prenatal) visits and follow-up visits as told by your health care provider. This is important. Contact a health care provider if:  You have pain or burning when you urinate.  You have to urinate frequently.  You have a fever or chills.  You develop a bad-smelling vaginal discharge. Get help right away if:  Your membranes rupture.  You go into labor.  You have severe pain in your abdomen.  You have difficulty breathing.  You have chest pain. This information is not intended to replace advice given to you by your health care provider. Make sure you discuss any questions you have with your health care provider. Document Released: 09/22/2007 Document Revised: 01/10/2016 Document Reviewed: 01/09/2016 Elsevier Interactive Patient Education  2018 [employer]  

## 2017-04-15 NOTE — Progress Notes (Signed)
   PRENATAL VISIT NOTE  Subjective:  Marisa Stephens is a 26 y.o. [phone] at 1236w3d being seen today for ongoing prenatal care.  She is currently monitored for the following issues for this high-risk pregnancy and has Supervision of high risk pregnancy, antepartum; History of premature delivery, currently pregnant; Short cervix during pregnancy in third trimester; Unwanted fertility; and Group B Streptococcus carrier, +RV culture, currently pregnant on her problem list.  Patient reports no complaints.  Contractions: Irregular. Vag. Bleeding: None.  Movement: Present. Denies leaking of fluid.   The following portions of the patient's history were reviewed and updated as appropriate: allergies, current medications, past family history, past medical history, past social history, past surgical history and problem list. Problem list updated.  Objective:   Vitals:   04/15/17 0955  BP: 115/74  Pulse: 83  Weight: 158 lb (71.7 kg)    Fetal Status: Fetal Heart Rate (bpm): 150 Fundal Height: 37 cm Movement: Present     General:  Alert, oriented and cooperative. Patient is in no acute distress.  Skin: Skin is warm and dry. No rash noted.   Cardiovascular: Normal heart rate noted  Respiratory: Normal respiratory effort, no problems with respiration noted  Abdomen: Soft, gravid, appropriate for gestational age.  Pain/Pressure: Present     Pelvic: Cervical exam deferred        Extremities: Normal range of motion.  Edema: Trace  Mental Status:  Normal mood and affect. Normal behavior. Normal judgment and thought content.   Assessment and Plan:  Pregnancy: [id number] at 7236w3d  1. History of premature delivery, currently pregnant 2. Short cervix during pregnancy in third trimester She is now term!!! Patient is very happy!! S/p 17 P regimen.  3. Group B Streptococcus carrier, +RV culture, currently pregnant Informed of results, need for intrapartum prophylaxis.  4. Supervision of high risk  pregnancy, antepartum Term labor symptoms and general obstetric precautions including but not limited to vaginal bleeding, contractions, leaking of fluid and fetal movement were reviewed in detail with the patient. Please refer to After Visit Summary for other counseling recommendations.  Return in about 1 week (around 04/22/2017) for OB Visit.   Jaynie CollinsUgonna Timotheus Salm, MD

## 2017-04-22 ENCOUNTER — Ambulatory Visit (INDEPENDENT_AMBULATORY_CARE_PROVIDER_SITE_OTHER): Payer: Medicaid Other | Admitting: Obstetrics and Gynecology

## 2017-04-22 VITALS — BP 127/64 | HR 92 | Wt 158.0 lb

## 2017-04-22 DIAGNOSIS — O09899 Supervision of other high risk pregnancies, unspecified trimester: Secondary | ICD-10-CM

## 2017-04-22 DIAGNOSIS — O99820 Streptococcus B carrier state complicating pregnancy: Secondary | ICD-10-CM

## 2017-04-22 DIAGNOSIS — O0990 Supervision of high risk pregnancy, unspecified, unspecified trimester: Secondary | ICD-10-CM

## 2017-04-22 DIAGNOSIS — O09219 Supervision of pregnancy with history of pre-term labor, unspecified trimester: Secondary | ICD-10-CM

## 2017-04-22 NOTE — Progress Notes (Signed)
   PRENATAL VISIT NOTE  Subjective:  Ernestene MentionMonique S Rinehimer is a 26 y.o. [id number] at 3534w3d being seen today for ongoing prenatal care.  She is currently monitored for the following issues for this high-risk pregnancy and has Supervision of high risk pregnancy, antepartum; History of premature delivery, currently pregnant; Short cervix during pregnancy in third trimester; Unwanted fertility; and Group B Streptococcus carrier, +RV culture, currently pregnant on her problem list.  Patient reports no complaints.  Contractions: Irregular. Vag. Bleeding: None.  Movement: Present. Denies leaking of fluid.   The following portions of the patient's history were reviewed and updated as appropriate: allergies, current medications, past family history, past medical history, past social history, past surgical history and problem list. Problem list updated.  Objective:   Vitals:   04/22/17 1006  BP: 127/64  Pulse: 92  Weight: 158 lb (71.7 kg)    Fetal Status: Fetal Heart Rate (bpm): 156 Fundal Height: 37 cm Movement: Present     General:  Alert, oriented and cooperative. Patient is in no acute distress.  Skin: Skin is warm and dry. No rash noted.   Cardiovascular: Normal heart rate noted  Respiratory: Normal respiratory effort, no problems with respiration noted  Abdomen: Soft, gravid, appropriate for gestational age.  Pain/Pressure: Present     Pelvic: Cervical exam deferred        Extremities: Normal range of motion.  Edema: Trace  Mental Status:  Normal mood and affect. Normal behavior. Normal judgment and thought content.   Assessment and Plan:  Pregnancy: [id number] at 2034w3d  1. Group B Streptococcus carrier, +RV culture, currently pregnant Will provide prophylaxis in labor  2. Supervision of high risk pregnancy, antepartum Patient is doing well without complaints Ready to be delivered  3. History of premature delivery, currently pregnant S/p weekly 17-P  Term labor symptoms and general  obstetric precautions including but not limited to vaginal bleeding, contractions, leaking of fluid and fetal movement were reviewed in detail with the patient. Please refer to After Visit Summary for other counseling recommendations.  Return in about 1 week (around 04/29/2017) for ROB.   Catalina AntiguaPeggy Kynan Peasley, MD

## 2017-04-29 ENCOUNTER — Telehealth (HOSPITAL_COMMUNITY): Payer: Self-pay | Admitting: *Deleted

## 2017-04-29 ENCOUNTER — Ambulatory Visit (INDEPENDENT_AMBULATORY_CARE_PROVIDER_SITE_OTHER): Payer: Medicaid Other | Admitting: Obstetrics and Gynecology

## 2017-04-29 VITALS — BP 117/79 | HR 90 | Wt 156.5 lb

## 2017-04-29 DIAGNOSIS — O09899 Supervision of other high risk pregnancies, unspecified trimester: Secondary | ICD-10-CM

## 2017-04-29 DIAGNOSIS — O99820 Streptococcus B carrier state complicating pregnancy: Secondary | ICD-10-CM

## 2017-04-29 DIAGNOSIS — O09219 Supervision of pregnancy with history of pre-term labor, unspecified trimester: Secondary | ICD-10-CM

## 2017-04-29 DIAGNOSIS — O0990 Supervision of high risk pregnancy, unspecified, unspecified trimester: Secondary | ICD-10-CM

## 2017-04-29 NOTE — Addendum Note (Signed)
Addended by: Marya LandryFOSTER, Izaya Netherton D on: 04/29/2017 11:27 AM   Modules accepted: Orders

## 2017-04-29 NOTE — Progress Notes (Signed)
   PRENATAL VISIT NOTE  Subjective:  Marisa Stephens is a 26 y.o. [id number] at 8519w3d being seen today for ongoing prenatal care.  She is currently monitored for the following issues for this high-risk pregnancy and has Supervision of high risk pregnancy, antepartum; History of premature delivery, currently pregnant; Short cervix during pregnancy in third trimester; Unwanted fertility; and Group B Streptococcus carrier, +RV culture, currently pregnant on her problem list.  Patient reports contractions every other hour and some leg pain into right upper leg. Overall well, just tired.  Contractions: Irregular. Vag. Bleeding: None.  Movement: Present. Denies leaking of fluid.   The following portions of the patient's history were reviewed and updated as appropriate: allergies, current medications, past family history, past medical history, past social history, past surgical history and problem list. Problem list updated.  Objective:   Vitals:   04/29/17 1034  BP: 117/79  Pulse: 90  Weight: 156 lb 8 oz (71 kg)    Fetal Status: Fetal Heart Rate (bpm): 136   Movement: Present     General:  Alert, oriented and cooperative. Patient is in no acute distress.  Skin: Skin is warm and dry. No rash noted.   Cardiovascular: Normal heart rate noted  Respiratory: Normal respiratory effort, no problems with respiration noted  Abdomen: Soft, gravid, appropriate for gestational age.  Pain/Pressure: Present     Pelvic: Cervical exam deferred        Extremities: Normal range of motion.  Edema: Trace  Mental Status:  Normal mood and affect. Normal behavior. Normal judgment and thought content.   Assessment and Plan:  Pregnancy: [id number] at 2619w3d  1. Supervision of high risk pregnancy, antepartum Post dates testing set up today IOL scheduled for 41 weeks  2. History of premature delivery, currently pregnant  3. Group B Streptococcus carrier, +RV culture, currently pregnant ppx in labor  Term labor  symptoms and general obstetric precautions including but not limited to vaginal bleeding, contractions, leaking of fluid and fetal movement were reviewed in detail with the patient. Please refer to After Visit Summary for other counseling recommendations.  Return in about 1 week (around 05/06/2017) for OB visit.   Conan BowensKelly M Montana Fassnacht, MD

## 2017-04-29 NOTE — Telephone Encounter (Signed)
Preadmission screen  

## 2017-04-29 NOTE — Progress Notes (Signed)
Pt states after ctx's she experiences shooting/throbbing pain in R leg lasting a couple mins which makes it difficult for her to walk.

## 2017-04-30 ENCOUNTER — Encounter (HOSPITAL_COMMUNITY): Payer: Self-pay

## 2017-04-30 ENCOUNTER — Other Ambulatory Visit (HOSPITAL_COMMUNITY): Payer: Self-pay | Admitting: *Deleted

## 2017-04-30 ENCOUNTER — Ambulatory Visit (HOSPITAL_COMMUNITY)
Admission: RE | Admit: 2017-04-30 | Discharge: 2017-04-30 | Disposition: A | Discharge status: Home / Self Care | Payer: Medicaid Other | Source: Ambulatory Visit | Attending: Obstetrics and Gynecology | Admitting: Obstetrics and Gynecology

## 2017-04-30 DIAGNOSIS — O09219 Supervision of pregnancy with history of pre-term labor, unspecified trimester: Secondary | ICD-10-CM

## 2017-04-30 DIAGNOSIS — Z3A39 39 weeks gestation of pregnancy: Secondary | ICD-10-CM | POA: Diagnosis not present

## 2017-04-30 DIAGNOSIS — O0990 Supervision of high risk pregnancy, unspecified, unspecified trimester: Secondary | ICD-10-CM | POA: Insufficient documentation

## 2017-04-30 DIAGNOSIS — O99820 Streptococcus B carrier state complicating pregnancy: Secondary | ICD-10-CM

## 2017-05-01 ENCOUNTER — Inpatient Hospital Stay (HOSPITAL_COMMUNITY): Admission: RE | Admit: 2017-05-01 | Payer: Medicaid Other | Source: Ambulatory Visit

## 2017-05-05 ENCOUNTER — Other Ambulatory Visit: Payer: Self-pay | Admitting: Advanced Practice Midwife

## 2017-05-06 ENCOUNTER — Telehealth: Payer: Self-pay | Admitting: Obstetrics

## 2017-05-06 ENCOUNTER — Encounter: Payer: Medicaid Other | Admitting: Obstetrics

## 2017-05-06 NOTE — Telephone Encounter (Signed)
Telephone call to patient, because she did not keep her prenatal visit appointment today.  Her primary number was not accepting incoming calls.  I left message at the emergency contact number for her to call us back first thing in the morning.  Patient needs to be seen this week.

## 2017-05-07 ENCOUNTER — Ambulatory Visit (HOSPITAL_COMMUNITY)
Admission: RE | Admit: 2017-05-07 | Discharge: 2017-05-07 | Disposition: A | Discharge status: Home / Self Care | Payer: Medicaid Other | Source: Ambulatory Visit | Attending: Obstetrics and Gynecology | Admitting: Obstetrics and Gynecology

## 2017-05-07 ENCOUNTER — Other Ambulatory Visit (HOSPITAL_COMMUNITY): Payer: Self-pay | Admitting: Obstetrics and Gynecology

## 2017-05-07 ENCOUNTER — Encounter (HOSPITAL_COMMUNITY): Payer: Self-pay

## 2017-05-07 DIAGNOSIS — O09219 Supervision of pregnancy with history of pre-term labor, unspecified trimester: Secondary | ICD-10-CM | POA: Diagnosis not present

## 2017-05-07 DIAGNOSIS — Z3A40 40 weeks gestation of pregnancy: Secondary | ICD-10-CM

## 2017-05-07 DIAGNOSIS — O99820 Streptococcus B carrier state complicating pregnancy: Secondary | ICD-10-CM

## 2017-05-10 ENCOUNTER — Inpatient Hospital Stay (HOSPITAL_COMMUNITY): Payer: Medicaid Other | Admitting: Anesthesiology

## 2017-05-10 ENCOUNTER — Inpatient Hospital Stay (HOSPITAL_COMMUNITY)
Admission: RE | Admit: 2017-05-10 | Discharge: 2017-05-12 | DRG: 806 | Disposition: A | Discharge status: Home / Self Care | Payer: Medicaid Other | Source: Ambulatory Visit | Attending: Family Medicine | Admitting: Family Medicine

## 2017-05-10 ENCOUNTER — Encounter (HOSPITAL_COMMUNITY): Payer: Self-pay

## 2017-05-10 ENCOUNTER — Encounter (HOSPITAL_COMMUNITY): Payer: Self-pay | Admitting: Certified Registered Nurse Anesthetist

## 2017-05-10 DIAGNOSIS — O99324 Drug use complicating childbirth: Secondary | ICD-10-CM | POA: Diagnosis present

## 2017-05-10 DIAGNOSIS — O99824 Streptococcus B carrier state complicating childbirth: Secondary | ICD-10-CM | POA: Diagnosis present

## 2017-05-10 DIAGNOSIS — F17210 Nicotine dependence, cigarettes, uncomplicated: Secondary | ICD-10-CM | POA: Diagnosis present

## 2017-05-10 DIAGNOSIS — O26873 Cervical shortening, third trimester: Secondary | ICD-10-CM | POA: Diagnosis present

## 2017-05-10 DIAGNOSIS — O480 Post-term pregnancy: Principal | ICD-10-CM | POA: Diagnosis present

## 2017-05-10 DIAGNOSIS — O99334 Smoking (tobacco) complicating childbirth: Secondary | ICD-10-CM | POA: Diagnosis present

## 2017-05-10 DIAGNOSIS — F1290 Cannabis use, unspecified, uncomplicated: Secondary | ICD-10-CM | POA: Diagnosis present

## 2017-05-10 DIAGNOSIS — Z3A41 41 weeks gestation of pregnancy: Secondary | ICD-10-CM

## 2017-05-10 HISTORY — DX: Post-term pregnancy: O48.0

## 2017-05-10 HISTORY — DX: Chronic kidney disease, unspecified: N18.9

## 2017-05-10 HISTORY — DX: Headache, unspecified: R51.9

## 2017-05-10 HISTORY — DX: Headache: R51

## 2017-05-10 LAB — CBC
HEMATOCRIT: 38.2 % (ref 36.0–46.0)
Hemoglobin: 13.1 g/dL (ref 12.0–15.0)
MCH: 32.7 pg (ref 26.0–34.0)
MCHC: 34.3 g/dL (ref 30.0–36.0)
MCV: 95.3 fL (ref 78.0–100.0)
Platelets: 192 10*3/uL (ref 150–400)
RBC: 4.01 MIL/uL (ref 3.87–5.11)
RDW: 13.7 % (ref 11.5–15.5)
WBC: 7.5 10*3/uL (ref 4.0–10.5)

## 2017-05-10 LAB — TYPE AND SCREEN
ABO/RH(D): O POS
Antibody Screen: NEGATIVE

## 2017-05-10 MED ORDER — PHENYLEPHRINE 40 MCG/ML (10ML) SYRINGE FOR IV PUSH (FOR BLOOD PRESSURE SUPPORT)
80.0000 ug | PREFILLED_SYRINGE | INTRAVENOUS | Status: DC | PRN
  Filled 2017-05-10: qty 5

## 2017-05-10 MED ORDER — OXYCODONE-ACETAMINOPHEN 5-325 MG PO TABS: 2.0000 | ORAL_TABLET | ORAL | Status: DC | PRN

## 2017-05-10 MED ORDER — DIPHENHYDRAMINE HCL 50 MG/ML IJ SOLN: 12.5000 mg | INTRAMUSCULAR | Status: DC | PRN

## 2017-05-10 MED ORDER — ACETAMINOPHEN 325 MG PO TABS: 650.0000 mg | ORAL_TABLET | ORAL | Status: DC | PRN

## 2017-05-10 MED ORDER — OXYTOCIN 40 UNITS IN LACTATED RINGERS INFUSION - SIMPLE MED
1.0000 m[IU]/min | INTRAVENOUS | Status: DC
  Administered 2017-05-10: 2 m[IU]/min via INTRAVENOUS
  Filled 2017-05-10: qty 1000

## 2017-05-10 MED ORDER — EPHEDRINE 5 MG/ML INJ
10.0000 mg | INTRAVENOUS | Status: DC | PRN
  Filled 2017-05-10: qty 2

## 2017-05-10 MED ORDER — SOD CITRATE-CITRIC ACID 500-334 MG/5ML PO SOLN: 30.0000 mL | ORAL | Status: DC | PRN

## 2017-05-10 MED ORDER — MISOPROSTOL 50MCG HALF TABLET: 50.0000 ug | ORAL_TABLET | ORAL | Status: DC | PRN

## 2017-05-10 MED ORDER — PHENYLEPHRINE 40 MCG/ML (10ML) SYRINGE FOR IV PUSH (FOR BLOOD PRESSURE SUPPORT): 80.0000 ug | PREFILLED_SYRINGE | INTRAVENOUS | Status: DC | PRN

## 2017-05-10 MED ORDER — SENNOSIDES-DOCUSATE SODIUM 8.6-50 MG PO TABS
2.0000 | ORAL_TABLET | ORAL | Status: DC
  Administered 2017-05-11 (×2): 2 via ORAL
  Filled 2017-05-10 (×2): qty 2

## 2017-05-10 MED ORDER — FENTANYL 2.5 MCG/ML BUPIVACAINE 1/10 % EPIDURAL INFUSION (WH - ANES)
14.0000 mL/h | INTRAMUSCULAR | Status: DC | PRN
  Administered 2017-05-10: 12 mL/h via EPIDURAL
  Filled 2017-05-10: qty 100

## 2017-05-10 MED ORDER — FENTANYL CITRATE (PF) 100 MCG/2ML IJ SOLN: 100.0000 ug | INTRAMUSCULAR | Status: DC | PRN

## 2017-05-10 MED ORDER — LACTATED RINGERS IV SOLN: 500.0000 mL | INTRAVENOUS | Status: DC | PRN

## 2017-05-10 MED ORDER — PHENYLEPHRINE 40 MCG/ML (10ML) SYRINGE FOR IV PUSH (FOR BLOOD PRESSURE SUPPORT)
80.0000 ug | PREFILLED_SYRINGE | INTRAVENOUS | Status: DC | PRN
  Filled 2017-05-10: qty 5
  Filled 2017-05-10: qty 10

## 2017-05-10 MED ORDER — LIDOCAINE HCL (PF) 1 % IJ SOLN
30.0000 mL | INTRAMUSCULAR | Status: DC | PRN
  Filled 2017-05-10: qty 30

## 2017-05-10 MED ORDER — ONDANSETRON HCL 4 MG PO TABS: 4.0000 mg | ORAL_TABLET | ORAL | Status: DC | PRN

## 2017-05-10 MED ORDER — ONDANSETRON HCL 4 MG/2ML IJ SOLN: 4.0000 mg | Freq: Four times a day (QID) | INTRAMUSCULAR | Status: DC | PRN

## 2017-05-10 MED ORDER — LACTATED RINGERS IV SOLN
INTRAVENOUS | Status: DC
  Administered 2017-05-10 (×2): via INTRAVENOUS

## 2017-05-10 MED ORDER — LACTATED RINGERS IV SOLN
500.0000 mL | Freq: Once | INTRAVENOUS | Status: AC
  Administered 2017-05-10: 1000 mL via INTRAVENOUS

## 2017-05-10 MED ORDER — LACTATED RINGERS IV SOLN: 500.0000 mL | Freq: Once | INTRAVENOUS | Status: DC

## 2017-05-10 MED ORDER — OXYTOCIN 40 UNITS IN LACTATED RINGERS INFUSION - SIMPLE MED: 2.5000 [IU]/h | INTRAVENOUS | Status: DC

## 2017-05-10 MED ORDER — PRENATAL MULTIVITAMIN CH
1.0000 | ORAL_TABLET | Freq: Every day | ORAL | Status: DC
  Administered 2017-05-11: 1 via ORAL
  Filled 2017-05-10: qty 1

## 2017-05-10 MED ORDER — BENZOCAINE-MENTHOL 20-0.5 % EX AERO: 1.0000 "application " | INHALATION_SPRAY | CUTANEOUS | Status: DC | PRN

## 2017-05-10 MED ORDER — FLEET ENEMA 7-19 GM/118ML RE ENEM: 1.0000 | ENEMA | RECTAL | Status: DC | PRN

## 2017-05-10 MED ORDER — PENICILLIN G POTASSIUM 5000000 UNITS IJ SOLR
5.0000 10*6.[IU] | Freq: Once | INTRAVENOUS | Status: AC
  Administered 2017-05-10: 5 10*6.[IU] via INTRAVENOUS
  Filled 2017-05-10: qty 5

## 2017-05-10 MED ORDER — SIMETHICONE 80 MG PO CHEW: 80.0000 mg | CHEWABLE_TABLET | ORAL | Status: DC | PRN

## 2017-05-10 MED ORDER — LIDOCAINE HCL (PF) 1 % IJ SOLN: 30.0000 mL | INTRAMUSCULAR | Status: DC | PRN

## 2017-05-10 MED ORDER — OXYCODONE-ACETAMINOPHEN 5-325 MG PO TABS: 1.0000 | ORAL_TABLET | ORAL | Status: DC | PRN

## 2017-05-10 MED ORDER — EPHEDRINE 5 MG/ML INJ: 10.0000 mg | INTRAVENOUS | Status: DC | PRN

## 2017-05-10 MED ORDER — COCONUT OIL OIL: 1.0000 "application " | TOPICAL_OIL | Status: DC | PRN

## 2017-05-10 MED ORDER — TETANUS-DIPHTH-ACELL PERTUSSIS 5-2.5-18.5 LF-MCG/0.5 IM SUSP: 0.5000 mL | Freq: Once | INTRAMUSCULAR | Status: DC

## 2017-05-10 MED ORDER — TERBUTALINE SULFATE 1 MG/ML IJ SOLN
0.2500 mg | Freq: Once | INTRAMUSCULAR | Status: DC | PRN
  Filled 2017-05-10: qty 1

## 2017-05-10 MED ORDER — PENICILLIN G POT IN DEXTROSE 60000 UNIT/ML IV SOLN
3.0000 10*6.[IU] | INTRAVENOUS | Status: DC
  Administered 2017-05-10: 3 10*6.[IU] via INTRAVENOUS
  Filled 2017-05-10 (×4): qty 50

## 2017-05-10 MED ORDER — OXYTOCIN BOLUS FROM INFUSION: 500.0000 mL | Freq: Once | INTRAVENOUS | Status: DC

## 2017-05-10 MED ORDER — DEXTROSE 5 % IV SOLN
5.0000 10*6.[IU] | Freq: Once | INTRAVENOUS | Status: DC
  Filled 2017-05-10: qty 5

## 2017-05-10 MED ORDER — DIBUCAINE 1 % RE OINT: 1.0000 "application " | TOPICAL_OINTMENT | RECTAL | Status: DC | PRN

## 2017-05-10 MED ORDER — DIPHENHYDRAMINE HCL 25 MG PO CAPS: 25.0000 mg | ORAL_CAPSULE | Freq: Four times a day (QID) | ORAL | Status: DC | PRN

## 2017-05-10 MED ORDER — PNEUMOCOCCAL VAC POLYVALENT 25 MCG/0.5ML IJ INJ
0.5000 mL | INJECTION | INTRAMUSCULAR | Status: DC
  Filled 2017-05-10 (×2): qty 0.5

## 2017-05-10 MED ORDER — LIDOCAINE HCL (PF) 1 % IJ SOLN
INTRAMUSCULAR | Status: DC | PRN
  Administered 2017-05-10: 6 mL via EPIDURAL
  Administered 2017-05-10: 4 mL via EPIDURAL

## 2017-05-10 MED ORDER — ONDANSETRON HCL 4 MG/2ML IJ SOLN
4.0000 mg | Freq: Four times a day (QID) | INTRAMUSCULAR | Status: DC | PRN
Start: 2017-05-10 — End: 2017-05-10

## 2017-05-10 MED ORDER — ZOLPIDEM TARTRATE 5 MG PO TABS: 5.0000 mg | ORAL_TABLET | Freq: Every evening | ORAL | Status: DC | PRN

## 2017-05-10 MED ORDER — IBUPROFEN 600 MG PO TABS
600.0000 mg | ORAL_TABLET | Freq: Four times a day (QID) | ORAL | Status: DC
  Administered 2017-05-10 – 2017-05-12 (×7): 600 mg via ORAL
  Filled 2017-05-10 (×7): qty 1

## 2017-05-10 MED ORDER — PENICILLIN G POT IN DEXTROSE 60000 UNIT/ML IV SOLN
3.0000 10*6.[IU] | INTRAVENOUS | Status: DC
  Filled 2017-05-10: qty 50

## 2017-05-10 MED ORDER — WITCH HAZEL-GLYCERIN EX PADS: 1.0000 "application " | MEDICATED_PAD | CUTANEOUS | Status: DC | PRN

## 2017-05-10 MED ORDER — LACTATED RINGERS IV SOLN: INTRAVENOUS | Status: DC

## 2017-05-10 MED ORDER — ONDANSETRON HCL 4 MG/2ML IJ SOLN: 4.0000 mg | INTRAMUSCULAR | Status: DC | PRN

## 2017-05-10 NOTE — H&P (Signed)
Obstetric History and Physical  Marisa Stephens is a 26 y.o. [id number] with IUP at 983w0d presenting for IOL for postdates pregnancy. Pregnancy complicated by h/o preterm delivery and short cervix. She received 17p. Patient states she has been having  none contractions, none vaginal bleeding, intact membranes, with active fetal movement.    Prenatal Course Source of Care: [place] with onset of care at 12 weeks Dating: By LMP --->  Estimated Date of Delivery: 05/03/17 Pregnancy complications or risks: Patient Active Problem List   Diagnosis Date Noted  . Post-dates pregnancy 05/10/2017  . Post term pregnancy at [redacted] weeks gestation 05/10/2017  . Group B Streptococcus carrier, +RV culture, currently pregnant 04/15/2017  . Unwanted fertility 04/08/2017  . Short cervix during pregnancy in third trimester 02/03/2017  . History of premature delivery, currently pregnant 10/19/2016  . Supervision of high risk pregnancy, antepartum 10/16/2016   She plans to breastfeed She desires bilateral tubal ligation for postpartum contraception.   Sono:    @[redacted]w[redacted]d , CWD, normal anatomy, cephalic presentation, posterior placenta, 614g, 54% EFW  Prenatal labs and studies: ABO, Rh: O/Positive/-- (04/23 1617) Antibody: Negative (04/23 1617) Rubella: 4.15 (04/23 1617) RPR: Non Reactive (08/16 0853)  HBsAg: Negative (04/23 1617)  HIV:   Non-Reactive AVW:UJWJXBJYGBS:Positive (10/11 1500) 2 hr Glucola  nml Genetic screening normal Anatomy US normal  Prenatal Transfer Tool  Maternal Diabetes: No Genetic Screening: Normal Maternal Ultrasounds/Referrals: Normal Fetal Ultrasounds or other Referrals:  None Maternal Substance Abuse:  No Significant Maternal Medications:  None Significant Maternal Lab Results: Lab values include: Group B Strep positive  Past Medical History:  Diagnosis Date  . Chronic kidney disease    treated  . Former smoker   . Group beta Strep positive 2016  . Headache    migraines; Tylenol helps  .  Hx of chlamydia infection 2010   treated  . 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 Medical history non-contributory   . No pertinent past medical history     Past Surgical History:  Procedure Laterality Date  . NO PAST SURGERIES      OB History  Gravida Para Term Preterm AB Living  6 4 3 1 1 4   SAB TAB Ectopic Multiple Live Births  1 0 0 0 4    # Outcome Date GA Lbr Len/2nd Weight Sex Delivery Anes PTL Lv  6 Current           5 Term 01/29/15 6335w2d 01:38 / 00:06 2.821 kg (6 lb 3.5 oz) F Vag-Spont None  LIV  4 Preterm 12/19/13 595w4d 04:29 / 00:06 2.07 kg (4 lb 9 oz) M Vag-Spont None  LIV     Birth Comments: premature  3 Term 12/06/12 7358w5d 08:21 / 00:16 2.96 kg (6 lb 8.4 oz) F Vag-Spont EPI  LIV     Birth Comments: WNL  2 SAB 2012          1 Term 2008    F Vag-Spont   LIV      Social History   Socioeconomic History  . Marital status: Single    Spouse name: None  . Number of children: None  . Years of education: None  . Highest education level: None  Social Needs  . Financial resource strain: None  . Food insecurity - worry: None  . Food insecurity - inability: None  . Transportation needs - medical: None  . Transportation needs - non-medical: None  Occupational History  . None  Tobacco Use  .  Smoking status: Current Some Day Smoker    Packs/day: 0.25    Years: 5.00    Pack years: 1.25    Types: Cigarettes  . Smokeless tobacco: Never Used  Substance and Sexual Activity  . Alcohol use: No  . Drug use: Yes    Types: Marijuana    Comment: not using at this moment  . Sexual activity: Yes    Birth control/protection: None    Comment: currently pregnant  Other Topics Concern  . None  Social History Narrative  . None    Family History  Problem Relation Age of Onset  . Diabetes Maternal Grandmother     Facility-Administered Medications Prior to Admission  Medication Dose Route Frequency Provider Last Rate Last Dose  . hydroxyprogesterone caproate (MAKENA)  250 mg/mL injection 250 mg  250 mg Intramuscular Weekly Brock BadHarper, Charles A, MD   250 mg at 04/08/17 1452   Medications Prior to Admission  Medication Sig Dispense Refill Last Dose  . acetaminophen (TYLENOL) 325 MG tablet Take 650 mg by mouth every 6 (six) hours as needed for mild pain.   Taking  . clotrimazole (LOTRIMIN) 1 % cream Apply 1 application topically 2 (two) times daily. (Patient not taking: Reported on 04/29/2017) 113 g 1 Not Taking  . prenatal vitamin w/FE, FA (PRENATAL 1 + 1) 27-1 MG TABS tablet Take 1 tablet by mouth daily at 12 noon. 30 each 3 Taking    No Known Allergies  Review of Systems: Negative except for what is mentioned in HPI.  Physical Exam: BP 131/79 (BP Location: Right Arm)   Pulse 81   Temp 97.6 F (36.4 C) (Oral)   Resp 20   Ht 5\' 1"  (1.549 m)   Wt 73.4 kg (161 lb 12.8 oz)   LMP 07/05/2016 (Approximate)   BMI 30.57 kg/m  CONSTITUTIONAL: Well-developed, well-nourished female in no acute distress.  HENT:  Normocephalic, atraumatic, External right and left ear normal. Oropharynx is clear and moist EYES: Conjunctivae and EOM are normal. Pupils are equal, round, and reactive to light. No scleral icterus.  NECK: Normal range of motion, supple, no masses SKIN: Skin is warm and dry. No rash noted. Not diaphoretic. No erythema. No pallor. NEUROLOGIC: Alert and oriented to person, place, and time. Normal reflexes, muscle tone coordination. No cranial nerve deficit noted. PSYCHIATRIC: Normal mood and affect. Normal behavior. Normal judgment and thought content. CARDIOVASCULAR: Normal heart rate noted, regular rhythm RESPIRATORY: Effort and breath sounds normal, no problems with respiration noted ABDOMEN: Soft, nontender, nondistended, gravid. MUSCULOSKELETAL: Normal range of motion. No edema and no tenderness. 2+ distal pulses.  Cervical Exam: Dilation: 5 Effacement (%): 70 Station: -3 Presentation: Vertex Exam by:: Dr. Doroteo GlassmanPhelps  Presentation: cephalic FHT:   Baseline rate 161130 bpm   Variability moderate  Accelerations present   Decelerations none Contractions: Irregular   Pertinent Labs/Studies:   No results found for this or any previous visit (from the past 24 hour(s)).  Assessment : Marisa Stephens is a 26 y.o. [id number] at 6233w0d being admitted for induction of labor due to postdates.  Plan: Labor: Induction as ordered as per protocol. Will start with Pitocin. AROM later when GBS prophylaxis appropriate.  Analgesia as needed.  FWB: Reassuring fetal heart tracing.   GBS positive - PCN Delivery plan: Hopeful for vaginal delivery   Caryl AdaJazma Phelps, DO OB Fellow Faculty Practice, [facility] [place] 05/10/2017, 9:32 AM

## 2017-05-10 NOTE — Anesthesia Postprocedure Evaluation (Signed)
Anesthesia Post Note  Patient: Marisa Stephens  Procedure(s) Performed: AN AD HOC LABOR EPIDURAL     Patient location during evaluation: Mother Baby Anesthesia Type: Epidural Level of consciousness: awake, awake and alert, oriented and patient cooperative Pain management: pain level controlled Vital Signs Assessment: post-procedure vital signs reviewed and stable Respiratory status: spontaneous breathing, nonlabored ventilation and respiratory function stable Cardiovascular status: stable Postop Assessment: no headache, no backache, patient able to bend at knees and no apparent nausea or vomiting Anesthetic complications: no    Last Vitals:  Vitals:   05/10/17 1715 05/10/17 1816  BP: 123/67 120/65  Pulse: 75 77  Resp: 18 18  Temp: 36.8 C 36.9 C  SpO2: 100%     Last Pain:  Vitals:   05/10/17 1816  TempSrc: Oral  PainSc:    Pain Goal: Patients Stated Pain Goal: 6 (05/10/17 0914)               Juan Kissoon L

## 2017-05-10 NOTE — Anesthesia Pain Management Evaluation Note (Signed)
  CRNA Pain Management Visit Note  Patient: Ernestene MentionMonique S Stuck, 26 y.o., female  "Hello I am a [facility] of the anesthesia [facility]. We have an anesthesia team available at all times to provide care throughout the hospital, including epidural management and anesthesia for C-section. I don't know your plan for the delivery whether it a natural birth, water birth, IV sedation, nitrous supplementation, doula or epidural, but we want to meet your pain goals."   1.Was your pain managed to your expectations on prior hospitalizations?   Yes   2.What is your expectation for pain management during this hospitalization?     Labor support without medications, Epidural, IV pain meds and Nitrous Oxide  3.How can we help you reach that goal? Patient is aware of all pain control options and will determine plan as labor progresses.  Record the patient's initial score and the patient's pain goal.   Pain: 0  Pain Goal: 5 [facility] wants you to be able to say your pain was always managed very well.  Tyshana Nishida L 05/10/2017

## 2017-05-10 NOTE — Anesthesia Procedure Notes (Signed)
Epidural Patient location during procedure: OB Start time: 05/10/2017 12:50 PM End time: 05/10/2017 1:06 PM  Staffing Anesthesiologist: Jairo BenJackson, Shaughnessy Gethers, MD Performed: anesthesiologist   Preanesthetic Checklist Completed: patient identified, surgical consent, pre-op evaluation, timeout performed, IV checked, risks and benefits discussed and monitors and equipment checked  Epidural Patient position: sitting Prep: site prepped and draped and DuraPrep Patient monitoring: blood pressure, continuous pulse ox and heart rate Approach: midline Location: L3-L4 Injection technique: LOR air  Needle:  Needle type: Tuohy  Needle gauge: 17 G Needle length: 9 cm Needle insertion depth: 4.5 cm Catheter type: closed end flexible Catheter size: 19 Gauge Catheter at skin depth: 9.5 cm Test dose: negative (1% Lidocaine)  Assessment Events: blood not aspirated, injection not painful, no injection resistance, negative IV test and no paresthesia  Additional Notes Pt identified in Labor room.  Monitors applied. Working IV access confirmed. Sterile prep, drape lumbar spine.  1% lido local L 3,4.  #17ga Touhy LOR air at 4.5 cm L 3,4, cath in easily to 9.5 cm skin. Test dose OK, cath dosed and infusion begun.  Patient asymptomatic, VSS, no heme aspirated, tolerated well.  Sandford Craze Claudy Abdallah, MD

## 2017-05-10 NOTE — Anesthesia Preprocedure Evaluation (Addendum)
Anesthesia Evaluation  Patient identified by MRN, date of birth, ID band Patient awake    Reviewed: Allergy & Precautions, NPO status , Patient's Chart, lab work & pertinent test results  History of Anesthesia Complications Negative for: history of anesthetic complications  Airway Mallampati: III  TM Distance: >3 FB Neck ROM: Full    Dental  (+) Dental Advisory Given   Pulmonary Current Smoker,    breath sounds clear to auscultation       Cardiovascular negative cardio ROS   Rhythm:Regular Rate:Normal     Neuro/Psych  Headaches,    GI/Hepatic GERD  ,(+)     substance abuse  marijuana use,   Endo/Other  negative endocrine ROS  Renal/GU negative Renal ROS     Musculoskeletal   Abdominal   Peds  Hematology plt 192k   Anesthesia Other Findings   Reproductive/Obstetrics (+) Pregnancy                            Anesthesia Physical Anesthesia Plan  ASA: II  Anesthesia Plan: Epidural   Post-op Pain Management:    Induction:   PONV Risk Score and Plan: Treatment may vary due to age or medical condition  Airway Management Planned: Natural Airway  Additional Equipment:   Intra-op Plan:   Post-operative Plan:   Informed Consent: I have reviewed the patients History and Physical, chart, labs and discussed the procedure including the risks, benefits and alternatives for the proposed anesthesia with the patient or authorized representative who has indicated his/her understanding and acceptance.   Dental advisory given  Plan Discussed with:   Anesthesia Plan Comments: (Patient identified. Risks/Benefits/Options discussed with patient including but not limited to bleeding, infection, nerve damage, paralysis, failed block, incomplete pain control, headache, blood pressure changes, nausea, vomiting, reactions to medication both or allergic, itching and postpartum back pain. Confirmed with  bedside nurse the patient's most recent platelet count. Confirmed with patient that they are not currently taking any anticoagulation, have any bleeding history or any family history of bleeding disorders. Patient expressed understanding and wished to proceed. All questions were answered. )        Anesthesia Quick Evaluation

## 2017-05-11 LAB — RPR: RPR Ser Ql: NONREACTIVE

## 2017-05-11 NOTE — Progress Notes (Signed)
Post Partum Day 1 Subjective: up ad lib, voiding, tolerating PO and + flatus. She reports moderate cramping abdominal pain that she rates as a 5/10 that is controlled with Motrin.  Objective: Blood pressure 117/64, pulse 72, temperature 98.2 F (36.8 C), temperature source Oral, resp. rate 16, height 5\' 1"  (1.549 m), weight 73.4 kg (161 lb 12.8 oz), last menstrual period 07/05/2016, SpO2 98 %, unknown if currently breastfeeding.  Physical Exam:  General: alert and cooperative Lochia: appropriate Uterine Fundus: firm Incision: N/A DVT Evaluation: No evidence of DVT seen on physical exam.  Recent Labs    05/10/17 1040  HGB 13.1  HCT 38.2    Assessment/Plan: Plan for discharge tomorrow, breast and bottle feeding, plan for IUD for contraception.   LOS: 1 day   Julieanne MansonRebecca Haug, PA-S 05/11/2017, 7:48 AM   OB FELLOW MEDICAL STUDENT NOTE ATTESTATION  I confirm that I have verified the information documented in the medical student's note and that I have also personally performed the physical exam and all medical decision making activities.   Frederik PearJulie P Degele, MD OB Fellow

## 2017-05-11 NOTE — Plan of Care (Signed)
Tolerating activity, pain controlled. Breastfeeding and bonding well with newborn.

## 2017-05-12 MED ORDER — IBUPROFEN 600 MG PO TABS: 600.0000 mg | ORAL_TABLET | Freq: Four times a day (QID) | ORAL | 0 refills | Status: DC

## 2017-05-12 MED ORDER — SENNOSIDES-DOCUSATE SODIUM 8.6-50 MG PO TABS: 2.0000 | ORAL_TABLET | ORAL | 0 refills | Status: DC

## 2017-05-12 NOTE — Discharge Instructions (Signed)
° °Vaginal Delivery, Care After °Refer to this sheet in the next few weeks. These instructions provide you with information about caring for yourself after vaginal delivery. Your health care provider may also give you more specific instructions. Your treatment has been planned according to current medical practices, but problems sometimes occur. Call your health care provider if you have any problems or questions. °What can I expect after the procedure? °After vaginal delivery, it is common to have: °· Some bleeding from your vagina. °· Soreness in your abdomen, your vagina, and the area of skin between your vaginal opening and your anus (perineum). °· Pelvic cramps. °· Fatigue. ° °Follow these instructions at home: °Medicines °· Take over-the-counter and prescription medicines only as told by your health care provider. °· If you were prescribed an antibiotic medicine, take it as told by your health care provider. Do not stop taking the antibiotic until it is finished. °Driving ° °· Do not drive or operate heavy machinery while taking prescription pain medicine. °· Do not drive for 24 hours if you received a sedative. °Lifestyle °· Do not drink alcohol. This is especially important if you are breastfeeding or taking medicine to relieve pain. °· Do not use tobacco products, including cigarettes, chewing tobacco, or e-cigarettes. If you need help quitting, ask your health care provider. °Eating and drinking °· Drink at least 8 eight-ounce glasses of water every day unless you are told not to by your health care provider. If you choose to breastfeed your baby, you may need to drink more water than this. °· Eat high-fiber foods every day. These foods may help prevent or relieve constipation. High-fiber foods include: °? Whole grain cereals and breads. °? Brown rice. °? Beans. °? Fresh fruits and vegetables. °Activity °· Return to your normal activities as told by your health care provider. Ask your health care provider  what activities are safe for you. °· Rest as much as possible. Try to rest or take a nap when your baby is sleeping. °· Do not lift anything that is heavier than your baby or 10 lb (4.5 kg) until your health care provider says that it is safe. °· Talk with your health care provider about when you can engage in sexual activity. This may depend on your: °? Risk of infection. °? Rate of healing. °? Comfort and desire to engage in sexual activity. °Vaginal Care °· If you have an episiotomy or a vaginal tear, check the area every day for signs of infection. Check for: °? More redness, swelling, or pain. °? More fluid or blood. °? Warmth. °? Pus or a bad smell. °· Do not use tampons or douches until your health care provider says this is safe. °· Watch for any blood clots that may pass from your vagina. These may look like clumps of dark red, brown, or black discharge. °General instructions °· Keep your perineum clean and dry as told by your health care provider. °· Wear loose, comfortable clothing. °· Wipe from front to back when you use the toilet. °· Ask your health care provider if you can shower or take a bath. If you had an episiotomy or a perineal tear during labor and delivery, your health care provider may tell you not to take baths for a certain length of time. °· Wear a bra that supports your breasts and fits you well. °· If possible, have someone help you with household activities and help care for your baby for at least a few days   after you leave the hospital. °· Keep all follow-up visits for you and your baby as told by your health care provider. This is important. °Contact a health care provider if: °· You have: °? Vaginal discharge that has a bad smell. °? Difficulty urinating. °? Pain when urinating. °? A sudden increase or decrease in the frequency of your bowel movements. °? More redness, swelling, or pain around your episiotomy or vaginal tear. °? More fluid or blood coming from your episiotomy or  vaginal tear. °? Pus or a bad smell coming from your episiotomy or vaginal tear. °? A fever. °? A rash. °? Little or no interest in activities you used to enjoy. °? Questions about caring for yourself or your baby. °· Your episiotomy or vaginal tear feels warm to the touch. °· Your episiotomy or vaginal tear is separating or does not appear to be healing. °· Your breasts are painful, hard, or turn red. °· You feel unusually sad or worried. °· You feel nauseous or you vomit. °· You pass large blood clots from your vagina. If you pass a blood clot from your vagina, save it to show to your health care provider. Do not flush blood clots down the toilet without having your health care provider look at them. °· You urinate more than usual. °· You are dizzy or light-headed. °· You have not breastfed at all and you have not had a menstrual period for 12 weeks after delivery. °· You have stopped breastfeeding and you have not had a menstrual period for 12 weeks after you stopped breastfeeding. °Get help right away if: °· You have: °? Pain that does not go away or does not get better with medicine. °? Chest pain. °? Difficulty breathing. °? Blurred vision or spots in your vision. °? Thoughts about hurting yourself or your baby. °· You develop pain in your abdomen or in one of your legs. °· You develop a severe headache. °· You faint. °· You bleed from your vagina so much that you fill two sanitary pads in one hour. °This information is not intended to replace advice given to you by your health care provider. Make sure you discuss any questions you have with your health care provider. °Document Released: 06/12/2000 Document Revised: 11/27/2015 Document Reviewed: 06/30/2015 °[employer] Interactive Patient Education © 2017 [employer] °Breastfeeding °Deciding to breastfeed is one of the best choices you can make for you and your baby. A change in hormones during pregnancy causes your breast tissue to grow and increases the  number and size of your milk ducts. These hormones also allow proteins, sugars, and fats from your blood supply to make breast milk in your milk-producing glands. Hormones prevent breast milk from being released before your baby is born as well as prompt milk flow after birth. Once breastfeeding has begun, thoughts of your baby, as well as his or her sucking or crying, can stimulate the release of milk from your milk-producing glands. °Benefits of breastfeeding °For Your Baby °· Your first milk (colostrum) helps your baby's digestive system function better. °· There are antibodies in your milk that help your baby fight off infections. °· Your baby has a lower incidence of asthma, allergies, and sudden infant death syndrome. °· The nutrients in breast milk are better for your baby than infant formulas and are designed uniquely for your baby’s needs. °· Breast milk improves your baby's brain development. °· Your baby is less likely to develop other conditions, such as childhood obesity, asthma,   or type 2 diabetes mellitus. ° °For You °· Breastfeeding helps to create a very special bond between you and your baby. °· Breastfeeding is convenient. Breast milk is always available at the correct temperature and costs nothing. °· Breastfeeding helps to burn calories and helps you lose the weight gained during pregnancy. °· Breastfeeding makes your uterus contract to its prepregnancy size faster and slows bleeding (lochia) after you give birth. °· Breastfeeding helps to lower your risk of developing type 2 diabetes mellitus, osteoporosis, and breast or ovarian cancer later in life. ° °Signs that your baby is hungry °Early Signs of Hunger °· Increased alertness or activity. °· Stretching. °· Movement of the head from side to side. °· Movement of the head and opening of the mouth when the corner of the mouth or cheek is stroked (rooting). °· Increased sucking sounds, smacking lips, cooing, sighing, or  squeaking. °· Hand-to-mouth movements. °· Increased sucking of fingers or hands. ° °Late Signs of Hunger °· Fussing. °· Intermittent crying. ° °Extreme Signs of Hunger °Signs of extreme hunger will require calming and consoling before your baby will be able to breastfeed successfully. Do not wait for the following signs of extreme hunger to occur before you initiate breastfeeding: °· Restlessness. °· A loud, strong cry. °· Screaming. ° °Breastfeeding basics °Breastfeeding Initiation °· Find a comfortable place to sit or lie down, with your neck and back well supported. °· Place a pillow or rolled up blanket under your baby to bring him or her to the level of your breast (if you are seated). Nursing pillows are specially designed to help support your arms and your baby while you breastfeed. °· Make sure that your baby's abdomen is facing your abdomen. °· Gently massage your breast. With your fingertips, massage from your chest wall toward your nipple in a circular motion. This encourages milk flow. You may need to continue this action during the feeding if your milk flows slowly. °· Support your breast with 4 fingers underneath and your thumb above your nipple. Make sure your fingers are well away from your nipple and your baby’s mouth. °· Stroke your baby's lips gently with your finger or nipple. °· When your baby's mouth is open wide enough, quickly bring your baby to your breast, placing your entire nipple and as much of the colored area around your nipple (areola) as possible into your baby's mouth. °? More areola should be visible above your baby's upper lip than below the lower lip. °? Your baby's tongue should be between his or her lower gum and your breast. °· Ensure that your baby's mouth is correctly positioned around your nipple (latched). Your baby's lips should create a seal on your breast and be turned out (everted). °· It is common for your baby to suck about 2-3 minutes in order to start the flow of  breast milk. ° °Latching °Teaching your baby how to latch on to your breast properly is very important. An improper latch can cause nipple pain and decreased milk supply for you and poor weight gain in your baby. Also, if your baby is not latched onto your nipple properly, he or she may swallow some air during feeding. This can make your baby fussy. Burping your baby when you switch breasts during the feeding can help to get rid of the air. However, teaching your baby to latch on properly is still the best way to prevent fussiness from swallowing air while breastfeeding. °Signs that your baby has successfully latched   on to your nipple: °· Silent tugging or silent sucking, without causing you pain. °· Swallowing heard between every 3-4 sucks. °· Muscle movement above and in front of his or her ears while sucking. ° °Signs that your baby has not successfully latched on to nipple: °· Sucking sounds or smacking sounds from your baby while breastfeeding. °· Nipple pain. ° °If you think your baby has not latched on correctly, slip your finger into the corner of your baby’s mouth to break the suction and place it between your baby's gums. Attempt breastfeeding initiation again. °Signs of Successful Breastfeeding °Signs from your baby: °· A gradual decrease in the number of sucks or complete cessation of sucking. °· Falling asleep. °· Relaxation of his or her body. °· Retention of a small amount of milk in his or her mouth. °· Letting go of your breast by himself or herself. ° °Signs from you: °· Breasts that have increased in firmness, weight, and size 1-3 hours after feeding. °· Breasts that are softer immediately after breastfeeding. °· Increased milk volume, as well as a change in milk consistency and color by the fifth day of breastfeeding. °· Nipples that are not sore, cracked, or bleeding. ° °Signs That Your Baby is Getting Enough Milk °· Wetting at least 1-2 diapers during the first 24 hours after birth. °· Wetting  at least 5-6 diapers every 24 hours for the first week after birth. The urine should be clear or pale yellow by 5 days after birth. °· Wetting 6-8 diapers every 24 hours as your baby continues to grow and develop. °· At least 3 stools in a 24-hour period by age 5 days. The stool should be soft and yellow. °· At least 3 stools in a 24-hour period by age 7 days. The stool should be seedy and yellow. °· No loss of weight greater than 10% of birth weight during the first 3 days of age. °· Average weight gain of 4-7 ounces (113-198 g) per week after age 4 days. °· Consistent daily weight gain by age 5 days, without weight loss after the age of 2 weeks. ° °After a feeding, your baby may spit up a small amount. This is common. °Breastfeeding frequency and duration °Frequent feeding will help you make more milk and can prevent sore nipples and breast engorgement. Breastfeed when you feel the need to reduce the fullness of your breasts or when your baby shows signs of hunger. This is called "breastfeeding on demand." Avoid introducing a pacifier to your baby while you are working to establish breastfeeding (the first 4-6 weeks after your baby is born). After this time you may choose to use a pacifier. Research has shown that pacifier use during the first year of a baby's life decreases the risk of sudden infant death syndrome (SIDS). °Allow your baby to feed on each breast as long as he or she wants. Breastfeed until your baby is finished feeding. When your baby unlatches or falls asleep while feeding from the first breast, offer the second breast. Because newborns are often sleepy in the first few weeks of life, you may need to awaken your baby to get him or her to feed. °Breastfeeding times will vary from baby to baby. However, the following rules can serve as a guide to help you ensure that your baby is properly fed: °· Newborns (babies 4 weeks of age or younger) may breastfeed every 1-3 hours. °· Newborns should not go  longer than 3 hours during the   day or 5 hours during the night without breastfeeding. °· You should breastfeed your baby a minimum of 8 times in a 24-hour period until you begin to introduce solid foods to your baby at around 6 months of age. ° °Breast milk pumping °Pumping and storing breast milk allows you to ensure that your baby is exclusively fed your breast milk, even at times when you are unable to breastfeed. This is especially important if you are going back to work while you are still breastfeeding or when you are not able to be present during feedings. Your lactation consultant can give you guidelines on how long it is safe to store breast milk. °A breast pump is a machine that allows you to pump milk from your breast into a sterile bottle. The pumped breast milk can then be stored in a refrigerator or freezer. Some breast pumps are operated by hand, while others use electricity. Ask your lactation consultant which type will work best for you. Breast pumps can be purchased, but some hospitals and breastfeeding support groups lease breast pumps on a monthly basis. A lactation consultant can teach you how to hand express breast milk, if you prefer not to use a pump. °Caring for your breasts while you breastfeed °Nipples can become dry, cracked, and sore while breastfeeding. The following recommendations can help keep your breasts moisturized and healthy: °· Avoid using soap on your nipples. °· Wear a supportive bra. Although not required, special nursing bras and tank tops are designed to allow access to your breasts for breastfeeding without taking off your entire bra or top. Avoid wearing underwire-style bras or extremely tight bras. °· Air dry your nipples for 3-4 minutes after each feeding. °· Use only cotton bra pads to absorb leaked breast milk. Leaking of breast milk between feedings is normal. °· Use lanolin on your nipples after breastfeeding. Lanolin helps to maintain your skin's normal moisture  barrier. If you use pure lanolin, you do not need to wash it off before feeding your baby again. Pure lanolin is not toxic to your baby. You may also hand express a few drops of breast milk and gently massage that milk into your nipples and allow the milk to air dry. ° °In the first few weeks after giving birth, some women experience extremely full breasts (engorgement). Engorgement can make your breasts feel heavy, warm, and tender to the touch. Engorgement peaks within 3-5 days after you give birth. The following recommendations can help ease engorgement: °· Completely empty your breasts while breastfeeding or pumping. You may want to start by applying warm, moist heat (in the shower or with warm water-soaked hand towels) just before feeding or pumping. This increases circulation and helps the milk flow. If your baby does not completely empty your breasts while breastfeeding, pump any extra milk after he or she is finished. °· Wear a snug bra (nursing or regular) or tank top for 1-2 days to signal your body to slightly decrease milk production. °· Apply ice packs to your breasts, unless this is too uncomfortable for you. °· Make sure that your baby is latched on and positioned properly while breastfeeding. ° °If engorgement persists after 48 hours of following these recommendations, contact your health care provider or a lactation consultant. °Overall health care recommendations while breastfeeding °· Eat healthy foods. Alternate between meals and snacks, eating 3 of each per day. Because what you eat affects your breast milk, some of the foods may make your baby more irritable than usual.   Avoid eating these foods if you are sure that they are negatively affecting your baby. °· Drink milk, fruit juice, and water to satisfy your thirst (about 10 glasses a day). °· Rest often, relax, and continue to take your prenatal vitamins to prevent fatigue, stress, and anemia. °· Continue breast self-awareness checks. °· Avoid  chewing and smoking tobacco. Chemicals from cigarettes that pass into breast milk and exposure to secondhand smoke may harm your baby. °· Avoid alcohol and drug use, including marijuana. °Some medicines that may be harmful to your baby can pass through breast milk. It is important to ask your health care provider before taking any medicine, including all over-the-counter and prescription medicine as well as vitamin and herbal supplements. °It is possible to become pregnant while breastfeeding. If birth control is desired, ask your health care provider about options that will be safe for your baby. °Contact a health care provider if: °· You feel like you want to stop breastfeeding or have become frustrated with breastfeeding. °· You have painful breasts or nipples. °· Your nipples are cracked or bleeding. °· Your breasts are red, tender, or warm. °· You have a swollen area on either breast. °· You have a fever or chills. °· You have nausea or vomiting. °· You have drainage other than breast milk from your nipples. °· Your breasts do not become full before feedings by the fifth day after you give birth. °· You feel sad and depressed. °· Your baby is too sleepy to eat well. °· Your baby is having trouble sleeping. °· Your baby is wetting less than 3 diapers in a 24-hour period. °· Your baby has less than 3 stools in a 24-hour period. °· Your baby's skin or the white part of his or her eyes becomes yellow. °· Your baby is not gaining weight by 5 days of age. °Get help right away if: °· Your baby is overly tired (lethargic) and does not want to wake up and feed. °· Your baby develops an unexplained fever. °This information is not intended to replace advice given to you by your health care provider. Make sure you discuss any questions you have with your health care provider. °Document Released: 06/15/2005 Document Revised: 11/27/2015 Document Reviewed: 12/07/2012 °[employer] Interactive Patient Education © 2017 [employer] °Home Care Instructions for Mom °ACTIVITY °· Gradually return to your regular activities. °· Let yourself rest. Nap while your baby sleeps. °· Avoid lifting anything that is heavier than 10 lb (4.5 kg) until your health care provider says it is okay. °· Avoid activities that take a lot of effort and energy (are strenuous) until approved by your health care provider. Walking at a slow-to-moderate pace is usually safe. °· If you had a cesarean delivery: °? Do not vacuum, climb stairs, or drive a car for 4-6 weeks. °? Have someone help you at home until you feel like you can do your usual activities yourself. °? Do exercises as told by your health care provider, if this applies. ° °VAGINAL BLEEDING °You may continue to bleed for 4-6 weeks after delivery. Over time, the amount of blood usually decreases and the color of the blood usually gets lighter. However, the flow of bright red blood may increase if you have been too active. If you need to use more than one pad in an hour because your pad gets soaked, or if you pass a large clot: °· Lie down. °· Raise your feet. °· Place a cold compress on your lower   abdomen. °· Rest. °· Call your health care provider. ° °If you are breastfeeding, your period should return anytime between 8 weeks after delivery and the time that you stop breastfeeding. If you are not breastfeeding, your period should return 6-8 weeks after delivery. °PERINEAL CARE °The perineal area, or perineum, is the part of your body between your thighs. After delivery, this area needs special care. Follow these instructions as told by your health care provider. °· Take warm tub baths for 15-20 minutes. °· Use medicated pads and pain-relieving sprays and creams as told. °· Do not use tampons or douches until vaginal bleeding has stopped. °· Each time you go to the bathroom: °? Use a peri bottle. °? Change your pad. °? Use towelettes in place of toilet paper until your stitches have healed. °· Do Kegel  exercises every day. Kegel exercises help to maintain the muscles that support the vagina, bladder, and bowels. You can do these exercises while you are standing, sitting, or lying down. To do Kegel exercises: °? Tighten the muscles of your abdomen and the muscles that surround your birth canal. °? Hold for a few seconds. °? Relax. °? Repeat until you have done this 5 times in a row. °· To prevent hemorrhoids from developing or getting worse: °? Drink enough fluid to keep your urine clear or pale yellow. °? Avoid straining when having a bowel movement. °? Take over-the-counter medicines and stool softeners as told by your health care provider. ° °BREAST CARE °· Wear a tight-fitting bra. °· Avoid taking over-the-counter pain medicine for breast discomfort. °· Apply ice to the breasts to help with discomfort as needed: °? Put ice in a plastic bag. °? Place a towel between your skin and the bag. °? Leave the ice on for 20 minutes or as told by your health care provider. ° °NUTRITION °· Eat a well-balanced diet. °· Do not try to lose weight quickly by cutting back on calories. °· Take your prenatal vitamins until your postpartum checkup or until your health care provider tells you to stop. ° °POSTPARTUM DEPRESSION °You may find yourself crying for no apparent reason and unable to cope with all of the changes that come with having a newborn. This mood is called postpartum depression. Postpartum depression happens because your hormone levels change after delivery. If you have postpartum depression, get support from your partner, friends, and family. If the depression does not go away on its own after several weeks, contact your health care provider. °BREAST SELF-EXAM °Do a breast self-exam each month, at the same time of the month. If you are breastfeeding, check your breasts just after a feeding, when your breasts are less full. If you are breastfeeding and your period has started, check your breasts on day 5, 6, or 7 of  your period. °Report any lumps, bumps, or discharge to your health care provider. Know that breasts are normally lumpy if you are breastfeeding. This is temporary, and it is not a health risk. °INTIMACY AND SEXUALITY °Avoid sexual activity for at least 3-4 weeks after delivery or until the brownish-red vaginal flow is completely gone. If you want to avoid pregnancy, use some form of birth control. You can get pregnant after delivery, even if you have not had your period. °SEEK MEDICAL CARE IF: °· You feel unable to cope with the changes that a child brings to your life, and these feelings do not go away after several weeks. °· You notice a lump, a bump, or   discharge on your breast. ° °SEEK IMMEDIATE MEDICAL CARE IF: °· Blood soaks your pad in 1 hour or less. °· You have: °? Severe pain or cramping in your lower abdomen. °? A bad-smelling vaginal discharge. °? A fever that is not controlled by medicine. °? A fever, and an area of your breast is red and sore. °? Pain or redness in your calf. °? Sudden, severe chest pain. °? Shortness of breath. °? Painful or bloody urination. °? Problems with your vision. °· You vomit for 12 hours or longer. °· You develop a severe headache. °· You have serious thoughts about hurting yourself, your child, or anyone else. ° °This information is not intended to replace advice given to you by your health care provider. Make sure you discuss any questions you have with your health care provider. °Document Released: 06/12/2000 Document Revised: 11/21/2015 Document Reviewed: 12/17/2014 °Elsevier Interactive Patient Education © 2017 [employer] ° °

## 2017-05-12 NOTE — Lactation Note (Signed)
This note was copied from a baby's chart. Lactation Consultation Note  Patient Name: [name] GNFAO'ZToday's Date: 05/12/2017 Reason for consult: Initial assessment;Term Breastfeeding consultation services and support information given and reviewed.  This is mom's fifth baby and newborn is 7541 hours old.  Mom reports she breastfed previous babies without challenges.  Baby is currently latched well and feeding actively.  Encouraged mom to use alternate breast massage during feeding to enhance milk flow.  Mom denies questions.  Outpatient services encouraged prn.  Maternal Data Has patient been taught Hand Expression?: Yes Does the patient have breastfeeding experience prior to this delivery?: Yes  Feeding Feeding Type: Breast Fed  LATCH Score                   Interventions    Lactation Tools Discussed/Used     Consult Status Consult Status: Complete    Huston FoleyMOULDEN, [place] Bills S 05/12/2017, 8:42 AM

## 2017-05-12 NOTE — Discharge Summary (Signed)
OB Discharge Summary     Patient Name: Marisa Stephens DOB: June 15, 1991 MRN: [id number]  Date of admission: 05/10/2017 Delivering MD: Pincus LargePHELPS, JAZMA Y   Date of discharge: 05/12/2017  Admitting diagnosis: INDUCTION Intrauterine pregnancy: 4442w0d     Secondary diagnosis:  Active Problems:   Post-dates pregnancy   Post term pregnancy at [redacted] weeks gestation  Additional problems: Patient Active Problem List   Diagnosis Date Noted  . Post-dates pregnancy 05/10/2017  . Post term pregnancy at [redacted] weeks gestation 05/10/2017  . Group B Streptococcus carrier, +RV culture, currently pregnant 04/15/2017  . Unwanted fertility 04/08/2017  . Short cervix during pregnancy in third trimester 02/03/2017  . History of premature delivery, currently pregnant 10/19/2016  . Supervision of high risk pregnancy, antepartum 10/16/2016       Discharge diagnosis: Term Pregnancy Delivered                                                                                                Post partum procedures:none  Augmentation: AROM and Pitocin  Complications: None  Hospital course:  Induction of Labor With Vaginal Delivery   26 y.o. yo [id number] at 3242w0d was admitted to the hospital 05/10/2017 for induction of labor.  Indication for induction: Postdates.  Patient had an uncomplicated labor course as follows: Membrane Rupture Time/Date: 2:52 PM ,05/10/2017   Intrapartum Procedures: Episiotomy: None [1]                                         Lacerations:  None [1]  Patient had delivery of a Viable infant.  Information for the patient's newborn:  Julio SicksJohnson, Girl Talar [[id number]][[id number]]  Delivery Method: Vag-Spont   05/10/2017  Details of delivery can be found in separate delivery note.  Patient had a routine postpartum course. Patient is discharged home 05/12/17.  Physical exam  Vitals:   05/10/17 2220 05/11/17 0621 05/11/17 1815 05/12/17 0500  BP: 121/61 117/64 124/73 119/74  Pulse: 87 72    Resp: 18 16  18 18   Temp: 98.1 F (36.7 C) 98.2 F (36.8 C) 98.6 F (37 C) 98.3 F (36.8 C)  TempSrc: Oral Oral Oral Oral  SpO2: 98%     Weight:      Height:       General: alert, cooperative and no distress Lochia: appropriate Uterine Fundus: firm Incision: N/A DVT Evaluation: No evidence of DVT seen on physical exam. Labs: Lab Results  Component Value Date   WBC 7.5 05/10/2017   HGB 13.1 05/10/2017   HCT 38.2 05/10/2017   MCV 95.3 05/10/2017   PLT 192 05/10/2017   CMP Latest Ref Rng & Units 10/19/2016  Glucose 65 - 99 mg/dL 91  BUN 6 - 20 mg/dL 7  Creatinine 6.960.57 - 2.951.00 mg/dL 2.840.62  Sodium 132134 - 440144 mmol/L 137  Potassium 3.5 - 5.2 mmol/L 3.5  Chloride 96 - 106 mmol/L 98  CO2 18 - 29 mmol/L 23  Calcium 8.7 - 10.2 mg/dL 9.8  Total Protein  6.0 - 8.5 g/dL 7.0  Total Bilirubin 0.0 - 1.2 mg/dL 0.9  Alkaline Phos 39 - 117 IU/L 76  AST 0 - 40 IU/L 12  ALT 0 - 32 IU/L 10    Discharge instruction: per After Visit Summary and "Baby and Me Booklet".  After visit meds:  Allergies as of 05/12/2017   No Known Allergies     Medication List    STOP taking these medications   acetaminophen 325 MG tablet Commonly known as:  TYLENOL   clotrimazole 1 % cream Commonly known as:  LOTRIMIN     TAKE these medications   ibuprofen 600 MG tablet Commonly known as:  ADVIL,MOTRIN Take 1 tablet (600 mg total) every 6 (six) hours by mouth.   prenatal vitamin w/FE, FA 27-1 MG Tabs tablet Take 1 tablet by mouth daily at 12 noon.   senna-docusate 8.6-50 MG tablet Commonly known as:  Senokot-S Take 2 tablets daily by mouth. Start taking on:  05/13/2017       Diet: routine diet  Activity: Advance as tolerated. Pelvic rest for 6 weeks.   Outpatient follow up:4 weeks Follow up Appt: Future Appointments  Date Time Provider Department Center  06/08/2017  2:00 PM [name], James G, MD [facility] None   Follow up Visit:No Follow-up on file.  Postpartum contraception: IUD Mirena  Newborn  Data: Live born female  Birth Weight: 7 lb 2.1 oz (3235 g) APGAR: 8, 9  Newborn Delivery   Time head delivered:  05/10/2017 15:03:00 Birth date/time:  05/10/2017 15:04:00 Delivery type:  Vaginal, Spontaneous     Baby Feeding: Bottle and Breast Disposition:home with mother   05/12/2017 Suella BroadKeriann S Silvio Sausedo, MD

## 2017-06-08 ENCOUNTER — Ambulatory Visit: Payer: Medicaid Other | Admitting: Obstetrics & Gynecology

## 2017-06-15 ENCOUNTER — Ambulatory Visit: Payer: Medicaid Other | Admitting: Obstetrics & Gynecology

## 2018-04-11 ENCOUNTER — Inpatient Hospital Stay (HOSPITAL_COMMUNITY): Payer: Medicaid Other

## 2018-04-11 ENCOUNTER — Inpatient Hospital Stay (HOSPITAL_COMMUNITY)
Admission: AD | Admit: 2018-04-11 | Discharge: 2018-04-11 | Disposition: A | Discharge status: Home / Self Care | Payer: Medicaid Other | Source: Ambulatory Visit | Attending: Obstetrics and Gynecology | Admitting: Obstetrics and Gynecology

## 2018-04-11 ENCOUNTER — Other Ambulatory Visit: Payer: Self-pay

## 2018-04-11 ENCOUNTER — Encounter (HOSPITAL_COMMUNITY): Payer: Self-pay | Admitting: *Deleted

## 2018-04-11 DIAGNOSIS — Z3A09 9 weeks gestation of pregnancy: Secondary | ICD-10-CM | POA: Diagnosis not present

## 2018-04-11 DIAGNOSIS — F17210 Nicotine dependence, cigarettes, uncomplicated: Secondary | ICD-10-CM | POA: Diagnosis not present

## 2018-04-11 DIAGNOSIS — O99331 Smoking (tobacco) complicating pregnancy, first trimester: Secondary | ICD-10-CM | POA: Insufficient documentation

## 2018-04-11 DIAGNOSIS — B9689 Other specified bacterial agents as the cause of diseases classified elsewhere: Secondary | ICD-10-CM

## 2018-04-11 DIAGNOSIS — O209 Hemorrhage in early pregnancy, unspecified: Secondary | ICD-10-CM | POA: Diagnosis not present

## 2018-04-11 DIAGNOSIS — O3680X0 Pregnancy with inconclusive fetal viability, not applicable or unspecified: Secondary | ICD-10-CM | POA: Diagnosis not present

## 2018-04-11 DIAGNOSIS — O219 Vomiting of pregnancy, unspecified: Secondary | ICD-10-CM | POA: Diagnosis not present

## 2018-04-11 DIAGNOSIS — N760 Acute vaginitis: Secondary | ICD-10-CM

## 2018-04-11 LAB — WET PREP, GENITAL
Sperm: NONE SEEN
TRICH WET PREP: NONE SEEN
Yeast Wet Prep HPF POC: NONE SEEN

## 2018-04-11 LAB — URINALYSIS, ROUTINE W REFLEX MICROSCOPIC
Bilirubin Urine: NEGATIVE
Glucose, UA: NEGATIVE mg/dL
Ketones, ur: NEGATIVE mg/dL
Nitrite: NEGATIVE
Protein, ur: 30 mg/dL — AB
Specific Gravity, Urine: 1.011 (ref 1.005–1.030)
WBC, UA: >50 WBC/hpf — ABNORMAL HIGH (ref 0–5)
pH: 7 (ref 5.0–8.0)

## 2018-04-11 LAB — CBC
HEMATOCRIT: 34.8 % — AB (ref 36.0–46.0)
HEMOGLOBIN: 12.2 g/dL (ref 12.0–15.0)
MCH: 32.6 pg (ref 26.0–34.0)
MCHC: 35.1 g/dL (ref 30.0–36.0)
MCV: 93 fL (ref 80.0–100.0)
NRBC: 0 % (ref 0.0–0.2)
Platelets: 303 10*3/uL (ref 150–400)
RBC: 3.74 MIL/uL — ABNORMAL LOW (ref 3.87–5.11)
RDW: 12.2 % (ref 11.5–15.5)
WBC: 9.8 10*3/uL (ref 4.0–10.5)

## 2018-04-11 LAB — HCG, QUANTITATIVE, PREGNANCY: hCG, Beta Chain, Quant, S: 153445 m[IU]/mL — ABNORMAL HIGH (ref <5)

## 2018-04-11 LAB — POCT PREGNANCY, URINE: Preg Test, Ur: POSITIVE — AB

## 2018-04-11 MED ORDER — PROMETHAZINE HCL 25 MG PO TABS: 25.0000 mg | ORAL_TABLET | Freq: Four times a day (QID) | ORAL | 2 refills | Status: DC | PRN

## 2018-04-11 MED ORDER — PROMETHAZINE HCL 25 MG PO TABS
25.0000 mg | ORAL_TABLET | Freq: Once | ORAL | Status: AC
  Administered 2018-04-11: 25 mg via ORAL
  Filled 2018-04-11: qty 1

## 2018-04-11 MED ORDER — CEPHALEXIN 500 MG PO CAPS
500.0000 mg | ORAL_CAPSULE | Freq: Four times a day (QID) | ORAL | 0 refills | Status: DC
Start: 2018-04-11 — End: 2018-09-29

## 2018-04-11 MED ORDER — PRENATAL VITAMINS 0.8 MG PO TABS: 1.0000 | ORAL_TABLET | Freq: Every day | ORAL | 12 refills | Status: DC

## 2018-04-11 NOTE — MAU Provider Note (Signed)
Chief Complaint: Abdominal Pain; Vaginal Bleeding; Possible Pregnancy; and Nausea   Provider saw patient at 1045hrs      SUBJECTIVE HPI: Marisa Stephens is a 27 y.o. [id number] at Unknown by LMP who presents to maternity admissions reporting vaginal spotting, nausea, vomiting and intermittent pain in her left lower ribs.  Just had a baby a year ago and never got her IUD due to fear of it.  LMP two cycles in August.  Spotting is intermittent.  Ribs "feel like they are broken"   Vomits some times, does not feel like eating.. She denies vaginal itching/burning, urinary symptoms, h/a, dizziness, or fever/chills.     Vaginal Bleeding  The patient's primary symptoms include vaginal bleeding. The patient's pertinent negatives include no genital itching, genital lesions, genital odor or pelvic pain. This is a new problem. The current episode started in the past 7 days. The problem occurs intermittently. The patient is experiencing no pain. She is pregnant. Associated symptoms include anorexia, nausea and vomiting. Pertinent negatives include no abdominal pain, back pain, chills, constipation, diarrhea, fever or headaches. The vaginal discharge was bloody. The vaginal bleeding is spotting. She has not been passing clots. She has not been passing tissue. Nothing aggravates the symptoms. She has tried nothing for the symptoms. She is sexually active. She uses nothing for contraception.  Possible Pregnancy  This is a new problem. The current episode started in the past 7 days. Associated symptoms include anorexia, nausea and vomiting. Pertinent negatives include no abdominal pain, chills, fever or headaches. Nothing aggravates the symptoms. She has tried nothing for the symptoms.   RN Note: Pt presents to MAU with complaints of a + pregnancy test last Wednesday oct 9th, with vaginal spotting, nausea, pain in her ribs 2 days ago.  Past Medical History:  Diagnosis Date  . Chronic kidney disease    treated  .  Former smoker   . Group beta Strep positive 2016  . Headache    migraines; Tylenol helps  . Hx of chlamydia infection 2010   treated  . 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 Medical history non-contributory   . No pertinent past medical history    Past Surgical History:  Procedure Laterality Date  . NO PAST SURGERIES     Social History   Socioeconomic History  . Marital status: Single    Spouse name: Not on file  . Number of children: Not on file  . Years of education: Not on file  . Highest education level: Not on file  Occupational History  . Not on file  Social Needs  . Financial resource strain: Not on file  . Food insecurity:    Worry: Not on file    Inability: Not on file  . Transportation needs:    Medical: Not on file    Non-medical: Not on file  Tobacco Use  . Smoking status: Current Some Day Smoker    Packs/day: 0.25    Years: 5.00    Pack years: 1.25    Types: Cigarettes  . Smokeless tobacco: Never Used  Substance and Sexual Activity  . Alcohol use: No  . Drug use: Yes    Types: Marijuana    Comment: not using at this moment  . Sexual activity: Yes    Birth control/protection: None    Comment: currently pregnant  Lifestyle  . Physical activity:    Days per week: Not on file    Minutes per session: Not on file  .  Stress: Not on file  Relationships  . Social connections:    Talks on phone: Not on file    Gets together: Not on file    Attends religious service: Not on file    Active member of club or organization: Not on file    Attends meetings of clubs or organizations: Not on file    Relationship status: Not on file  . Intimate partner violence:    Fear of current or ex partner: Not on file    Emotionally abused: Not on file    Physically abused: Not on file    Forced sexual activity: Not on file  Other Topics Concern  . Not on file  Social History Narrative  . Not on file   No current facility-administered medications on file prior to  encounter.    Current Outpatient Medications on File Prior to Encounter  Medication Sig Dispense Refill  . ibuprofen (ADVIL,MOTRIN) 600 MG tablet Take 1 tablet (600 mg total) every 6 (six) hours by mouth. 30 tablet 0  . prenatal vitamin w/FE, FA (PRENATAL 1 + 1) 27-1 MG TABS tablet Take 1 tablet by mouth daily at 12 noon. 30 each 3  . senna-docusate (SENOKOT-S) 8.6-50 MG tablet Take 2 tablets daily by mouth. 60 tablet 0   No Known Allergies  I have reviewed patient's Past Medical Hx, Surgical Hx, Family Hx, Social Hx, medications and allergies.   ROS:  Review of Systems  Constitutional: Negative for chills and fever.  Gastrointestinal: Positive for anorexia, nausea and vomiting. Negative for abdominal pain, constipation and diarrhea.  Genitourinary: Positive for vaginal bleeding. Negative for pelvic pain.  Musculoskeletal: Negative for back pain.  Neurological: Negative for headaches.   Review of Systems  Other systems negative   Physical Exam  Physical Exam Patient Vitals for the past 24 hrs:  BP Temp Pulse Resp SpO2 Weight  04/11/18 1019 124/90 98.6 F (37 C) 90 18 100 % 55.3 kg   Constitutional: Well-developed, female in no acute distress. Denies pain Cardiovascular: normal rate Respiratory: normal effort, Lungs CTAB with no adventitious sounds GI: Abd soft, non-tender. Pos BS x 4 MS: Extremities nontender, no edema, normal ROM Neurologic: Alert and oriented x 4.  GU: Neg CVAT.  PELVIC EXAM: scant white creamy discharge, vaginal walls and external genitalia normal Bimanual exam: Cervix 0/long/high, firm, anterior, neg CMT, uterus nontender, about 5-6wks size,, adnexa without tenderness, enlargement, or mass   LAB RESULTS Results for orders placed or performed during the hospital encounter of 04/11/18 (from the past 24 hour(s))  Pregnancy, urine POC     Status: Abnormal   Collection Time: 04/11/18 10:03 AM  Result Value Ref Range   Preg Test, Ur POSITIVE (A) NEGATIVE   Urinalysis, Routine w reflex microscopic     Status: Abnormal   Collection Time: 04/11/18 10:31 AM  Result Value Ref Range   Color, Urine YELLOW YELLOW   APPearance CLOUDY (A) CLEAR   Specific Gravity, Urine 1.011 1.005 - 1.030   pH 7.0 5.0 - 8.0   Glucose, UA NEGATIVE NEGATIVE mg/dL   Hgb urine dipstick SMALL (A) NEGATIVE   Bilirubin Urine NEGATIVE NEGATIVE   Ketones, ur NEGATIVE NEGATIVE mg/dL   Protein, ur 30 (A) NEGATIVE mg/dL   Nitrite NEGATIVE NEGATIVE   Leukocytes, UA LARGE (A) NEGATIVE   RBC / HPF 0-5 0 - 5 RBC/hpf   WBC, UA >50 (H) 0 - 5 WBC/hpf   Bacteria, UA MANY (A) NONE SEEN   Squamous Epithelial /  LPF 0-5 0 - 5   WBC Clumps PRESENT    Mucus PRESENT   Wet prep, genital     Status: Abnormal   Collection Time: 04/11/18 10:47 AM  Result Value Ref Range   Yeast Wet Prep HPF POC NONE SEEN NONE SEEN   Trich, Wet Prep NONE SEEN NONE SEEN   Clue Cells Wet Prep HPF POC PRESENT (A) NONE SEEN   WBC, Wet Prep HPF POC FEW (A) NONE SEEN   Sperm NONE SEEN   CBC     Status: Abnormal   Collection Time: 04/11/18 10:52 AM  Result Value Ref Range   WBC 9.8 4.0 - 10.5 K/uL   RBC 3.74 (L) 3.87 - 5.11 MIL/uL   Hemoglobin 12.2 12.0 - 15.0 g/dL   HCT 40.9 (L) 81.1 - 91.4 %   MCV 93.0 80.0 - 100.0 fL   MCH 32.6 26.0 - 34.0 pg   MCHC 35.1 30.0 - 36.0 g/dL   RDW 78.2 95.6 - 21.3 %   Platelets 303 150 - 400 K/uL   nRBC 0.0 0.0 - 0.2 %  hCG, quantitative, pregnancy     Status: Abnormal   Collection Time: 04/11/18 10:52 AM  Result Value Ref Range   hCG, Beta Chain, Quant, S 153,445 (H) <5 mIU/mL    --/--/O POS (11/12 1210)  IMAGING US Ob Less Than 14 Weeks With Ob Transvaginal  Result Date: 04/11/2018 CLINICAL DATA:  Bleeding in early pregnancy, assess viability; EGA [redacted] weeks 5 days by LMP of 02/02/2018 EXAM: OBSTETRIC <14 WK [place] AND TRANSVAGINAL OB US TECHNIQUE: Both transabdominal and transvaginal ultrasound examinations were performed for complete evaluation of the gestation as  well as the maternal uterus, adnexal regions, and pelvic cul-de-sac. Transvaginal technique was performed to assess early pregnancy. COMPARISON:  None for this gestation FINDINGS: Intrauterine gestational sac: Present, single Yolk sac:  Present Embryo:  Present Cardiac Activity: Present Heart Rate: 140 bpm CRL:  11.1 mm   7 w   1 d                  [place] EDC: 11/26/2017 Subchorionic hemorrhage:  None visualized. Maternal uterus/adnexae: RIGHT ovary normal size and morphology, 3.4 x 1.9 x 2.6 cm. LEFT ovary normal size and morphology, 2.5 x 1.7 x 1.7 cm. No free pelvic fluid or adnexal masses. IMPRESSION: Single live intrauterine gestation at 7 weeks 1 day EGA by crown-rump length. No acute abnormalities. Electronically Signed   By: Ulyses Southward M.D.   On: 04/11/2018 12:19   MAU Management/MDM: Ordered usual first trimester r/o ectopic labs.   Pelvic exam and cultures done Will check baseline Ultrasound to rule out ectopic.  This bleeding/pain can represent a normal pregnancy with bleeding, spontaneous abortion or even an ectopic which can be life-threatening.  The process as listed above helps to determine which of these is present.  Reviewed findings with patient. She plans to go back to [facility] for care but wants to make sure she can arrange a waterbirth this time. Wasn't able to see CNM last pregnancy  ASSESSMENT 1. Bleeding in early pregnancy   2.    Pregnancy of unknown location 3.     Pregnancy at  [redacted]w[redacted]d by LMP, [redacted]w[redacted]d by [place], live fetus   PLAN Discharge home Encouraged to seek prenatal care soon.   Pt stable at time of discharge. Encouraged to return here or to other Urgent Care/ED if she develops worsening of symptoms, increase in pain, fever, or other concerning  symptoms.    Wynelle Bourgeois CNM, MSN Certified Nurse-Midwife 04/11/2018  10:56 AM

## 2018-04-11 NOTE — MAU Note (Signed)
Urine in lab 

## 2018-04-11 NOTE — MAU Note (Signed)
Pt presents to MAU with complaints of a + pregnancy test last Wednesday oct 9th, with vaginal spotting, nausea, pain in her ribs 2 days ago.

## 2018-04-11 NOTE — Discharge Instructions (Signed)
Morning Sickness °Morning sickness is when you feel sick to your stomach (nauseous) during pregnancy. This nauseous feeling may or may not come with vomiting. It often occurs in the morning but can be a problem any time of day. Morning sickness is most common during the first trimester, but it may continue throughout pregnancy. While morning sickness is unpleasant, it is usually harmless unless you develop severe and continual vomiting (hyperemesis gravidarum). This condition requires more intense treatment. °What are the causes? °The cause of morning sickness is not completely known but seems to be related to normal hormonal changes that occur in pregnancy. °What increases the risk? °You are at greater risk if you: °· Experienced nausea or vomiting before your pregnancy. °· Had morning sickness during a previous pregnancy. °· Are pregnant with more than one baby, such as twins. ° °How is this treated? °Do not use any medicines (prescription, over-the-counter, or herbal) for morning sickness without first talking to your health care provider. Your health care provider may prescribe or recommend: °· Vitamin B6 supplements. °· Anti-nausea medicines. °· The herbal medicine ginger. ° °Follow these instructions at home: °· Only take over-the-counter or prescription medicines as directed by your health care provider. °· Taking multivitamins before getting pregnant can prevent or decrease the severity of morning sickness in most women. °· Eat a piece of dry toast or unsalted crackers before getting out of bed in the morning. °· Eat five or six small meals a day. °· Eat dry and bland foods (rice, baked potato). Foods high in carbohydrates are often helpful. °· Do not drink liquids with your meals. Drink liquids between meals. °· Avoid greasy, fatty, and spicy foods. °· Get someone to cook for you if the smell of any food causes nausea and vomiting. °· If you feel nauseous after taking prenatal vitamins, take the vitamins at  night or with a snack. °· Snack on protein foods (nuts, yogurt, cheese) between meals if you are hungry. °· Eat unsweetened gelatins for desserts. °· Wearing an acupressure wristband (worn for sea sickness) may be helpful. °· Acupuncture may be helpful. °· Do not smoke. °· Get a humidifier to keep the air in your house free of odors. °· Get plenty of fresh air. °Contact a health care provider if: °· Your home remedies are not working, and you need medicine. °· You feel dizzy or lightheaded. °· You are losing weight. °Get help right away if: °· You have persistent and uncontrolled nausea and vomiting. °· You pass out (faint). °This information is not intended to replace advice given to you by your health care provider. Make sure you discuss any questions you have with your health care provider. °Document Released: 08/06/2006 Document Revised: 11/21/2015 Document Reviewed: 11/30/2012 °Elsevier Interactive Patient Education © 2017 Elsevier Inc. °Bacterial Vaginosis °Bacterial vaginosis is a vaginal infection that occurs when the normal balance of bacteria in the vagina is disrupted. It results from an overgrowth of certain bacteria. This is the most common vaginal infection among women ages 15-44. °Because bacterial vaginosis increases your risk for STIs (sexually transmitted infections), getting treated can help reduce your risk for chlamydia, gonorrhea, herpes, and HIV (human immunodeficiency virus). Treatment is also important for preventing complications in pregnant women, because this condition can cause an early (premature) delivery. °What are the causes? °This condition is caused by an increase in harmful bacteria that are normally present in small amounts in the vagina. However, the reason that the condition develops is not fully understood. °What   increases the risk? °The following factors may make you more likely to develop this condition: °· Having a new sexual partner or multiple sexual partners. °· Having  unprotected sex. °· Douching. °· Having an intrauterine device (IUD). °· Smoking. °· Drug and alcohol abuse. °· Taking certain antibiotic medicines. °· Being pregnant. ° °You cannot get bacterial vaginosis from toilet seats, bedding, swimming pools, or contact with objects around you. °What are the signs or symptoms? °Symptoms of this condition include: °· Grey or white vaginal discharge. The discharge can also be watery or foamy. °· A fish-like odor with discharge, especially after sexual intercourse or during menstruation. °· Itching in and around the vagina. °· Burning or pain with urination. ° °Some women with bacterial vaginosis have no signs or symptoms. °How is this diagnosed? °This condition is diagnosed based on: °· Your medical history. °· A physical exam of the vagina. °· Testing a sample of vaginal fluid under a microscope to look for a large amount of bad bacteria or abnormal cells. Your health care provider may use a cotton swab or a small wooden spatula to collect the sample. ° °How is this treated? °This condition is treated with antibiotics. These may be given as a pill, a vaginal cream, or a medicine that is put into the vagina (suppository). If the condition comes back after treatment, a second round of antibiotics may be needed. °Follow these instructions at home: °Medicines °· Take over-the-counter and prescription medicines only as told by your health care provider. °· Take or use your antibiotic as told by your health care provider. Do not stop taking or using the antibiotic even if you start to feel better. °General instructions °· If you have a female sexual partner, tell her that you have a vaginal infection. She should see her health care provider and be treated if she has symptoms. If you have a female sexual partner, he does not need treatment. °· During treatment: °? Avoid sexual activity until you finish treatment. °? Do not douche. °? Avoid alcohol as directed by your health care  provider. °? Avoid breastfeeding as directed by your health care provider. °· Drink enough water and fluids to keep your urine clear or pale yellow. °· Keep the area around your vagina and rectum clean. °? Wash the area daily with warm water. °? Wipe yourself from front to back after using the toilet. °· Keep all follow-up visits as told by your health care provider. This is important. °How is this prevented? °· Do not douche. °· Wash the outside of your vagina with warm water only. °· Use protection when having sex. This includes latex condoms and dental dams. °· Limit how many sexual partners you have. To help prevent bacterial vaginosis, it is best to have sex with just one partner (monogamous). °· Make sure you and your sexual partner are tested for STIs. °· Wear cotton or cotton-lined underwear. °· Avoid wearing tight pants and pantyhose, especially during summer. °· Limit the amount of alcohol that you drink. °· Do not use any products that contain nicotine or tobacco, such as cigarettes and e-cigarettes. If you need help quitting, ask your health care provider. °· Do not use illegal drugs. °Where to find more information: °· Centers for Disease Control and Prevention: www.cdc.gov/std °· American Sexual Health Association (ASHA): www.ashastd.org °· U.S. Department of Health and Human Services, Office on Women's Health: www.womenshealth.gov/ or https://www.womenshealth.gov/a-z-topics/bacterial-vaginosis °Contact a health care provider if: °· Your symptoms do not improve, even after   treatment.  You have more discharge or pain when urinating.  You have a fever.  You have pain in your abdomen.  You have pain during sex.  You have vaginal bleeding between periods. Summary  Bacterial vaginosis is a vaginal infection that occurs when the normal balance of bacteria in the vagina is disrupted.  Because bacterial vaginosis increases your risk for STIs (sexually transmitted infections), getting treated can  help reduce your risk for chlamydia, gonorrhea, herpes, and HIV (human immunodeficiency virus). Treatment is also important for preventing complications in pregnant women, because the condition can cause an early (premature) delivery.  This condition is treated with antibiotic medicines. These may be given as a pill, a vaginal cream, or a medicine that is put into the vagina (suppository). This information is not intended to replace advice given to you by your health care provider. Make sure you discuss any questions you have with your health care provider. Document Released: 06/15/2005 Document Revised: 10/19/2016 Document Reviewed: 02/29/2016 Elsevier Interactive Patient Education  2018 [employer]. First Trimester of Pregnancy The first trimester of pregnancy is from week 1 until the end of week 13 (months 1 through 3). During this time, your baby will begin to develop inside you. At 6-8 weeks, the eyes and face are formed, and the heartbeat can be seen on ultrasound. At the end of 12 weeks, all the baby's organs are formed. Prenatal care is all the medical care you receive before the birth of your baby. Make sure you get good prenatal care and follow all of your doctor's instructions. Follow these instructions at home: Medicines  Take over-the-counter and prescription medicines only as told by your doctor. Some medicines are safe and some medicines are not safe during pregnancy.  Take a prenatal vitamin that contains at least 600 micrograms (mcg) of folic acid.  If you have trouble pooping (constipation), take medicine that will make your stool soft (stool softener) if your doctor approves. Eating and drinking  Eat regular, healthy meals.  Your doctor will tell you the amount of weight gain that is right for you.  Avoid raw meat and uncooked cheese.  If you feel sick to your stomach (nauseous) or throw up (vomit): ? Eat 4 or 5 small meals a day instead of 3 large meals. ? Try eating a  few soda crackers. ? Drink liquids between meals instead of during meals.  To prevent constipation: ? Eat foods that are high in fiber, like fresh fruits and vegetables, whole grains, and beans. ? Drink enough fluids to keep your pee (urine) clear or pale yellow. Activity  Exercise only as told by your doctor. Stop exercising if you have cramps or pain in your lower belly (abdomen) or low back.  Do not exercise if it is too hot, too humid, or if you are in a place of great height (high altitude).  Try to avoid standing for long periods of time. Move your legs often if you must stand in one place for a long time.  Avoid heavy lifting.  Wear low-heeled shoes. Sit and stand up straight.  You can have sex unless your doctor tells you not to. Relieving pain and discomfort  Wear a good support bra if your breasts are sore.  Take warm water baths (sitz baths) to soothe pain or discomfort caused by hemorrhoids. Use hemorrhoid cream if your doctor says it is okay.  Rest with your legs raised if you have leg cramps or low back pain.  If  you have puffy, bulging veins (varicose veins) in your legs: ? Wear support hose or compression stockings as told by your doctor. ? Raise (elevate) your feet for 15 minutes, 3-4 times a day. ? Limit salt in your food. Prenatal care  Schedule your prenatal visits by the twelfth week of pregnancy.  Write down your questions. Take them to your prenatal visits.  Keep all your prenatal visits as told by your doctor. This is important. Safety  Wear your seat belt at all times when driving.  Make a list of emergency phone numbers. The list should include numbers for family, friends, the hospital, and police and fire departments. General instructions  Ask your doctor for a referral to a local prenatal class. Begin classes no later than at the start of month 6 of your pregnancy.  Ask for help if you need counseling or if you need help with nutrition. Your  doctor can give you advice or tell you where to go for help.  Do not use hot tubs, steam rooms, or saunas.  Do not douche or use tampons or scented sanitary pads.  Do not cross your legs for long periods of time.  Avoid all herbs and alcohol. Avoid drugs that are not approved by your doctor.  Do not use any tobacco products, including cigarettes, chewing tobacco, and electronic cigarettes. If you need help quitting, ask your doctor. You may get counseling or other support to help you quit.  Avoid cat litter boxes and soil used by cats. These carry germs that can cause birth defects in the baby and can cause a loss of your baby (miscarriage) or stillbirth.  Visit your dentist. At home, brush your teeth with a soft toothbrush. Be gentle when you floss. Contact a doctor if:  You are dizzy.  You have mild cramps or pressure in your lower belly.  You have a nagging pain in your belly area.  You continue to feel sick to your stomach, you throw up, or you have watery poop (diarrhea).  You have a bad smelling fluid coming from your vagina.  You have pain when you pee (urinate).  You have increased puffiness (swelling) in your face, hands, legs, or ankles. Get help right away if:  You have a fever.  You are leaking fluid from your vagina.  You have spotting or bleeding from your vagina.  You have very bad belly cramping or pain.  You gain or lose weight rapidly.  You throw up blood. It may look like coffee grounds.  You are around people who have [place] measles, fifth disease, or chickenpox.  You have a very bad headache.  You have shortness of breath.  You have any kind of trauma, such as from a fall or a car accident. Summary  The first trimester of pregnancy is from week 1 until the end of week 13 (months 1 through 3).  To take care of yourself and your unborn baby, you will need to eat healthy meals, take medicines only if your doctor tells you to do so, and do  activities that are safe for you and your baby.  Keep all follow-up visits as told by your doctor. This is important as your doctor will have to ensure that your baby is healthy and growing well. This information is not intended to replace advice given to you by your health care provider. Make sure you discuss any questions you have with your health care provider. Document Released: 12/02/2007 Document Revised: 06/23/2016 Document  Reviewed: 06/23/2016 Elsevier Interactive Patient Education  2017 [employer].

## 2018-04-12 LAB — CERVICOVAGINAL ANCILLARY ONLY
CHLAMYDIA, DNA PROBE: NEGATIVE
Neisseria Gonorrhea: NEGATIVE

## 2018-04-12 LAB — HIV ANTIBODY (ROUTINE TESTING W REFLEX): HIV Screen 4th Generation wRfx: NONREACTIVE

## 2018-04-13 ENCOUNTER — Encounter: Payer: Self-pay | Admitting: Advanced Practice Midwife

## 2018-04-13 DIAGNOSIS — O2340 Unspecified infection of urinary tract in pregnancy, unspecified trimester: Secondary | ICD-10-CM | POA: Insufficient documentation

## 2018-04-13 LAB — CULTURE, OB URINE: Culture: >=100000 — AB

## 2018-05-01 ENCOUNTER — Encounter: Payer: Self-pay | Admitting: Advanced Practice Midwife

## 2018-05-04 ENCOUNTER — Encounter: Payer: Self-pay | Admitting: Advanced Practice Midwife

## 2018-05-04 ENCOUNTER — Ambulatory Visit (INDEPENDENT_AMBULATORY_CARE_PROVIDER_SITE_OTHER): Payer: Medicaid Other | Admitting: Advanced Practice Midwife

## 2018-05-04 ENCOUNTER — Other Ambulatory Visit (HOSPITAL_COMMUNITY)
Admission: RE | Admit: 2018-05-04 | Discharge: 2018-05-04 | Disposition: A | Discharge status: Home / Self Care | Payer: Medicaid Other | Source: Ambulatory Visit | Attending: Advanced Practice Midwife | Admitting: Advanced Practice Midwife

## 2018-05-04 VITALS — BP 124/76 | HR 94 | Wt 126.3 lb

## 2018-05-04 DIAGNOSIS — Z3480 Encounter for supervision of other normal pregnancy, unspecified trimester: Secondary | ICD-10-CM

## 2018-05-04 DIAGNOSIS — O09891 Supervision of other high risk pregnancies, first trimester: Secondary | ICD-10-CM

## 2018-05-04 DIAGNOSIS — O09211 Supervision of pregnancy with history of pre-term labor, first trimester: Secondary | ICD-10-CM

## 2018-05-04 DIAGNOSIS — Z3481 Encounter for supervision of other normal pregnancy, first trimester: Secondary | ICD-10-CM

## 2018-05-04 NOTE — Addendum Note (Signed)
Addended by: Clayton Bibles C on: 05/04/2018 11:47 AM   Modules accepted: Orders

## 2018-05-04 NOTE — Patient Instructions (Signed)
Considering Waterbirth? Guide for patients at [facility]  Why consider waterbirth?  . Gentle birth for babies . Less pain medicine used in labor . May allow for passive descent/less pushing . May reduce perineal tears  . More mobility and instinctive maternal position changes . Increased maternal relaxation . Reduced blood pressure in labor  Is waterbirth safe? What are the risks of infection, drowning or other complications?  . Infection: o Very low risk (3.7 % for tub vs 4.8% for bed) o 7 in 8000 waterbirths with documented infection o Poorly cleaned equipment most common cause o Slightly lower group B strep transmission rate  . Drowning o Maternal:  - Very low risk   - Related to seizures or fainting o Newborn:  - Very low risk. No evidence of increased risk of respiratory problems in multiple large studies - Physiological protection from breathing under water - Avoid underwater birth if there are any fetal complications - Once baby's head is out of the water, keep it out.  . Birth complication o Some reports of cord trauma, but risk decreased by bringing baby to surface gradually o No evidence of increased risk of shoulder dystocia. Mothers can usually change positions faster in water than in a bed, possibly aiding the maneuvers to free the shoulder.   Am I a candidate for waterbirth?  Yes, if you are: . Full-term (37 weeks or greater)  . Have had an uncomplicated pregnancy and labor  No, if you have: . Preterm birth less than 37 weeks . Thick, particulate meconium stained fluid . Maternal fever over 101 . Heavy bleeding or signs of placental abruption . Pre-eclampsia  . Any abnormal fetal heart rate pattern . Breech presentation . Twins  . Very large baby . Active communicable infection (this does NOT include group B strep) . Significant limitation to mobility  Please remember that birth is unpredictable. Under certain unforeseeable  circumstances your provider may advise against giving birth in the tub. These decisions will be made on a case-by-case basis and with the safety of you and your baby as our highest priority.  Requirements for patients planning waterbirth  . Ask your midwife if you will be a candidate for waterbirth. . Attend the Waterbirth Class at [facility]. Contact Childbirth Education at [phone] or [phone] for dates and times. The class is free and we strongly encourage you to bring your support person. You will receive a certificate of participation to show to your midwife or doctor. . Supplies needed for [facility] and [facility] patients: o Single-use disposable tub liner (birthpoolinabox.com  REGULAR size) o New garden hose labeled "lead-free", "suitable for drinking water", "non-toxic" OR "water potable" o Garden hose to remove the dirty water o Faucet adaptor to attach hose to faucet         o Electric drain pump to remove water (We recommend 792 gallon per hour or greater pump.)  o Fish net o Bathing suit top (optional) o Long-handled mirror (optional)  [email or web address].[email or web address] sells tubs for $120 if you would rather purchase your own tub     

## 2018-05-04 NOTE — Progress Notes (Addendum)
Subjective:   Marisa Stephens is a 27 y.o. [id number] at [redacted]w[redacted]d by 7 week ultrasound being seen today for her first obstetrical visit.  Her obstetrical history is significant for preterm birth, short interval pregnancy. Patient does intend to breast feed. Pregnancy history fully reviewed.  Patient reports no complaints.  HISTORY: OB History  Gravida Para Term Preterm AB Living  7 5 4 1 1 5   SAB TAB Ectopic Multiple Live Births  1 0 0 0 5    # Outcome Date GA Lbr Len/2nd Weight Sex Delivery Anes PTL Lv  7 Current           6 Term 05/10/17 [redacted]w[redacted]d 03:46 / 00:08 7 lb 2.1 oz (3.235 kg) F Vag-Spont EPI  LIV     Birth Comments: WNL     Name: AURORA, RODY     Apgar1: 8  Apgar5: 9  5 Term 01/29/15 [redacted]w[redacted]d 01:38 / 00:06 6 lb 3.5 oz (2.821 kg) F Vag-Spont None  LIV     Apgar1: 9  Apgar5: 9  4 Preterm 12/19/13 [redacted]w[redacted]d 04:29 / 00:06 4 lb 9 oz (2.07 kg) M Vag-Spont None  LIV     Birth Comments: premature     Name: Demeo,BOY Oberia     Apgar1: 8  Apgar5: 8  3 Term 12/06/12 [redacted]w[redacted]d 08:21 / 00:16 6 lb 8.4 oz (2.96 kg) F Vag-Spont EPI  LIV     Birth Comments: WNL     Name: CLANCY, LEINER     Apgar1: 9  Apgar5: 9  2 SAB 2012          1 Term 2008    F Vag-Spont   LIV    Last pap smear was done 04//23/2019 and was normal  Past Medical History:  Diagnosis Date  . Chronic kidney disease    treated  . Former smoker   . Group beta Strep positive 2016  . Headache    migraines; Tylenol helps  . Hx of chlamydia infection 2010   treated  . 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 Medical history non-contributory   . No pertinent past medical history   . Post term pregnancy at [redacted] weeks gestation 05/10/2017  . Supervision of high risk pregnancy, antepartum 10/16/2016    Clinic [facility] Prenatal Labs Dating  LMP 07/05/16 Blood type: O/Positive/-- (04/23 1617)  Genetic Screen AFP:     NIPS:Genome First screen neg Antibody:Negative (04/23 1617) Anatomic [place]  normal, posterior placenta Rubella:  4.15 (04/23 1617) GTT Third trimester: nl 2hr RPR: Non Reactive (08/16 0853)  Flu vaccine 04-08-17 HBsAg: Negative (04/23 1617)  TDaP vaccine  02/11/17                              . Unwanted fertility 04/08/2017   Past Surgical History:  Procedure Laterality Date  . NO PAST SURGERIES     Family History  Problem Relation Age of Onset  . Diabetes Maternal Grandmother    Social History   Tobacco Use  . Smoking status: Former Smoker    Packs/day: 0.25    Years: 5.00    Pack years: 1.25    Types: Cigarettes  . Smokeless tobacco: Never Used  Substance Use Topics  . Alcohol use: No  . Drug use: Not Currently    Types: Marijuana    Comment: not using at this moment   No Known Allergies Current Outpatient Medications on  File Prior to Visit  Medication Sig Dispense Refill  . Prenatal Multivit-Min-Fe-FA (PRENATAL VITAMINS) 0.8 MG tablet Take 1 tablet by mouth daily. 30 tablet 12  . promethazine (PHENERGAN) 25 MG tablet Take 1 tablet (25 mg total) by mouth every 6 (six) hours as needed for nausea or vomiting. 30 tablet 2  . acetaminophen (TYLENOL) 500 MG tablet Take 1,000 mg by mouth every 8 (eight) hours as needed for mild pain or headache.    . cephALEXin (KEFLEX) 500 MG capsule Take 1 capsule (500 mg total) by mouth 4 (four) times daily. (Patient not taking: Reported on 05/04/2018) 28 capsule 0   No current facility-administered medications on file prior to visit.     Review of Systems Pertinent items noted in HPI and remainder of comprehensive ROS otherwise negative.  Exam   Vitals:   05/04/18 1031  BP: 124/76  Pulse: 94  Weight: 126 lb 4.8 oz (57.3 kg)      Uterus:     Pelvic Exam: Perineum: no hemorrhoids, normal perineum   Vulva: normal external genitalia, no lesions   Vagina:  normal mucosa, normal discharge   Cervix: no lesions and normal, pap smear done.    Adnexa: normal adnexa and no mass, fullness, tenderness   Bony Pelvis: average  System: General:  well-developed, well-nourished female in no acute distress   Breasts:  normal appearance, no masses or tenderness bilaterally   Skin: normal coloration and turgor, no rashes   Neurologic: oriented, normal, negative, normal mood   Extremities: normal strength, tone, and muscle mass, ROM of all joints is normal   HEENT PERRLA, extraocular movement intact and sclera clear, anicteric   Mouth/Teeth mucous membranes moist, pharynx normal without lesions and dental hygiene good   Neck supple and no masses   Cardiovascular: regular rate and rhythm   Respiratory:  no respiratory distress, normal breath sounds   Abdomen: soft, non-tender; bowel sounds normal; no masses,  no organomegaly   Assessment:   Pregnancy: [id number] Patient Active Problem List   Diagnosis Date Noted  . Supervision of other normal pregnancy, antepartum 05/04/2018  . Short interval between pregnancies affecting pregnancy in first trimester, antepartum 05/04/2018  . UTI (urinary tract infection) during pregnancy 04/13/2018  . Short cervix during pregnancy in third trimester 02/03/2017  . History of premature delivery, currently pregnant 10/19/2016     Plan:  1. Supervision of other normal pregnancy, antepartum - No complaints or concerns, managing nausea with medications prescribed in [facility] - Normal Pap 10/19/16. Not performed today - Obstetric Panel, Including HIV - Urine cytology ancillary only - Genetic Screening - Hemoglobinopathy evaluation - [place] MFM OB COMP + 14 WK; Future  2. History of preterm delivery, currently pregnant in first trimester -- Plan to initiate weekly 17P at [redacted] weeks GA. Confirmed with patient this is still advised even though P5 and P6 were term -- [place] MFM OB LIMITED; Future for cervical length at 16 weeks.   3. Short interval between pregnancies affecting pregnancy in first trimester, antepartum --SVD 05/10/2017  4. Waterbirth --Patient verbalizes interest in waterbirth. Clarified criteria for  consideration (See AVS). Also confirmed she would need to be compliant with advised interventions including 17P and serial ultrasounds as indicated --Patient understands she needs to see midwives at least intermittently throughout her pregnancy  Initial labs drawn. Continue prenatal vitamins. Genetic Screening discussed, First trimester screen, Quad screen and NIPS: ordered. Ultrasound discussed; fetal anatomic survey: ordered.  Problem list reviewed and updated. The nature of  [facility] with multiple MDs and other Advanced Practice Providers was explained to patient; also emphasized that residents, students are part of our team. Routine obstetric precautions reviewed. Return in about 4 weeks (around 06/01/2018).  Clayton Bibles, CNM 05/04/18 11:46 AM

## 2018-05-04 NOTE — Progress Notes (Signed)
Pt is here for initial OB visit. [id number] [redacted]w[redacted]d by [place] on 04/11/18. Last pap 10/19/16 normal.

## 2018-05-05 LAB — URINE CYTOLOGY ANCILLARY ONLY
Chlamydia: NEGATIVE
Neisseria Gonorrhea: NEGATIVE

## 2018-05-07 LAB — HEMOGLOBINOPATHY EVALUATION
HEMOGLOBIN F QUANTITATION: 0 % (ref 0.0–2.0)
HGB C: 0 %
HGB S: 0 %
HGB VARIANT: 0 %
Hemoglobin A2 Quantitation: 2.7 % (ref 1.8–3.2)
Hgb A: 97.3 % (ref 96.4–98.8)

## 2018-05-07 LAB — OBSTETRIC PANEL, INCLUDING HIV
Antibody Screen: NEGATIVE
Basophils Absolute: 0 10*3/uL (ref 0.0–0.2)
Basos: 1 %
EOS (ABSOLUTE): 0.1 10*3/uL (ref 0.0–0.4)
Eos: 1 %
HEP B S AG: NEGATIVE
HIV SCREEN 4TH GENERATION: NONREACTIVE
Hematocrit: 36.3 % (ref 34.0–46.6)
Hemoglobin: 12.2 g/dL (ref 11.1–15.9)
Immature Grans (Abs): 0 10*3/uL (ref 0.0–0.1)
Immature Granulocytes: 0 %
LYMPHS ABS: 2.7 10*3/uL (ref 0.7–3.1)
Lymphs: 39 %
MCH: 31.8 pg (ref 26.6–33.0)
MCHC: 33.6 g/dL (ref 31.5–35.7)
MCV: 95 fL (ref 79–97)
Monocytes Absolute: 0.4 10*3/uL (ref 0.1–0.9)
Monocytes: 6 %
NEUTROS ABS: 3.8 10*3/uL (ref 1.4–7.0)
Neutrophils: 53 %
PLATELETS: 368 10*3/uL (ref 150–450)
RBC: 3.84 x10E6/uL (ref 3.77–5.28)
RDW: 12.3 % (ref 12.3–15.4)
RH TYPE: POSITIVE
RPR: NONREACTIVE
Rubella Antibodies, IGG: 4.05 index (ref >0.99)
WBC: 7 10*3/uL (ref 3.4–10.8)

## 2018-05-07 LAB — URINE CULTURE, OB REFLEX

## 2018-05-07 LAB — CULTURE, OB URINE

## 2018-06-01 ENCOUNTER — Encounter: Payer: Self-pay | Admitting: Obstetrics and Gynecology

## 2018-06-01 ENCOUNTER — Ambulatory Visit (INDEPENDENT_AMBULATORY_CARE_PROVIDER_SITE_OTHER): Payer: Medicaid Other | Admitting: Obstetrics and Gynecology

## 2018-06-01 VITALS — BP 118/59 | HR 68 | Wt 139.6 lb

## 2018-06-01 DIAGNOSIS — O09211 Supervision of pregnancy with history of pre-term labor, first trimester: Secondary | ICD-10-CM

## 2018-06-01 DIAGNOSIS — O09899 Supervision of other high risk pregnancies, unspecified trimester: Secondary | ICD-10-CM

## 2018-06-01 DIAGNOSIS — O09219 Supervision of pregnancy with history of pre-term labor, unspecified trimester: Secondary | ICD-10-CM

## 2018-06-01 DIAGNOSIS — Z3A14 14 weeks gestation of pregnancy: Secondary | ICD-10-CM

## 2018-06-01 DIAGNOSIS — Z3480 Encounter for supervision of other normal pregnancy, unspecified trimester: Secondary | ICD-10-CM

## 2018-06-01 DIAGNOSIS — O09891 Supervision of other high risk pregnancies, first trimester: Secondary | ICD-10-CM

## 2018-06-01 NOTE — Patient Instructions (Signed)
Considering Waterbirth? Guide for patients at [facility]  Why consider waterbirth?  . Gentle birth for babies . Less pain medicine used in labor . May allow for passive descent/less pushing . May reduce perineal tears  . More mobility and instinctive maternal position changes . Increased maternal relaxation . Reduced blood pressure in labor  Is waterbirth safe? What are the risks of infection, drowning or other complications?  . Infection: o Very low risk (3.7 % for tub vs 4.8% for bed) o 7 in 8000 waterbirths with documented infection o Poorly cleaned equipment most common cause o Slightly lower group B strep transmission rate  . Drowning o Maternal:  - Very low risk   - Related to seizures or fainting o Newborn:  - Very low risk. No evidence of increased risk of respiratory problems in multiple large studies - Physiological protection from breathing under water - Avoid underwater birth if there are any fetal complications - Once baby's head is out of the water, keep it out.  . Birth complication o Some reports of cord trauma, but risk decreased by bringing baby to surface gradually o No evidence of increased risk of shoulder dystocia. Mothers can usually change positions faster in water than in a bed, possibly aiding the maneuvers to free the shoulder.   You must attend a Waterbirth class at [facility]  3rd Wednesday of every month from 7-9pm  Free  Register by calling [phone] or online at [email or web address]  Bring us the certificate from the class to your prenatal appointment  Meet with a midwife at 36 weeks to see if you can still plan a waterbirth and to sign the consent.   Purchase or rent the following supplies: You are responsible for providing all supplies listed above. **If you do not have all necessary supplies you cannot have a waterbirth.**   Water Birth Pool (Birth Pool in a Box or [employer] for instance)  (Tubs start  ~$125)  Single-use disposable tub liner designed for your brand of tub  Electric drain pump to remove water (We recommend 792 gallon per hour or greater pump.)   New garden hose labeled "lead-free", "suitable for drinking water",  Separate garden hose to remove the dirty water  Fish net  Bathing suit top (optional)  Long-handled mirror (optional)  Places to purchase or rent supplies:   Yourwaterbirth.com for tub purchases and supplies  Waterbirthsolutions.com for tub purchases and supplies  [employer] (www.thelaborladies.com) $275 for tub rental/set-up & take down/kit   [place] [employer] (http://www.padanc.org/MeetUs.htm) Information regarding doulas (labor support) who provide pool rentals  Things that would prevent you from having a waterbirth:  Premature, <37wks  Previous cesarean birth  Presence of thick meconium-stained fluid  Multiple gestation (Twins, triplets, etc.)  Uncontrolled diabetes or gestational diabetes requiring medication  Hypertension requiring medication or diagnosis of pre-eclampsia  Heavy vaginal bleeding  Non-reassuring fetal heart rate  Active infection (MRSA, etc.). Group B Strep is NOT a contraindication for waterbirth.  If your labor has to be induced and induction method requires continuous monitoring of the baby's heart rate  Other risks/issues identified by your obstetrical provider  Please remember that birth is unpredictable. Under certain unforeseeable circumstances your provider may advise against giving birth in the tub. These decisions will be made on a case-by-case basis and with the safety of you and your baby as our highest priority.    

## 2018-06-01 NOTE — Progress Notes (Signed)
    PRENATAL VISIT NOTE  Subjective:  Marisa Stephens is a 27 y.o. [id number] at 3449w3d being seen today for ongoing prenatal care.  She is currently monitored for the following issues for this high-risk pregnancy and has History of premature delivery, currently pregnant; Short cervix during pregnancy in third trimester; UTI (urinary tract infection) during pregnancy; Supervision of other normal pregnancy, antepartum; and Short interval between pregnancies affecting pregnancy in first trimester, antepartum on their problem list.  Patient reports no complaints.  Contractions: Not present. Vag. Bleeding: None.   . Denies leaking of fluid.   The following portions of the patient's history were reviewed and updated as appropriate: allergies, current medications, past family history, past medical history, past social history, past surgical history and problem list. Problem list updated.  Objective:   Vitals:   06/01/18 0930  BP: (!) 118/59  Pulse: 68  Weight: 139 lb 9.6 oz (63.3 kg)    Fetal Status: Fetal Heart Rate (bpm): 154         General:  Alert, oriented and cooperative. Patient is in no acute distress.  Skin: Skin is warm and dry. No rash noted.   Cardiovascular: Normal heart rate noted  Respiratory: Normal respiratory effort, no problems with respiration noted  Abdomen: Soft, gravid, appropriate for gestational age.  Pain/Pressure: Absent     Pelvic: Cervical exam deferred        Extremities: Normal range of motion.  Edema: Trace  Mental Status: Normal mood and affect. Normal behavior. Normal judgment and thought content.   Assessment and Plan:  Pregnancy: [id number] at 6649w3d  1. Supervision of other normal pregnancy, antepartum Patient is doing well without complaints  2. Short interval between pregnancies affecting pregnancy in first trimester, antepartum   3. History of premature delivery, currently pregnant Patient received 17-P with last pregnancy Patient desires to  receive 17-P again  Preterm labor symptoms and general obstetric precautions including but not limited to vaginal bleeding, contractions, leaking of fluid and fetal movement were reviewed in detail with the patient. Please refer to After Visit Summary for other counseling recommendations.  Return in about 4 weeks (around 06/29/2018) for ROB.  Future Appointments  Date Time Provider Department Center  06/13/2018  8:45 AM [facility] US 2 [facility]  07/04/2018 10:15 AM [facility] US 4 [facility]    Catalina AntiguaPeggy Macari Zalesky, MD

## 2018-06-01 NOTE — Progress Notes (Signed)
Pt is here for ROB [id number] 229w3d.

## 2018-06-13 ENCOUNTER — Ambulatory Visit (HOSPITAL_COMMUNITY)
Admission: RE | Admit: 2018-06-13 | Discharge: 2018-06-13 | Disposition: A | Discharge status: Home / Self Care | Payer: Medicaid Other | Source: Ambulatory Visit | Attending: Advanced Practice Midwife | Admitting: Advanced Practice Midwife

## 2018-06-13 DIAGNOSIS — O09891 Supervision of other high risk pregnancies, first trimester: Secondary | ICD-10-CM

## 2018-06-13 DIAGNOSIS — Z3686 Encounter for antenatal screening for cervical length: Secondary | ICD-10-CM

## 2018-06-13 DIAGNOSIS — Z3A16 16 weeks gestation of pregnancy: Secondary | ICD-10-CM

## 2018-06-13 DIAGNOSIS — O09892 Supervision of other high risk pregnancies, second trimester: Secondary | ICD-10-CM

## 2018-06-13 DIAGNOSIS — O09212 Supervision of pregnancy with history of pre-term labor, second trimester: Secondary | ICD-10-CM | POA: Insufficient documentation

## 2018-06-14 ENCOUNTER — Ambulatory Visit: Payer: Medicaid Other

## 2018-06-14 VITALS — Wt 144.6 lb

## 2018-06-14 DIAGNOSIS — O09899 Supervision of other high risk pregnancies, unspecified trimester: Secondary | ICD-10-CM

## 2018-06-14 DIAGNOSIS — O09219 Supervision of pregnancy with history of pre-term labor, unspecified trimester: Principal | ICD-10-CM

## 2018-06-14 MED ORDER — HYDROXYPROGESTERONE CAPROATE 275 MG/1.1ML ~~LOC~~ SOAJ
275.0000 mg | Freq: Once | SUBCUTANEOUS | Status: AC
  Administered 2018-06-14: 275 mg via SUBCUTANEOUS

## 2018-06-14 NOTE — Progress Notes (Signed)
Pt is here for first makena injection. Pt instructed to look for signs of reactions since this is the first one. Injection given in R arm, pt tolerated well.

## 2018-06-20 ENCOUNTER — Ambulatory Visit: Payer: Medicaid Other

## 2018-06-30 ENCOUNTER — Encounter: Payer: Medicaid Other | Admitting: Obstetrics and Gynecology

## 2018-07-04 ENCOUNTER — Ambulatory Visit (HOSPITAL_COMMUNITY)
Admission: RE | Admit: 2018-07-04 | Discharge: 2018-07-04 | Disposition: A | Discharge status: Home / Self Care | Payer: Medicaid Other | Source: Ambulatory Visit | Attending: Advanced Practice Midwife | Admitting: Advanced Practice Midwife

## 2018-07-04 ENCOUNTER — Other Ambulatory Visit: Payer: Self-pay | Admitting: Advanced Practice Midwife

## 2018-07-04 DIAGNOSIS — Z3480 Encounter for supervision of other normal pregnancy, unspecified trimester: Secondary | ICD-10-CM | POA: Diagnosis not present

## 2018-07-04 DIAGNOSIS — O09212 Supervision of pregnancy with history of pre-term labor, second trimester: Secondary | ICD-10-CM | POA: Diagnosis not present

## 2018-07-04 DIAGNOSIS — Z3A19 19 weeks gestation of pregnancy: Secondary | ICD-10-CM

## 2018-07-04 DIAGNOSIS — Z363 Encounter for antenatal screening for malformations: Secondary | ICD-10-CM | POA: Diagnosis not present

## 2018-07-06 ENCOUNTER — Telehealth: Payer: Self-pay | Admitting: Obstetrics and Gynecology

## 2018-07-07 ENCOUNTER — Ambulatory Visit: Payer: Medicaid Other

## 2018-07-07 ENCOUNTER — Encounter: Payer: Self-pay | Admitting: Obstetrics & Gynecology

## 2018-07-07 ENCOUNTER — Ambulatory Visit (INDEPENDENT_AMBULATORY_CARE_PROVIDER_SITE_OTHER): Payer: Medicaid Other | Admitting: Obstetrics & Gynecology

## 2018-07-07 VITALS — BP 100/63 | HR 80 | Wt 152.8 lb

## 2018-07-07 DIAGNOSIS — Z23 Encounter for immunization: Secondary | ICD-10-CM | POA: Diagnosis not present

## 2018-07-07 DIAGNOSIS — O09899 Supervision of other high risk pregnancies, unspecified trimester: Secondary | ICD-10-CM

## 2018-07-07 DIAGNOSIS — O09212 Supervision of pregnancy with history of pre-term labor, second trimester: Secondary | ICD-10-CM

## 2018-07-07 DIAGNOSIS — O09219 Supervision of pregnancy with history of pre-term labor, unspecified trimester: Principal | ICD-10-CM

## 2018-07-07 DIAGNOSIS — Z3480 Encounter for supervision of other normal pregnancy, unspecified trimester: Secondary | ICD-10-CM

## 2018-07-07 MED ORDER — HYDROXYPROGESTERONE CAPROATE 275 MG/1.1ML ~~LOC~~ SOAJ
275.0000 mg | Freq: Once | SUBCUTANEOUS | Status: AC
  Administered 2018-07-07: 275 mg via SUBCUTANEOUS

## 2018-07-07 MED ORDER — HYDROXYPROGESTERONE CAPROATE 250 MG/ML IM OIL: 250.0000 mg | TOPICAL_OIL | Freq: Once | INTRAMUSCULAR | 4 refills | Status: DC

## 2018-07-07 NOTE — Progress Notes (Signed)
   PRENATAL VISIT NOTE  Subjective:  Marisa Stephens is a 28 y.o. [id number] at [redacted]w[redacted]d being seen today for ongoing prenatal care.  She is currently monitored for the following issues for this high-risk pregnancy and has History of premature delivery, currently pregnant; Short cervix during pregnancy in third trimester; UTI (urinary tract infection) during pregnancy; Supervision of other normal pregnancy, antepartum; and Short interval between pregnancies affecting pregnancy in first trimester, antepartum on their problem list.  Patient reports no complaints.  Contractions: Not present. Vag. Bleeding: None.  Movement: Present. Denies leaking of fluid.   The following portions of the patient's history were reviewed and updated as appropriate: allergies, current medications, past family history, past medical history, past social history, past surgical history and problem list. Problem list updated.  Objective:   Vitals:   07/07/18 1018  BP: 100/63  Pulse: 80  Weight: 152 lb 12.8 oz (69.3 kg)    Fetal Status: Fetal Heart Rate (bpm): 140   Movement: Present     General:  Alert, oriented and cooperative. Patient is in no acute distress.  Skin: Skin is warm and dry. No rash noted.   Cardiovascular: Normal heart rate noted  Respiratory: Normal respiratory effort, no problems with respiration noted  Abdomen: Soft, gravid, appropriate for gestational age.  Pain/Pressure: Absent     Pelvic: Cervical exam deferred        Extremities: Normal range of motion.  Edema: None  Mental Status: Normal mood and affect. Normal behavior. Normal judgment and thought content.   Assessment and Plan:  Pregnancy: [id number] at [redacted]w[redacted]d  1. Supervision of other normal pregnancy, antepartum [place] result reviewed  2. History of premature delivery, currently pregnant Nl cx length - AFP, Serum, Open Spina Bifida - hydroxyprogesterone caproate (MAKENA) 250 mg/mL OIL injection; Inject 1 mL (250 mg total) into the muscle  once for 1 dose.  Dispense: 1 mL; Refill: 4 - Flu Vaccine QUAD 36+ mos IM - HYDROXYprogesterone Caproate SOAJ 275 mg  Preterm labor symptoms and general obstetric precautions including but not limited to vaginal bleeding, contractions, leaking of fluid and fetal movement were reviewed in detail with the patient. Please refer to After Visit Summary for other counseling recommendations.  Return in about 4 weeks (around 08/04/2018) for 17 P weekly.  Future Appointments  Date Time Provider Department Center  07/14/2018 10:30 AM [facility] NURSE [facility] None    Scheryl Darter, MD

## 2018-07-07 NOTE — Progress Notes (Signed)
Pt presents for ROB, AFP only, 17p, and Flu vaccine.

## 2018-07-07 NOTE — Patient Instructions (Signed)

## 2018-07-09 LAB — AFP, SERUM, OPEN SPINA BIFIDA
AFP MOM: 1.32
AFP VALUE AFPOSL: 77 ng/mL
Gest. Age on Collection Date: 19.6 weeks
Maternal Age At EDD: 28.6 yr
OSBR Risk 1 IN: 9058
TEST RESULTS AFP: NEGATIVE
Weight: 152 [lb_av]

## 2018-07-14 ENCOUNTER — Ambulatory Visit: Payer: Medicaid Other

## 2018-07-21 ENCOUNTER — Ambulatory Visit: Payer: Medicaid Other

## 2018-07-28 ENCOUNTER — Ambulatory Visit: Payer: Medicaid Other

## 2018-08-04 ENCOUNTER — Encounter: Payer: Medicaid Other | Admitting: Obstetrics and Gynecology

## 2018-08-05 ENCOUNTER — Ambulatory Visit (INDEPENDENT_AMBULATORY_CARE_PROVIDER_SITE_OTHER): Payer: Medicaid Other | Admitting: Obstetrics

## 2018-08-05 ENCOUNTER — Encounter: Payer: Self-pay | Admitting: Obstetrics

## 2018-08-05 ENCOUNTER — Other Ambulatory Visit (HOSPITAL_COMMUNITY)
Admission: RE | Admit: 2018-08-05 | Discharge: 2018-08-05 | Disposition: A | Discharge status: Home / Self Care | Payer: Medicaid Other | Source: Ambulatory Visit | Attending: Obstetrics | Admitting: Obstetrics

## 2018-08-05 VITALS — BP 105/73 | HR 106 | Wt 154.5 lb

## 2018-08-05 DIAGNOSIS — O09219 Supervision of pregnancy with history of pre-term labor, unspecified trimester: Secondary | ICD-10-CM

## 2018-08-05 DIAGNOSIS — O0992 Supervision of high risk pregnancy, unspecified, second trimester: Secondary | ICD-10-CM

## 2018-08-05 DIAGNOSIS — N898 Other specified noninflammatory disorders of vagina: Secondary | ICD-10-CM | POA: Diagnosis not present

## 2018-08-05 DIAGNOSIS — O0990 Supervision of high risk pregnancy, unspecified, unspecified trimester: Secondary | ICD-10-CM

## 2018-08-05 DIAGNOSIS — B3731 Acute candidiasis of vulva and vagina: Secondary | ICD-10-CM

## 2018-08-05 DIAGNOSIS — O09212 Supervision of pregnancy with history of pre-term labor, second trimester: Secondary | ICD-10-CM

## 2018-08-05 DIAGNOSIS — B373 Candidiasis of vulva and vagina: Secondary | ICD-10-CM

## 2018-08-05 DIAGNOSIS — R300 Dysuria: Secondary | ICD-10-CM

## 2018-08-05 DIAGNOSIS — N3001 Acute cystitis with hematuria: Secondary | ICD-10-CM

## 2018-08-05 DIAGNOSIS — O09899 Supervision of other high risk pregnancies, unspecified trimester: Secondary | ICD-10-CM

## 2018-08-05 MED ORDER — TERCONAZOLE 0.4 % VA CREA: 1.0000 | TOPICAL_CREAM | Freq: Every day | VAGINAL | 0 refills | Status: DC

## 2018-08-05 MED ORDER — NITROFURANTOIN MONOHYD MACRO 100 MG PO CAPS: 100.0000 mg | ORAL_CAPSULE | Freq: Two times a day (BID) | ORAL | 2 refills | Status: DC

## 2018-08-05 NOTE — Progress Notes (Signed)
Subjective:  Marisa Stephens is a 28 y.o. [phone] at [redacted]w[redacted]d being seen today for ongoing prenatal care.  She is currently monitored for the following issues for this high-risk pregnancy and has History of premature delivery, currently pregnant; Short cervix during pregnancy in third trimester; UTI (urinary tract infection) during pregnancy; Supervision of other normal pregnancy, antepartum; and Short interval between pregnancies affecting pregnancy in first trimester, antepartum on their problem list.  Patient reports vaginal irritation and pressure and burning with urination.  Contractions: Not present. Vag. Bleeding: None.  Movement: Present. Denies leaking of fluid.   The following portions of the patient's history were reviewed and updated as appropriate: allergies, current medications, past family history, past medical history, past social history, past surgical history and problem list. Problem list updated.  Objective:   Vitals:   08/05/18 0853  BP: 105/73  Pulse: (!) 106  Weight: 154 lb 8 oz (70.1 kg)    Fetal Status: Fetal Heart Rate (bpm): 147   Movement: Present     General:  Alert, oriented and cooperative. Patient is in no acute distress.  Skin: Skin is warm and dry. No rash noted.   Cardiovascular: Normal heart rate noted  Respiratory: Normal respiratory effort, no problems with respiration noted  Abdomen: Soft, gravid, appropriate for gestational age. Pain/Pressure: Absent     Pelvic:  Cervical exam deferred        Extremities: Normal range of motion.  Edema: None  Mental Status: Normal mood and affect. Normal behavior. Normal judgment and thought content.   Urinalysis:      Assessment and Plan:  Pregnancy: [id number] at [redacted]w[redacted]d  1. Supervision of high risk pregnancy, antepartum  2. History of premature delivery, currently pregnant - weekly Makena  3. Dysuria Rx: - Urine Culture  4. Acute cystitis with hematuria Rx: - nitrofurantoin, macrocrystal-monohydrate,  (MACROBID) 100 MG capsule; Take 1 capsule (100 mg total) by mouth 2 (two) times daily. 1 po BID x 7days  Dispense: 14 capsule; Refill: 2  5. Vaginal discharge Rx: - Cervicovaginal ancillary only  6. Candida vaginitis Rx: - terconazole (TERAZOL 7) 0.4 % vaginal cream; Place 1 applicator vaginally at bedtime.  Dispense: 45 g; Refill: 0   Preterm labor symptoms and general obstetric precautions including but not limited to vaginal bleeding, contractions, leaking of fluid and fetal movement were reviewed in detail with the patient. Please refer to After Visit Summary for other counseling recommendations.  Return in about 4 weeks (around 09/02/2018) for ROB, 2 hour OGTT.   Brock Bad, MD

## 2018-08-05 NOTE — Addendum Note (Signed)
Addended by: Natale Milch D on: 08/05/2018 10:13 AM   Modules accepted: Orders

## 2018-08-05 NOTE — Progress Notes (Signed)
Patient reports good fetal movement, reports pressure and dark urine. Advised to leave a urine sample. Pt states that she does not want to continue 17-p injections because her cervix is normal.

## 2018-08-07 LAB — CERVICOVAGINAL ANCILLARY ONLY
Bacterial vaginitis: POSITIVE — AB
Candida vaginitis: POSITIVE — AB
Chlamydia: NEGATIVE
Neisseria Gonorrhea: NEGATIVE
Trichomonas: NEGATIVE

## 2018-08-08 ENCOUNTER — Other Ambulatory Visit: Payer: Self-pay | Admitting: Obstetrics

## 2018-08-08 DIAGNOSIS — N760 Acute vaginitis: Principal | ICD-10-CM

## 2018-08-08 DIAGNOSIS — B9689 Other specified bacterial agents as the cause of diseases classified elsewhere: Secondary | ICD-10-CM

## 2018-08-08 MED ORDER — METRONIDAZOLE 500 MG PO TABS: 500.0000 mg | ORAL_TABLET | Freq: Two times a day (BID) | ORAL | 2 refills | Status: DC

## 2018-08-09 LAB — CULTURE, OB URINE

## 2018-08-09 LAB — URINE CULTURE, OB REFLEX

## 2018-08-10 ENCOUNTER — Other Ambulatory Visit: Payer: Self-pay | Admitting: *Deleted

## 2018-08-10 ENCOUNTER — Other Ambulatory Visit: Payer: Self-pay | Admitting: Obstetrics

## 2018-08-10 DIAGNOSIS — B3731 Acute candidiasis of vulva and vagina: Secondary | ICD-10-CM

## 2018-08-10 DIAGNOSIS — B373 Candidiasis of vulva and vagina: Secondary | ICD-10-CM

## 2018-08-10 DIAGNOSIS — O2340 Unspecified infection of urinary tract in pregnancy, unspecified trimester: Secondary | ICD-10-CM

## 2018-08-10 MED ORDER — TERCONAZOLE 0.4 % VA CREA: 1.0000 | TOPICAL_CREAM | Freq: Every day | VAGINAL | 0 refills | Status: DC

## 2018-08-10 MED ORDER — CEFUROXIME AXETIL 500 MG PO TABS: 500.0000 mg | ORAL_TABLET | Freq: Two times a day (BID) | ORAL | 0 refills | Status: DC
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
## 2018-09-02 ENCOUNTER — Other Ambulatory Visit: Payer: Medicaid Other

## 2018-09-02 ENCOUNTER — Ambulatory Visit (INDEPENDENT_AMBULATORY_CARE_PROVIDER_SITE_OTHER): Payer: Medicaid Other | Admitting: Obstetrics & Gynecology

## 2018-09-02 ENCOUNTER — Encounter: Payer: Self-pay | Admitting: Obstetrics & Gynecology

## 2018-09-02 VITALS — BP 108/67 | HR 105 | Wt 160.0 lb

## 2018-09-02 DIAGNOSIS — O0990 Supervision of high risk pregnancy, unspecified, unspecified trimester: Secondary | ICD-10-CM

## 2018-09-02 DIAGNOSIS — Z3482 Encounter for supervision of other normal pregnancy, second trimester: Secondary | ICD-10-CM

## 2018-09-02 DIAGNOSIS — Z3480 Encounter for supervision of other normal pregnancy, unspecified trimester: Secondary | ICD-10-CM

## 2018-09-02 DIAGNOSIS — Z3A27 27 weeks gestation of pregnancy: Secondary | ICD-10-CM

## 2018-09-02 NOTE — Patient Instructions (Signed)

## 2018-09-02 NOTE — Progress Notes (Signed)
   PRENATAL VISIT NOTE  Subjective:  Marisa Stephens is a 28 y.o. [phone] at [redacted]w[redacted]d being seen today for ongoing prenatal care.  She is currently monitored for the following issues for this low-risk pregnancy and has History of premature delivery, currently pregnant; Supervision of other normal pregnancy, antepartum; and Short interval between pregnancies affecting pregnancy in first trimester, antepartum on their problem list.  Patient reports pressure.  Contractions: Not present. Vag. Bleeding: None.  Movement: Present. Denies leaking of fluid.   The following portions of the patient's history were reviewed and updated as appropriate: allergies, current medications, past family history, past medical history, past social history, past surgical history and problem list. Problem list updated.  Objective:   Vitals:   09/02/18 0814  BP: 108/67  Pulse: (!) 105  Weight: 160 lb (72.6 kg)    Fetal Status:     Movement: Present     General:  Alert, oriented and cooperative. Patient is in no acute distress.  Skin: Skin is warm and dry. No rash noted.   Cardiovascular: Normal heart rate noted  Respiratory: Normal respiratory effort, no problems with respiration noted  Abdomen: Soft, gravid, appropriate for gestational age.  Pain/Pressure: Absent     Pelvic: Cervical exam deferred        Extremities: Normal range of motion.  Edema: None  Mental Status: Normal mood and affect. Normal behavior. Normal judgment and thought content.   Assessment and Plan:  Pregnancy: [id number] at [redacted]w[redacted]d  1. Supervision of other normal pregnancy, antepartum 2hr GTT   Preterm labor symptoms and general obstetric precautions including but not limited to vaginal bleeding, contractions, leaking of fluid and fetal movement were reviewed in detail with the patient. Please refer to After Visit Summary for other counseling recommendations.  Return in about 2 weeks (around 09/16/2018).  No future appointments.  Scheryl Darter, MD

## 2018-09-03 LAB — CBC
HEMATOCRIT: 31.3 % — AB (ref 34.0–46.6)
Hemoglobin: 10.4 g/dL — ABNORMAL LOW (ref 11.1–15.9)
MCH: 31.3 pg (ref 26.6–33.0)
MCHC: 33.2 g/dL (ref 31.5–35.7)
MCV: 94 fL (ref 79–97)
Platelets: 235 10*3/uL (ref 150–450)
RBC: 3.32 x10E6/uL — ABNORMAL LOW (ref 3.77–5.28)
RDW: 12.7 % (ref 11.7–15.4)
WBC: 7.7 10*3/uL (ref 3.4–10.8)

## 2018-09-03 LAB — GLUCOSE TOLERANCE, 2 HOURS W/ 1HR
Glucose, 1 hour: 112 mg/dL (ref 65–179)
Glucose, 2 hour: 99 mg/dL (ref 65–152)
Glucose, Fasting: 85 mg/dL (ref 65–91)

## 2018-09-03 LAB — RPR: RPR Ser Ql: NONREACTIVE

## 2018-09-03 LAB — HIV ANTIBODY (ROUTINE TESTING W REFLEX): HIV SCREEN 4TH GENERATION: NONREACTIVE

## 2018-09-15 ENCOUNTER — Encounter: Payer: Self-pay | Admitting: Obstetrics and Gynecology

## 2018-09-15 ENCOUNTER — Ambulatory Visit (INDEPENDENT_AMBULATORY_CARE_PROVIDER_SITE_OTHER): Payer: Medicaid Other | Admitting: Obstetrics and Gynecology

## 2018-09-15 ENCOUNTER — Other Ambulatory Visit: Payer: Self-pay

## 2018-09-15 VITALS — BP 104/67 | HR 103 | Wt 158.6 lb

## 2018-09-15 DIAGNOSIS — O09891 Supervision of other high risk pregnancies, first trimester: Secondary | ICD-10-CM

## 2018-09-15 DIAGNOSIS — O09219 Supervision of pregnancy with history of pre-term labor, unspecified trimester: Secondary | ICD-10-CM

## 2018-09-15 DIAGNOSIS — Z3A29 29 weeks gestation of pregnancy: Secondary | ICD-10-CM

## 2018-09-15 DIAGNOSIS — O09899 Supervision of other high risk pregnancies, unspecified trimester: Secondary | ICD-10-CM

## 2018-09-15 DIAGNOSIS — Z3480 Encounter for supervision of other normal pregnancy, unspecified trimester: Secondary | ICD-10-CM

## 2018-09-15 NOTE — Progress Notes (Signed)
   PRENATAL VISIT NOTE  Subjective:  Marisa Stephens is a 28 y.o. [id number] at [redacted]w[redacted]d being seen today for ongoing prenatal care.  She is currently monitored for the following issues for this high-risk pregnancy and has History of premature delivery, currently pregnant; Supervision of other normal pregnancy, antepartum; and Short interval between pregnancies affecting pregnancy in first trimester, antepartum on their problem list.  Patient reports no complaints.  Contractions: Irregular. Vag. Bleeding: None.  Movement: Present. Denies leaking of fluid.   The following portions of the patient's history were reviewed and updated as appropriate: allergies, current medications, past family history, past medical history, past social history, past surgical history and problem list.   Objective:   Vitals:   09/15/18 0956  BP: 104/67  Pulse: (!) 103  Weight: 158 lb 9.6 oz (71.9 kg)    Fetal Status: Fetal Heart Rate (bpm): 155 Fundal Height: 30 cm Movement: Present     General:  Alert, oriented and cooperative. Patient is in no acute distress.  Skin: Skin is warm and dry. No rash noted.   Cardiovascular: Normal heart rate noted  Respiratory: Normal respiratory effort, no problems with respiration noted  Abdomen: Soft, gravid, appropriate for gestational age.  Pain/Pressure: Present     Pelvic: Cervical exam deferred        Extremities: Normal range of motion.  Edema: None  Mental Status: Normal mood and affect. Normal behavior. Normal judgment and thought content.   Assessment and Plan:  Pregnancy: [id number] at [redacted]w[redacted]d 1. Supervision of other normal pregnancy, antepartum Patient is doing well without complaints Patient plans to use Nexplanon for contraception  2. Short interval between pregnancies affecting pregnancy in first trimester, antepartum   3. History of premature delivery, currently pregnant Patient discontinued weekly 17-P as she was not able to come in weekly She is without  complaints  Preterm labor symptoms and general obstetric precautions including but not limited to vaginal bleeding, contractions, leaking of fluid and fetal movement were reviewed in detail with the patient. Please refer to After Visit Summary for other counseling recommendations.   Return in about 2 weeks (around 09/29/2018) for ROB.  Future Appointments  Date Time Provider Department Center  09/29/2018  9:30 AM Conan Bowens, MD [facility] None    Catalina Antigua, MD

## 2018-09-29 ENCOUNTER — Encounter: Payer: Self-pay | Admitting: Obstetrics and Gynecology

## 2018-09-29 ENCOUNTER — Other Ambulatory Visit: Payer: Self-pay

## 2018-09-29 ENCOUNTER — Ambulatory Visit (INDEPENDENT_AMBULATORY_CARE_PROVIDER_SITE_OTHER): Payer: Medicaid Other | Admitting: Obstetrics and Gynecology

## 2018-09-29 VITALS — BP 112/70 | HR 99 | Wt 160.0 lb

## 2018-09-29 DIAGNOSIS — O09899 Supervision of other high risk pregnancies, unspecified trimester: Secondary | ICD-10-CM

## 2018-09-29 DIAGNOSIS — O09213 Supervision of pregnancy with history of pre-term labor, third trimester: Secondary | ICD-10-CM

## 2018-09-29 DIAGNOSIS — Z3A31 31 weeks gestation of pregnancy: Secondary | ICD-10-CM

## 2018-09-29 DIAGNOSIS — Z23 Encounter for immunization: Secondary | ICD-10-CM | POA: Diagnosis not present

## 2018-09-29 DIAGNOSIS — O09891 Supervision of other high risk pregnancies, first trimester: Secondary | ICD-10-CM

## 2018-09-29 DIAGNOSIS — Z3480 Encounter for supervision of other normal pregnancy, unspecified trimester: Secondary | ICD-10-CM

## 2018-09-29 DIAGNOSIS — O09219 Supervision of pregnancy with history of pre-term labor, unspecified trimester: Secondary | ICD-10-CM

## 2018-09-29 DIAGNOSIS — O09893 Supervision of other high risk pregnancies, third trimester: Secondary | ICD-10-CM

## 2018-09-29 NOTE — Progress Notes (Signed)
   PRENATAL VISIT NOTE  Subjective:  Marisa Stephens is a 28 y.o. [phone] at [redacted]w[redacted]d being seen today for ongoing prenatal care.  She is currently monitored for the following issues for this high-risk pregnancy and has History of premature delivery, currently pregnant; Supervision of other normal pregnancy, antepartum; and Short interval between pregnancies affecting pregnancy in first trimester, antepartum on their problem list.  Patient reports occasional contractions. Happened after she was moving furniture.  Contractions: Irregular. Vag. Bleeding: None.  Movement: Present. Denies leaking of fluid.   The following portions of the patient's history were reviewed and updated as appropriate: allergies, current medications, past family history, past medical history, past social history, past surgical history and problem list.   Objective:   Vitals:   09/29/18 0926  BP: 112/70  Pulse: 99  Weight: 160 lb (72.6 kg)    Fetal Status: Fetal Heart Rate (bpm): 145   Movement: Present     General:  Alert, oriented and cooperative. Patient is in no acute distress.  Skin: Skin is warm and dry. No rash noted.   Cardiovascular: Normal heart rate noted  Respiratory: Normal respiratory effort, no problems with respiration noted  Abdomen: Soft, gravid, appropriate for gestational age.  Pain/Pressure: Present     Pelvic: Cervical exam deferred        Extremities: Normal range of motion.     Mental Status: Normal mood and affect. Normal behavior. Normal judgment and thought content.   Assessment and Plan:  Pregnancy: [id number] at [redacted]w[redacted]d 1. Supervision of other normal pregnancy, antepartum - Tdap today - Will give cuff and enroll in babyscripts  2. History of premature delivery, currently pregnant - Discontinued 17P due to pt not able to come in weekly  3. Short interval between pregnancies affecting pregnancy in first trimester, antepartum   Preterm labor symptoms and general obstetric  precautions including but not limited to vaginal bleeding, contractions, leaking of fluid and fetal movement were reviewed in detail with the patient. Please refer to After Visit Summary for other counseling recommendations.   Return in about 2 weeks (around 10/13/2018).  No future appointments.  Conan Bowens, MD

## 2018-09-29 NOTE — Patient Instructions (Signed)
HOW TO TAKE YOUR BLOOD PRESSURE AT HOME ° °Take your blood pressure at home. Pick one day a week and take your blood pressure consistently every week on this day. Relax quietly for about 15 minutes then take your blood pressure. DO NOT take it when you are upset, stressed, or have just been active. Make sure your blood pressure cuff fits appropriately, there should be measurements on the box and on the cuff to help guide you.  ° °Once you have taken your blood pressure, look at the numbers. If the top number (systolic) is equal to or above 140, please call the office and let us know. If the bottom number (diastolic) is equal to or above 90, please call the office and let us know.  ° °If you have any questions or are concerned you are not taking your blood pressure correctly, please call the office! ° °If you have a headache that does not improve, problems with your vision, abdominal pain or other concerns, please call and let us know. Please go to the hospital with any severe symptoms. Please go to the hospital for labor, painful contractions every 5 minutes or less, leaking of fluid, or if you have not felt normal fetal movement.  ° °

## 2018-10-07 IMAGING — US US MFM OB FOLLOW-UP
1 series · 14 of 28 positions shown · non-contrast
Comparison: none

[Series 1: us mfm ob follow-up · 14 of 69 slices shown]
[im 3/69]
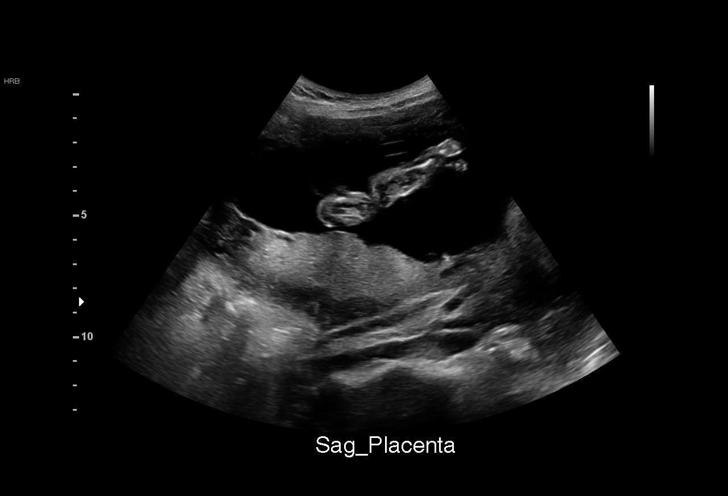
[im 8/69]
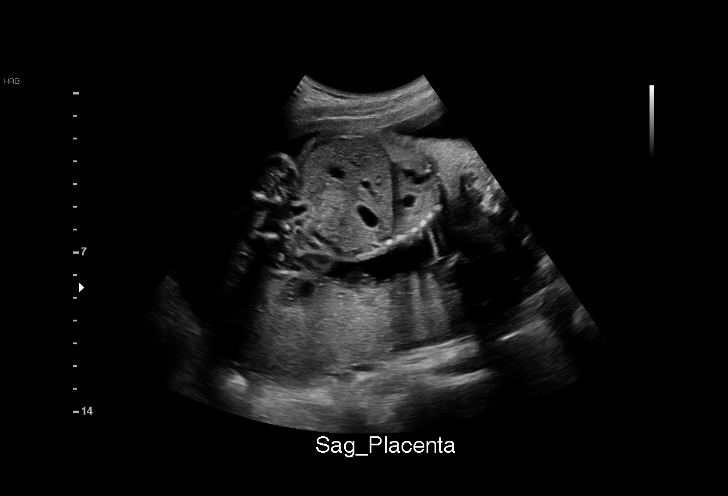
[im 13/69]
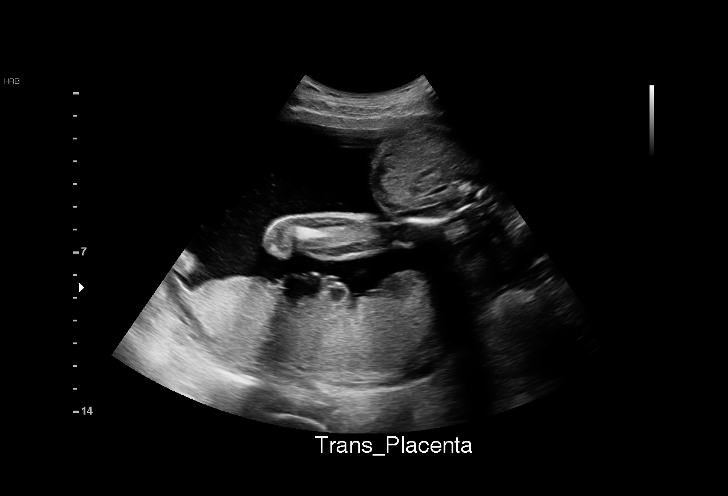
[im 18/69]
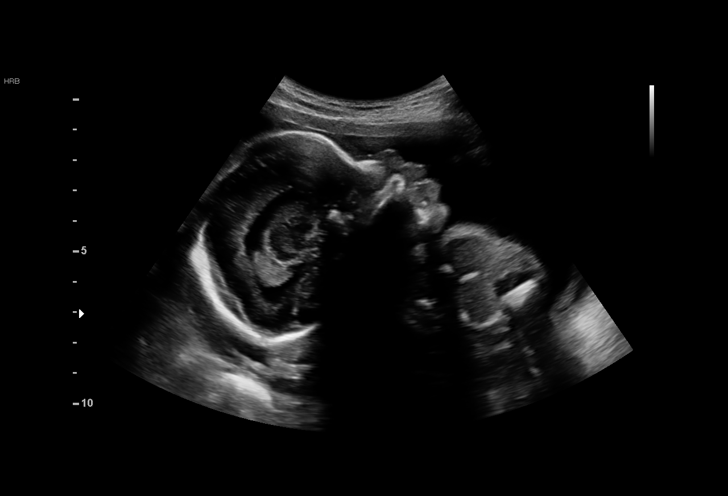
[im 23/69]
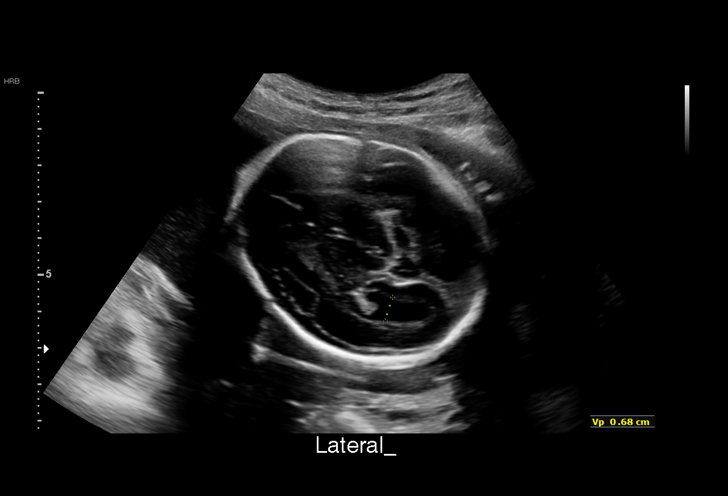
[im 28/69]
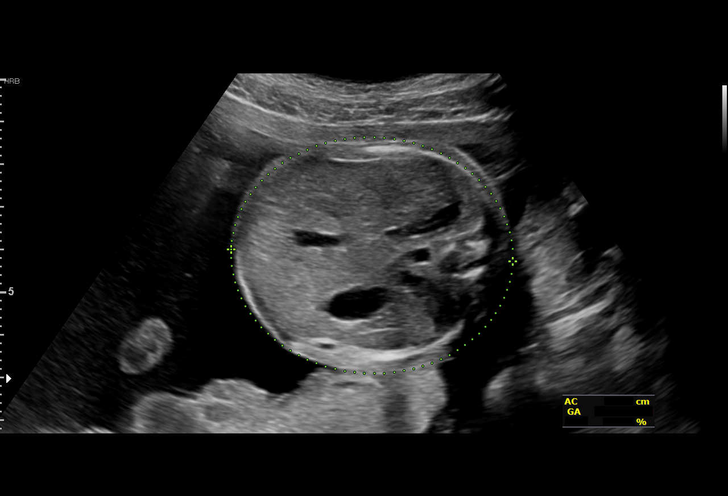
[im 33/69]
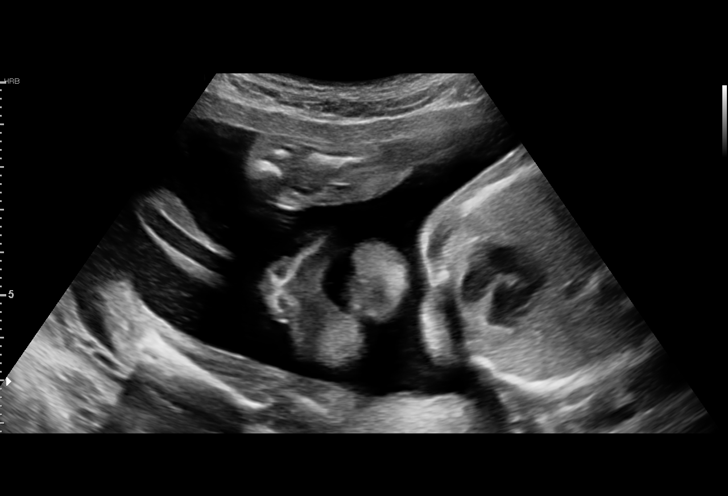
[im 38/69]
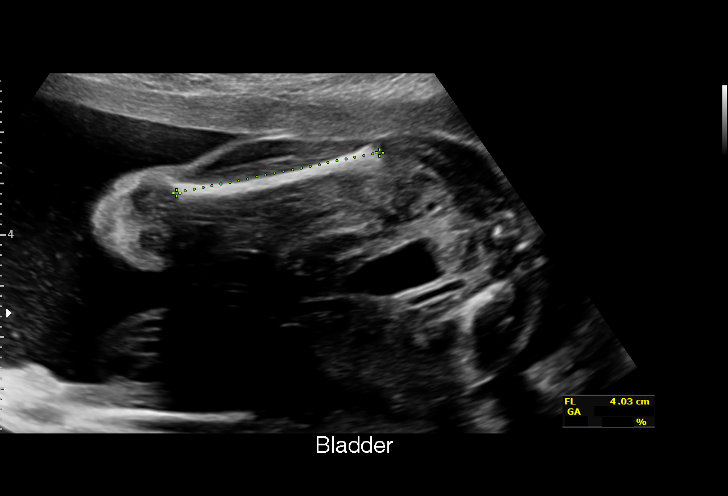
[im 43/69]
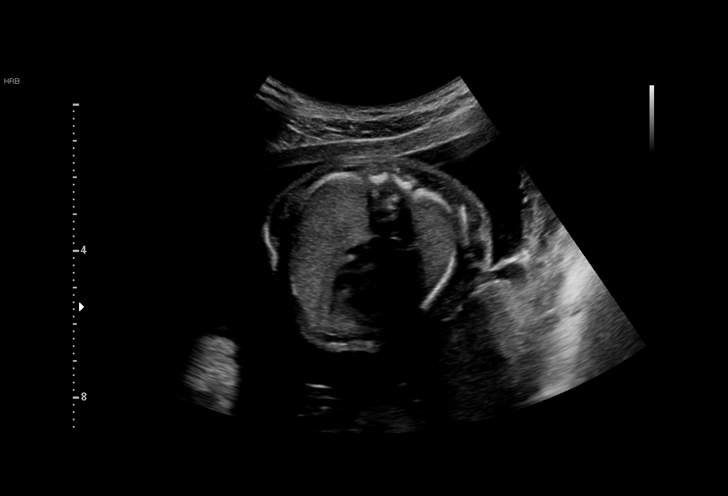
[im 48/69]
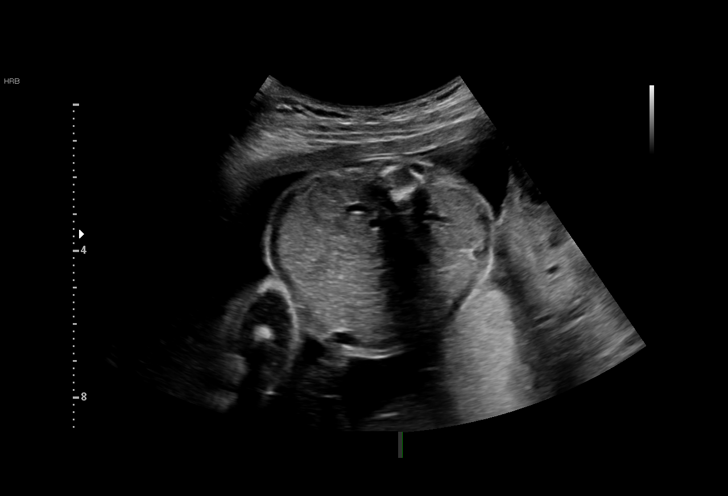
[im 53/69]
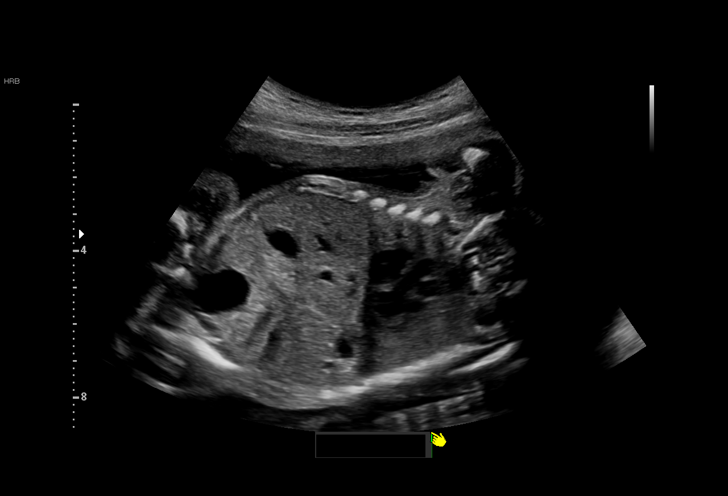
[im 58/69]
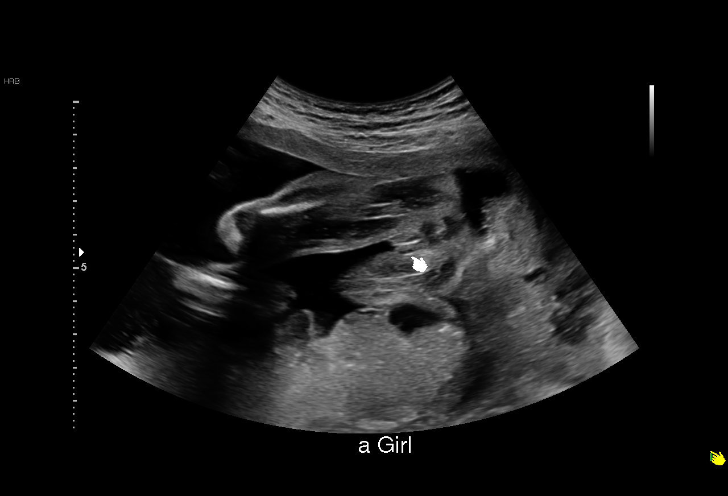
[im 63/69]
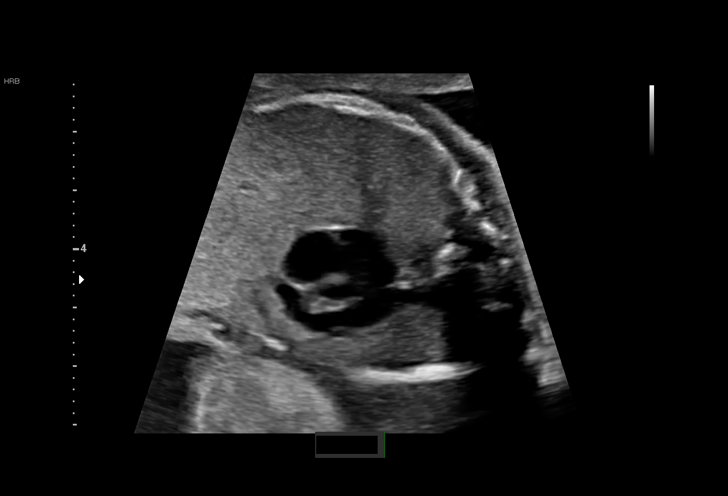
[im 69/69]
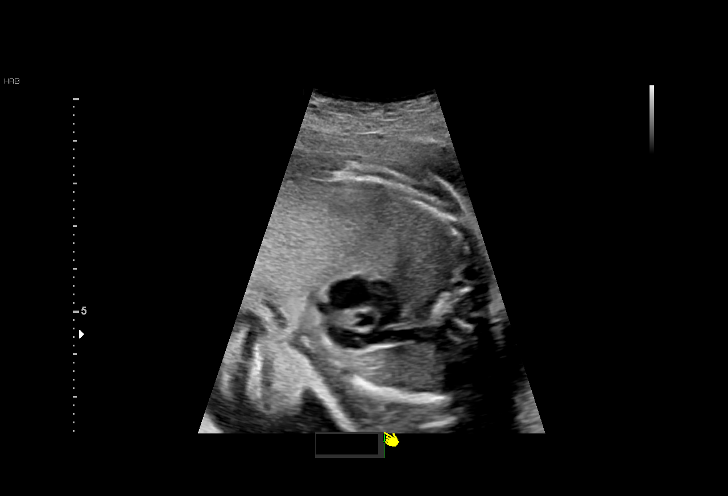

[14 of 28 positions shown; findings below may reference images not displayed]

OB/Gyn Clinic

1  AKIA SAXE             [id number]      [id number]     [id number]
Indications

23 weeks gestation of pregnancy
Poor obstetric history: Previous preterm
delivery, antepartum (35 [address])
Antenatal follow-up for nonvisualized fetal
anatomy
OB History

Gravidity:    6         Term:   3        Prem:   1        SAB:   1
TOP:          0       Ectopic:  0        Living: 4
Fetal Evaluation

Num Of Fetuses:     1
Fetal Heart         150
Rate(bpm):
Cardiac Activity:   Observed
Presentation:       Cephalic
Placenta:           Posterior, above cervical os
P. Cord Insertion:  Previously Visualized

Amniotic Fluid
AFI FV:      Subjectively within normal limits

Largest Pocket(cm)
6.12
Biometry

BPD:      57.8  mm     G. Age:  23w 5d         49  %    CI:        74.96   %    70 - 86
FL/HC:      18.8   %    18.7 -
HC:      211.8  mm     G. Age:  23w 2d         23  %    HC/AC:      1.08        1.05 -
AC:      196.2  mm     G. Age:  24w 2d         64  %    FL/BPD:     69.0   %    71 - 87
FL:       39.9  mm     G. Age:  22w 6d         19  %    FL/AC:      20.3   %    20 - 24
CER:      25.1  mm     G. Age:  23w 1d         41  %

Est. FW:     614  gm      1 lb 6 oz     54  %
Gestational Age

LMP:           26w 5d        Date:  07/05/16                 EDD:   04/11/17
U/S Today:     23w 4d                                        EDD:   05/03/17
Best:          23w 4d     Det. By:  U/S C R L  (10/28/16)    EDD:   05/03/17
Anatomy

Cranium:               Appears normal         Aortic Arch:            Previously seen
Cavum:                 Appears normal         Ductal Arch:            Previously seen
Ventricles:            Appears normal         Diaphragm:              Appears normal
Choroid Plexus:        Previously seen        Stomach:                Appears normal, left
sided
Cerebellum:            Appears normal         Abdomen:                Appears normal
Posterior Fossa:       Appears normal         Abdominal Wall:         Appears nml (cord
insert, abd wall)
Nuchal Fold:           Previously seen        Cord Vessels:           Previously seen
Face:                  Orbits and profile     Kidneys:                Appear normal
previously seen
Lips:                  Appears normal         Bladder:                Appears normal
Thoracic:              Appears normal         Spine:                  Appears normal
Heart:                 Appears normal         Upper Extremities:      Previously seen
(4CH, axis, and situs
RVOT:                  Appears normal         Lower Extremities:      Previously seen
LVOT:                  Appears normal

Other:  Fetus appears to be a female. Heels and 5th digit previously
visualized.
Impression

Single IUP at 23w 4d
Normal interval anatomy- the anatomic survey is now
complete
Fetal growth is appropriate (54th %tile)
Posterior placenta without previa
Normal amniotic fluid volume
Recommendations

Follow-up ultrasounds as clinically indicated.

## 2018-10-13 ENCOUNTER — Other Ambulatory Visit: Payer: Self-pay

## 2018-10-13 ENCOUNTER — Ambulatory Visit (INDEPENDENT_AMBULATORY_CARE_PROVIDER_SITE_OTHER): Payer: Medicaid Other | Admitting: Obstetrics & Gynecology

## 2018-10-13 DIAGNOSIS — O09219 Supervision of pregnancy with history of pre-term labor, unspecified trimester: Principal | ICD-10-CM

## 2018-10-13 DIAGNOSIS — Z3A33 33 weeks gestation of pregnancy: Secondary | ICD-10-CM

## 2018-10-13 DIAGNOSIS — O09899 Supervision of other high risk pregnancies, unspecified trimester: Secondary | ICD-10-CM

## 2018-10-13 DIAGNOSIS — O09213 Supervision of pregnancy with history of pre-term labor, third trimester: Secondary | ICD-10-CM

## 2018-10-13 DIAGNOSIS — Z3480 Encounter for supervision of other normal pregnancy, unspecified trimester: Secondary | ICD-10-CM

## 2018-10-13 NOTE — Patient Instructions (Signed)
Preterm Labor and Birth Information °Pregnancy normally lasts 39-41 weeks. Preterm labor is when labor starts early. It starts before you have been pregnant for 37 whole weeks. °What are the risk factors for preterm labor? °Preterm labor is more likely to occur in women who: °· Have an infection while pregnant. °· Have a cervix that is short. °· Have gone into preterm labor before. °· Have had surgery on their cervix. °· Are younger than age 29. °· Are older than age 35. °· Are African American. °· Are pregnant with two or more babies. °· Take street drugs while pregnant. °· Smoke while pregnant. °· Do not gain enough weight while pregnant. °· Got pregnant right after another pregnancy. °What are the symptoms of preterm labor? °Symptoms of preterm labor include: °· Cramps. The cramps may feel like the cramps some women get during their period. The cramps may happen with watery poop (diarrhea). °· Pain in the belly (abdomen). °· Pain in the lower back. °· Regular contractions or tightening. It may feel like your belly is getting tighter. °· Pressure in the lower belly that seems to get stronger. °· More fluid (discharge) leaking from the vagina. The fluid may be watery or bloody. °· Water breaking. °Why is it important to notice signs of preterm labor? °Babies who are born early may not be fully developed. They have a higher chance for: °· Long-term heart problems. °· Long-term lung problems. °· Trouble controlling body systems, like breathing. °· Bleeding in the brain. °· A condition called cerebral palsy. °· Learning difficulties. °· Death. °These risks are highest for babies who are born before 34 weeks of pregnancy. °How is preterm labor treated? °Treatment depends on: °· How long you were pregnant. °· Your condition. °· The health of your baby. °Treatment may involve: °· Having a stitch (suture) placed in your cervix. When you give birth, your cervix opens so the baby can come out. The stitch keeps the cervix  from opening too soon. °· Staying at the hospital. °· Taking or getting medicines, such as: °? Hormone medicines. °? Medicines to stop contractions. °? Medicines to help the baby’s lungs develop. °? Medicines to prevent your baby from having cerebral palsy. °What should I do if I am in preterm labor? °If you think you are going into labor too soon, call your doctor right away. °How can I prevent preterm labor? °· Do not use any tobacco products. °? Examples of these are cigarettes, chewing tobacco, and e-cigarettes. °? If you need help quitting, ask your doctor. °· Do not use street drugs. °· Do not use any medicines unless you ask your doctor if they are safe for you. °· Talk with your doctor before taking any herbal supplements. °· Make sure you gain enough weight. °· Watch for infection. If you think you might have an infection, get it checked right away. °· If you have gone into preterm labor before, tell your doctor. °This information is not intended to replace advice given to you by your health care provider. Make sure you discuss any questions you have with your health care provider. °Document Released: 09/11/2008 Document Revised: 11/26/2015 Document Reviewed: 11/06/2015 °Elsevier Interactive Patient Education © 2019 [employer] Inc[employer] ° °

## 2018-10-13 NOTE — Progress Notes (Signed)
    TELEHEALTH VIRTUAL OBSTETRICS VISIT ENCOUNTER NOTE  I connected with Marisa Stephens on 10/13/18 at  9:15 AM EDT by telephone at home and verified that I am speaking with the correct person using two identifiers.   I discussed the limitations, risks, security and privacy concerns of performing an evaluation and management service by telephone and the availability of in person appointments. I also discussed with the patient that there may be a patient responsible charge related to this service. The patient expressed understanding and agreed to proceed.  Subjective:  Marisa Stephens is a 28 y.o. [phone] at [redacted]w[redacted]d being followed for ongoing prenatal care.  She is currently monitored for the following issues for this high-risk pregnancy and has History of premature delivery, currently pregnant; Supervision of other normal pregnancy, antepartum; and Short interval between pregnancies affecting pregnancy in first trimester, antepartum on their problem list.  Patient reports occasional contractions. Reports fetal movement. Denies any contractions, bleeding or leaking of fluid.   The following portions of the patient's history were reviewed and updated as appropriate: allergies, current medications, past family history, past medical history, past social history, past surgical history and problem list.   Objective:   General:  Alert, oriented and cooperative.   Mental Status: Normal mood and affect perceived. Normal judgment and thought content.  Rest of physical exam deferred due to type of encounter  Assessment and Plan:  Pregnancy: [id number] at [redacted]w[redacted]d 1. Supervision of other normal pregnancy, antepartum Doing well  2. History of premature delivery, currently pregnant Not using Mareka  Preterm labor symptoms and general obstetric precautions including but not limited to vaginal bleeding, contractions, leaking of fluid and fetal movement were reviewed in detail with the patient.  I  discussed the assessment and treatment plan with the patient. The patient was provided an opportunity to ask questions and all were answered. The patient agreed with the plan and demonstrated an understanding of the instructions. The patient was advised to call back or seek an in-person office evaluation/go to MAU at [facility] for any urgent or concerning symptoms. Please refer to After Visit Summary for other counseling recommendations.   I provided 10 minutes of non-face-to-face time during this encounter.  No follow-ups on file.  No future appointments.  Scheryl Darter, MD [facility] for [facility], [facility]

## 2018-10-13 NOTE — Progress Notes (Signed)
CC: : Pressure onset last night.

## 2018-10-22 ENCOUNTER — Other Ambulatory Visit: Payer: Self-pay

## 2018-10-22 ENCOUNTER — Inpatient Hospital Stay (HOSPITAL_COMMUNITY)
Admission: AD | Admit: 2018-10-22 | Discharge: 2018-10-23 | DRG: 833 | Disposition: A | Discharge status: Home / Self Care | Payer: Medicaid Other | Attending: Obstetrics & Gynecology | Admitting: Obstetrics & Gynecology

## 2018-10-22 ENCOUNTER — Encounter (HOSPITAL_COMMUNITY): Payer: Self-pay

## 2018-10-22 DIAGNOSIS — W19XXXA Unspecified fall, initial encounter: Secondary | ICD-10-CM | POA: Diagnosis present

## 2018-10-22 DIAGNOSIS — Z3480 Encounter for supervision of other normal pregnancy, unspecified trimester: Secondary | ICD-10-CM

## 2018-10-22 DIAGNOSIS — Z87891 Personal history of nicotine dependence: Secondary | ICD-10-CM

## 2018-10-22 DIAGNOSIS — O09219 Supervision of pregnancy with history of pre-term labor, unspecified trimester: Secondary | ICD-10-CM

## 2018-10-22 DIAGNOSIS — O09899 Supervision of other high risk pregnancies, unspecified trimester: Secondary | ICD-10-CM

## 2018-10-22 DIAGNOSIS — O4700 False labor before 37 completed weeks of gestation, unspecified trimester: Secondary | ICD-10-CM | POA: Diagnosis present

## 2018-10-22 DIAGNOSIS — O09891 Supervision of other high risk pregnancies, first trimester: Secondary | ICD-10-CM

## 2018-10-22 DIAGNOSIS — W108XXA Fall (on) (from) other stairs and steps, initial encounter: Secondary | ICD-10-CM | POA: Diagnosis present

## 2018-10-22 DIAGNOSIS — Z3A35 35 weeks gestation of pregnancy: Secondary | ICD-10-CM

## 2018-10-22 LAB — URINALYSIS, ROUTINE W REFLEX MICROSCOPIC
Bilirubin Urine: NEGATIVE
Glucose, UA: NEGATIVE mg/dL
Hgb urine dipstick: NEGATIVE
Ketones, ur: NEGATIVE mg/dL
Nitrite: NEGATIVE
Protein, ur: NEGATIVE mg/dL
Specific Gravity, Urine: 1.011 (ref 1.005–1.030)
pH: 7 (ref 5.0–8.0)

## 2018-10-22 MED ORDER — CYCLOBENZAPRINE HCL 10 MG PO TABS
10.0000 mg | ORAL_TABLET | Freq: Once | ORAL | Status: AC
  Administered 2018-10-22: 10 mg via ORAL
  Filled 2018-10-22: qty 1

## 2018-10-22 NOTE — ED Notes (Signed)
Spoke to MAU about patient checking in.  Told to send her there.  Waiting for transport.

## 2018-10-22 NOTE — MAU Note (Addendum)
Pt came over from [facility] ED. Was coming down steps and fell. Came down steps on buttocks and also hit lower back. Fall happened Friday night. Was sore this am and started feeling better but family members insisted I come. Denies vag bleeding or LOF. Good FM. Occ ctxs

## 2018-10-22 NOTE — MAU Note (Signed)
Provider aware of blood pressures 130s/80s; pt asymptomatic.

## 2018-10-23 ENCOUNTER — Encounter (HOSPITAL_COMMUNITY): Payer: Self-pay

## 2018-10-23 DIAGNOSIS — O09213 Supervision of pregnancy with history of pre-term labor, third trimester: Secondary | ICD-10-CM | POA: Diagnosis not present

## 2018-10-23 DIAGNOSIS — O4703 False labor before 37 completed weeks of gestation, third trimester: Secondary | ICD-10-CM

## 2018-10-23 DIAGNOSIS — O26893 Other specified pregnancy related conditions, third trimester: Secondary | ICD-10-CM | POA: Diagnosis present

## 2018-10-23 DIAGNOSIS — W19XXXA Unspecified fall, initial encounter: Secondary | ICD-10-CM | POA: Diagnosis present

## 2018-10-23 DIAGNOSIS — Z3A35 35 weeks gestation of pregnancy: Secondary | ICD-10-CM

## 2018-10-23 DIAGNOSIS — Z87891 Personal history of nicotine dependence: Secondary | ICD-10-CM | POA: Diagnosis not present

## 2018-10-23 DIAGNOSIS — W108XXA Fall (on) (from) other stairs and steps, initial encounter: Secondary | ICD-10-CM | POA: Diagnosis present

## 2018-10-23 DIAGNOSIS — O09893 Supervision of other high risk pregnancies, third trimester: Secondary | ICD-10-CM | POA: Diagnosis not present

## 2018-10-23 DIAGNOSIS — O4700 False labor before 37 completed weeks of gestation, unspecified trimester: Secondary | ICD-10-CM | POA: Diagnosis present

## 2018-10-23 LAB — CBC
HCT: 32 % — ABNORMAL LOW (ref 36.0–46.0)
Hemoglobin: 10.7 g/dL — ABNORMAL LOW (ref 12.0–15.0)
MCH: 30.9 pg (ref 26.0–34.0)
MCHC: 33.4 g/dL (ref 30.0–36.0)
MCV: 92.5 fL (ref 80.0–100.0)
Platelets: 188 10*3/uL (ref 150–400)
RBC: 3.46 MIL/uL — ABNORMAL LOW (ref 3.87–5.11)
RDW: 13.5 % (ref 11.5–15.5)
WBC: 7.9 10*3/uL (ref 4.0–10.5)
nRBC: 0 % (ref 0.0–0.2)

## 2018-10-23 LAB — TYPE AND SCREEN
ABO/RH(D): O POS
Antibody Screen: NEGATIVE

## 2018-10-23 LAB — RPR: RPR Ser Ql: NONREACTIVE

## 2018-10-23 MED ORDER — SODIUM CHLORIDE 0.9 % IV SOLN
5.0000 10*6.[IU] | Freq: Once | INTRAVENOUS | Status: AC
  Administered 2018-10-23: 5 10*6.[IU] via INTRAVENOUS
  Filled 2018-10-23: qty 5

## 2018-10-23 MED ORDER — OXYTOCIN BOLUS FROM INFUSION: 500.0000 mL | Freq: Once | INTRAVENOUS | Status: DC

## 2018-10-23 MED ORDER — LACTATED RINGERS IV SOLN
INTRAVENOUS | Status: DC
  Administered 2018-10-23 (×2): via INTRAVENOUS

## 2018-10-23 MED ORDER — LACTATED RINGERS IV SOLN: 500.0000 mL | INTRAVENOUS | Status: DC | PRN

## 2018-10-23 MED ORDER — LACTATED RINGERS IV BOLUS: 1000.0000 mL | Freq: Once | INTRAVENOUS | Status: DC

## 2018-10-23 MED ORDER — PENICILLIN G 3 MILLION UNITS IVPB - SIMPLE MED
3.0000 10*6.[IU] | INTRAVENOUS | Status: DC
  Administered 2018-10-23: 3 10*6.[IU] via INTRAVENOUS
  Filled 2018-10-23 (×2): qty 100

## 2018-10-23 MED ORDER — FENTANYL CITRATE (PF) 100 MCG/2ML IJ SOLN
100.0000 ug | INTRAMUSCULAR | Status: DC | PRN
  Administered 2018-10-23: 100 ug via INTRAVENOUS
  Filled 2018-10-23: qty 2

## 2018-10-23 MED ORDER — FLEET ENEMA 7-19 GM/118ML RE ENEM: 1.0000 | ENEMA | RECTAL | Status: DC | PRN

## 2018-10-23 MED ORDER — OXYTOCIN 40 UNITS IN NORMAL SALINE INFUSION - SIMPLE MED: 2.5000 [IU]/h | INTRAVENOUS | Status: DC

## 2018-10-23 MED ORDER — SOD CITRATE-CITRIC ACID 500-334 MG/5ML PO SOLN: 30.0000 mL | ORAL | Status: DC | PRN

## 2018-10-23 MED ORDER — LIDOCAINE HCL (PF) 1 % IJ SOLN: 30.0000 mL | INTRAMUSCULAR | Status: DC | PRN

## 2018-10-23 MED ORDER — ONDANSETRON HCL 4 MG/2ML IJ SOLN: 4.0000 mg | Freq: Four times a day (QID) | INTRAMUSCULAR | Status: DC | PRN

## 2018-10-23 NOTE — H&P (Addendum)
LABOR AND DELIVERY ADMISSION HISTORY AND PHYSICAL NOTE  Marisa Stephens is a 28 y.o. female [phone] with IUP at [redacted]w[redacted]d by [place]. She presents to MAU after a fall Friday night. She reports she was cleaning her house and sweeping her steps when she slipped and hit buttocks and back. She denies abdominal trauma. Reports being sore this morning when she woke up and having Braxton hicks contractions. She reports continued contractions, rates pain 5/10. Hx of preterm delivery. She reports positive fetal movement. She denies leakage of fluid or vaginal bleeding.  Prenatal History/Complications: PNC at [facility] Pregnancy complications:  - Hx of preterm delivery @ 35 weeks  - Short interval between pregnancies  Past Medical History: Past Medical History:  Diagnosis Date  . Chronic kidney disease    treated  . Former smoker   . Group beta Strep positive 2016  . Headache    migraines; Tylenol helps  . Hx of chlamydia infection 2010   treated  . 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 Medical history non-contributory   . No pertinent past medical history   . Post term pregnancy at [redacted] weeks gestation 05/10/2017  . Supervision of high risk pregnancy, antepartum 10/16/2016    Clinic [facility] Prenatal Labs Dating  LMP 07/05/16 Blood type: O/Positive/-- (04/23 1617)  Genetic Screen AFP:     NIPS:Genome First screen neg Antibody:Negative (04/23 1617) Anatomic [place]  normal, posterior placenta Rubella: 4.15 (04/23 1617) GTT Third trimester: nl 2hr RPR: Non Reactive (08/16 0853)  Flu vaccine 04-08-17 HBsAg: Negative (04/23 1617)  TDaP vaccine  02/11/17                              . Unwanted fertility 04/08/2017    Past Surgical History: Past Surgical History:  Procedure Laterality Date  . NO PAST SURGERIES      Obstetrical History: OB History    Gravida  7   Para  5   Term  4   Preterm  1   AB  1   Living  5     SAB  1   TAB  0   Ectopic  0   Multiple  0   Live Births  5            Social History: Social History   Socioeconomic History  . Marital status: Single    Spouse name: Not on file  . Number of children: Not on file  . Years of education: Not on file  . Highest education level: Not on file  Occupational History  . Not on file  Social Needs  . Financial resource strain: Not hard at all  . Food insecurity:    Worry: Never true    Inability: Never true  . Transportation needs:    Medical: No    Non-medical: No  Tobacco Use  . Smoking status: Former Smoker    Packs/day: 0.25    Years: 5.00    Pack years: 1.25    Types: Cigarettes  . Smokeless tobacco: Never Used  Substance and Sexual Activity  . Alcohol use: No  . Drug use: Not Currently    Types: Marijuana    Comment: not using at this moment  . Sexual activity: Not Currently    Birth control/protection: None    Comment: currently pregnant  Lifestyle  . Physical activity:    Days per week: Not on file    Minutes  per session: Not on file  . Stress: Not on file  Relationships  . Social connections:    Talks on phone: Not on file    Gets together: Not on file    Attends religious service: Not on file    Active member of club or organization: Not on file    Attends meetings of clubs or organizations: Not on file    Relationship status: Not on file  Other Topics Concern  . Not on file  Social History Narrative  . Not on file    Family History: Family History  Problem Relation Age of Onset  . Diabetes Maternal Grandmother     Allergies: No Known Allergies  Medications Prior to Admission  Medication Sig Dispense Refill Last Dose  . acetaminophen (TYLENOL) 500 MG tablet Take 1,000 mg by mouth every 8 (eight) hours as needed for mild pain or headache.   10/22/2018 at Unknown time  . Prenatal Multivit-Min-Fe-FA (PRENATAL VITAMINS) 0.8 MG tablet Take 1 tablet by mouth daily. 30 tablet 12 Past Week at Unknown time  . hydroxyprogesterone caproate (MAKENA) 250 mg/mL OIL injection  Inject 1 mL (250 mg total) into the muscle once for 1 dose. 1 mL 4 Taking     Review of Systems  All systems reviewed and negative except as stated in HPI  Physical Exam Blood pressure 133/82, pulse 91, temperature 98.4 F (36.9 C), resp. rate 18, height 5' 1.5" (1.562 m), weight 74.8 kg, last menstrual period 02/02/2018, unknown if currently breastfeeding. General appearance: alert, cooperative and no distress Lungs: clear to auscultation bilaterally Heart: regular rate and rhythm Abdomen: soft, non-tender; bowel sounds normal Extremities: No calf swelling or tenderness Presentation: cephalic Fetal monitoring: 135/ moderate/ +accels/ no decelerations  Uterine activity: 3-6 minutes  Dilation: 3.5 Effacement (%): 60 Station: -3 Exam by:: Lanice ShirtsV Rogers CNM   MDM:  4 hour fetal monitoring after fall and current abdominal pain  Initial cervical examination 2/50/-3.  Hydration   Consult with Dr Vergie LivingPickens with assessment and management. Recommends admission to L&D with prophylactic GBS treatment.   Report called to labor team, care taken over Sharyon CableVeronica C Rogers, CNM 10/23/18, 2:44 AM   Prenatal labs: ABO, Rh: O/Positive/-- (11/06 1453) Antibody: Negative (11/06 1453) Rubella: 4.05 (11/06 1453) RPR: Non Reactive (03/06 1124)  HBsAg: Negative (11/06 1453)  HIV: Non Reactive (03/06 1124)  GC/Chlamydia: Negative (08/05/18) GBS:   pending 1 hr Glucola: 85-112-99 Genetic screening:  Negative  Anatomy US: normal   Prenatal Transfer Tool  Maternal Diabetes: No Genetic Screening: Normal Maternal Ultrasounds/Referrals: Normal Fetal Ultrasounds or other Referrals:  None Maternal Substance Abuse:  No Significant Maternal Medications:  None Significant Maternal Lab Results: Lab values include: Other: GBS unknown  Results for orders placed or performed during the hospital encounter of 10/22/18 (from the past 24 hour(s))  Urinalysis, Routine w reflex microscopic   Collection Time:  10/22/18 10:43 PM  Result Value Ref Range   Color, Urine YELLOW YELLOW   APPearance CLEAR CLEAR   Specific Gravity, Urine 1.011 1.005 - 1.030   pH 7.0 5.0 - 8.0   Glucose, UA NEGATIVE NEGATIVE mg/dL   Hgb urine dipstick NEGATIVE NEGATIVE   Bilirubin Urine NEGATIVE NEGATIVE   Ketones, ur NEGATIVE NEGATIVE mg/dL   Protein, ur NEGATIVE NEGATIVE mg/dL   Nitrite NEGATIVE NEGATIVE   Leukocytes,Ua TRACE (A) NEGATIVE   RBC / HPF 0-5 0 - 5 RBC/hpf   WBC, UA 6-10 0 - 5 WBC/hpf   Bacteria, UA RARE (  A) NONE SEEN   Squamous Epithelial / LPF 6-10 0 - 5   Mucus PRESENT     Patient Active Problem List   Diagnosis Date Noted  . Supervision of other normal pregnancy, antepartum 05/04/2018  . Short interval between pregnancies affecting pregnancy in first trimester, antepartum 05/04/2018  . History of premature delivery, currently pregnant 10/19/2016    Assessment: Marisa Stephens is a 26 y.o. [id number] at [redacted]w[redacted]d here for threatened preterm labor   #Labor: Monitor labor and recheck cervix, possible dc home around noon if no cervical change  #Pain: Plans epidural  #FWB: Cat I  #ID:  GBS unknown- PCN  #MOF: breast and formula  #MOC: BTL - no papers on file  #Circ:  Yes, outpatient   Sharyon Cable, CNM 10/23/18, 6:08 AM

## 2018-10-23 NOTE — Discharge Summary (Signed)
Antenatal Physician Discharge Summary  Patient ID: Marisa Stephens MRN: [id number] DOB/AGE: 26-Jun-1991 28 y.o.  Admit date: 10/22/2018 Discharge date: 10/23/2018  Admission Diagnoses: fell  Discharge Diagnoses:  Patient Active Problem List   Diagnosis Date Noted  . Fall 10/23/2018  . Threatened premature labor 10/23/2018  . Supervision of other normal pregnancy, antepartum 05/04/2018  . Short interval between pregnancies affecting pregnancy in first trimester, antepartum 05/04/2018  . History of premature delivery, currently pregnant 10/19/2016    Prenatal Procedures: none  Consults: None  Significant Diagnostic Studies:  Results for orders placed or performed during the hospital encounter of 10/22/18 (from the past 168 hour(s))  Urinalysis, Routine w reflex microscopic   Collection Time: 10/22/18 10:43 PM  Result Value Ref Range   Color, Urine YELLOW YELLOW   APPearance CLEAR CLEAR   Specific Gravity, Urine 1.011 1.005 - 1.030   pH 7.0 5.0 - 8.0   Glucose, UA NEGATIVE NEGATIVE mg/dL   Hgb urine dipstick NEGATIVE NEGATIVE   Bilirubin Urine NEGATIVE NEGATIVE   Ketones, ur NEGATIVE NEGATIVE mg/dL   Protein, ur NEGATIVE NEGATIVE mg/dL   Nitrite NEGATIVE NEGATIVE   Leukocytes,Ua TRACE (A) NEGATIVE   RBC / HPF 0-5 0 - 5 RBC/hpf   WBC, UA 6-10 0 - 5 WBC/hpf   Bacteria, UA RARE (A) NONE SEEN   Squamous Epithelial / LPF 6-10 0 - 5   Mucus PRESENT   CBC   Collection Time: 10/23/18  2:58 AM  Result Value Ref Range   WBC 7.9 4.0 - 10.5 K/uL   RBC 3.46 (L) 3.87 - 5.11 MIL/uL   Hemoglobin 10.7 (L) 12.0 - 15.0 g/dL   HCT 40.932.0 (L) 81.136.0 - 91.446.0 %   MCV 92.5 80.0 - 100.0 fL   MCH 30.9 26.0 - 34.0 pg   MCHC 33.4 30.0 - 36.0 g/dL   RDW 78.213.5 95.611.5 - 21.315.5 %   Platelets 188 150 - 400 K/uL   nRBC 0.0 0.0 - 0.2 %  Type and screen [facility]   Collection Time: 10/23/18  2:58 AM  Result Value Ref Range   ABO/RH(D) O POS    Antibody Screen NEG    Sample Expiration       10/26/2018 Performed at [facility], [address] [place], [place] [address]   ABO/Rh   Collection Time: 10/23/18  2:58 AM  Result Value Ref Range   ABO/RH(D)      O POS Performed at [facility], [address] [place], [place] [address]     Treatments: IV hydration  Hospital Course:  This is a 28 y.o. [id number] with IUP at 7842w0d admitted after fall at home > 24 hour from presentation with preterm contractions present and cervical change. Observed for > 12 hours and contractions became minimal and patient unable to feel.   She was admitted with contractions, noted to have a cervical exam of 2/50/-2 and changed to 3.5/60/-3. No leaking of fluid and no bleeding.   She was observed, fetal heart rate monitoring remained reassuring, and she had no signs/symptoms of progressing preterm labor or other maternal-fetal concerns.  Her cervical exam was unchanged on 10/23/2018 at 11:00 AM.  She was deemed stable for discharge to home with outpatient follow up.  Discharge Exam: BP 111/64   Pulse 73   Temp 98 F (36.7 C) (Oral)   Resp 18   Ht 5' 1.5" (1.562 m)   Wt 74.8 kg   LMP  02/02/2018   BMI 30.67 kg/m  General appearance: alert, cooperative and appears stated age Head: Normocephalic, without obvious abnormality, atraumatic Pelvic: external genitalia normal, no adnexal masses or tenderness, no cervical motion tenderness and vagina normal without discharge Extremities: extremities normal, atraumatic, no cyanosis or edema Pulses: 2+ and symmetric  NST: 145/mod/+accels, no decels Toco: q 7-10 and mild   Discharge Condition: Stable  Disposition: Discharge disposition: 01-Home or Self Care       Discharge Instructions    Discharge activity:  No Restrictions   Complete by:  As directed    Discharge diet:  No restrictions   Complete by:  As directed    Fetal Kick Count:  Lie on our left side for one hour after a meal, and count the number of times your baby  kicks.  If it is less than 5 times, get up, move around and drink some juice.  Repeat the test 30 minutes later.  If it is still less than 5 kicks in an hour, notify your doctor.   Complete by:  As directed    No sexual activity restrictions   Complete by:  As directed    Notify physician for a general feeling that "something is not right"   Complete by:  As directed    Notify physician for increase or change in vaginal discharge   Complete by:  As directed    Notify physician for intestinal cramps, with or without diarrhea, sometimes described as "gas pain"   Complete by:  As directed    Notify physician for leaking of fluid   Complete by:  As directed    Notify physician for low, dull backache, unrelieved by heat or Tylenol   Complete by:  As directed    Notify physician for menstrual like cramps   Complete by:  As directed    Notify physician for pelvic pressure   Complete by:  As directed    Notify physician for uterine contractions.  These may be painless and feel like the uterus is tightening or the baby is  "balling up"   Complete by:  As directed    Notify physician for vaginal bleeding   Complete by:  As directed    PRETERM LABOR:  Includes any of the follwing symptoms that occur between 20 - [redacted] weeks gestation.  If these symptoms are not stopped, preterm labor can result in preterm delivery, placing your baby at risk   Complete by:  As directed      Allergies as of 10/23/2018   No Known Allergies     Medication List    TAKE these medications   acetaminophen 500 MG tablet Commonly known as:  TYLENOL Take 1,000 mg by mouth every 8 (eight) hours as needed for mild pain or headache.   hydroxyprogesterone caproate 250 mg/mL Oil injection Commonly known as:  MAKENA Inject 1 mL (250 mg total) into the muscle once for 1 dose.   Prenatal Vitamins 0.8 MG tablet Take 1 tablet by mouth daily.      Follow-up Information    [facility] Follow up in  1 week(s).   Specialty:  Obstetrics and Gynecology Contact information: [address], [address] [place] [address] [phone]          Signed: Federico Flake M.D. 10/23/2018, 11:27 AM

## 2018-10-23 NOTE — Plan of Care (Signed)

## 2018-10-23 NOTE — Discharge Instructions (Signed)
Come to the MAU (maternity admission unit) for 1) Strong contractions every 2-3 minutes for at least 1 hour that do not go away when you drink water or take a warm shower. These contractions will be so strong all you can do is breath through them 2) Vaginal bleeding- anything more than spotting 3) Loss of fluid like you broke your water 4) Decreased movement of your baby  

## 2018-10-24 LAB — ABO/RH: ABO/RH(D): O POS

## 2018-10-25 LAB — CULTURE, BETA STREP (GROUP B ONLY)

## 2018-10-26 ENCOUNTER — Encounter (HOSPITAL_COMMUNITY): Payer: Self-pay

## 2018-10-26 ENCOUNTER — Inpatient Hospital Stay (HOSPITAL_COMMUNITY)
Admission: AD | Admit: 2018-10-26 | Discharge: 2018-10-27 | Disposition: A | Discharge status: Home / Self Care | Payer: Medicaid Other | Source: Ambulatory Visit | Attending: Family Medicine | Admitting: Family Medicine

## 2018-10-26 ENCOUNTER — Other Ambulatory Visit: Payer: Self-pay

## 2018-10-26 DIAGNOSIS — Z87891 Personal history of nicotine dependence: Secondary | ICD-10-CM | POA: Diagnosis not present

## 2018-10-26 DIAGNOSIS — O26833 Pregnancy related renal disease, third trimester: Secondary | ICD-10-CM | POA: Diagnosis not present

## 2018-10-26 DIAGNOSIS — O26893 Other specified pregnancy related conditions, third trimester: Secondary | ICD-10-CM | POA: Diagnosis not present

## 2018-10-26 DIAGNOSIS — Z3A35 35 weeks gestation of pregnancy: Secondary | ICD-10-CM | POA: Insufficient documentation

## 2018-10-26 DIAGNOSIS — N189 Chronic kidney disease, unspecified: Secondary | ICD-10-CM | POA: Diagnosis not present

## 2018-10-26 DIAGNOSIS — O4703 False labor before 37 completed weeks of gestation, third trimester: Secondary | ICD-10-CM | POA: Diagnosis present

## 2018-10-26 DIAGNOSIS — O09891 Supervision of other high risk pregnancies, first trimester: Secondary | ICD-10-CM

## 2018-10-26 DIAGNOSIS — O09893 Supervision of other high risk pregnancies, third trimester: Secondary | ICD-10-CM | POA: Diagnosis not present

## 2018-10-26 DIAGNOSIS — O133 Gestational [pregnancy-induced] hypertension without significant proteinuria, third trimester: Secondary | ICD-10-CM | POA: Insufficient documentation

## 2018-10-26 LAB — COMPREHENSIVE METABOLIC PANEL
ALT: 16 U/L (ref 0–44)
AST: 22 U/L (ref 15–41)
Albumin: 2.7 g/dL — ABNORMAL LOW (ref 3.5–5.0)
Alkaline Phosphatase: 143 U/L — ABNORMAL HIGH (ref 38–126)
Anion gap: 10 (ref 5–15)
BUN: 7 mg/dL (ref 6–20)
CO2: 21 mmol/L — ABNORMAL LOW (ref 22–32)
Calcium: 9.2 mg/dL (ref 8.9–10.3)
Chloride: 106 mmol/L (ref 98–111)
Creatinine, Ser: 0.62 mg/dL (ref 0.44–1.00)
GFR calc Af Amer: >60 mL/min (ref >60)
GFR calc non Af Amer: >60 mL/min (ref >60)
Glucose, Bld: 120 mg/dL — ABNORMAL HIGH (ref 70–99)
Potassium: 3.4 mmol/L — ABNORMAL LOW (ref 3.5–5.1)
Sodium: 137 mmol/L (ref 135–145)
Total Bilirubin: 0.8 mg/dL (ref 0.3–1.2)
Total Protein: 5.9 g/dL — ABNORMAL LOW (ref 6.5–8.1)

## 2018-10-26 LAB — URINALYSIS, ROUTINE W REFLEX MICROSCOPIC
Bilirubin Urine: NEGATIVE
Glucose, UA: NEGATIVE mg/dL
Hgb urine dipstick: NEGATIVE
Ketones, ur: NEGATIVE mg/dL
Leukocytes,Ua: NEGATIVE
Nitrite: NEGATIVE
Protein, ur: NEGATIVE mg/dL
Specific Gravity, Urine: 1.006 (ref 1.005–1.030)
pH: 7 (ref 5.0–8.0)

## 2018-10-26 LAB — CBC
HCT: 30.9 % — ABNORMAL LOW (ref 36.0–46.0)
Hemoglobin: 10.5 g/dL — ABNORMAL LOW (ref 12.0–15.0)
MCH: 30.4 pg (ref 26.0–34.0)
MCHC: 34 g/dL (ref 30.0–36.0)
MCV: 89.6 fL (ref 80.0–100.0)
Platelets: 197 10*3/uL (ref 150–400)
RBC: 3.45 MIL/uL — ABNORMAL LOW (ref 3.87–5.11)
RDW: 13.4 % (ref 11.5–15.5)
WBC: 8 10*3/uL (ref 4.0–10.5)
nRBC: 0 % (ref 0.0–0.2)

## 2018-10-26 MED ORDER — NIFEDIPINE 10 MG PO CAPS
10.0000 mg | ORAL_CAPSULE | ORAL | Status: AC | PRN
  Administered 2018-10-26 – 2018-10-27 (×4): 10 mg via ORAL
  Filled 2018-10-26 (×4): qty 1

## 2018-10-26 NOTE — MAU Note (Signed)
Pt has had ctx since she was here a couple of days ago. Getting worse, rates 6/10. No LOF or bleeding. +FM

## 2018-10-26 NOTE — MAU Provider Note (Signed)
Chief Complaint:  Contractions   First Provider Initiated Contact with Patient 10/26/18 2243      HPI: Marisa Stephens is a 28 y.o. [id number] at 78w3dwho presents to maternity admissions reporting contractions.   BP noted to be elevated during assessment.  She states she has no history of hypertension. . She reports good fetal movement, denies LOF, vaginal bleeding, vaginal itching/burning, urinary symptoms, h/a, dizziness, n/v, diarrhea, constipation or fever/chills.  She denies headache, visual changes or RUQ abdominal pain.  RN Note: Pt has had ctx since she was here a couple of days ago. Getting worse, rates 6/10. No LOF or bleeding. +FM  Past Medical History: Past Medical History:  Diagnosis Date  . Chronic kidney disease    treated  . Former smoker   . Group beta Strep positive 2016  . Headache    migraines; Tylenol helps  . Hx of chlamydia infection 2010   treated  . 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 Post term pregnancy at [redacted] weeks gestation 05/10/2017  . Supervision of high risk pregnancy, antepartum 10/16/2016    Clinic [facility] Prenatal Labs Dating  LMP 07/05/16 Blood type: O/Positive/-- (04/23 1617)  Genetic Screen AFP:     NIPS:Genome First screen neg Antibody:Negative (04/23 1617) Anatomic [place]  normal, posterior placenta Rubella: 4.15 (04/23 1617) GTT Third trimester: nl 2hr RPR: Non Reactive (08/16 0853)  Flu vaccine 04-08-17 HBsAg: Negative (04/23 1617)  TDaP vaccine  02/11/17                              . Unwanted fertility 04/08/2017    Past obstetric history: OB History  Gravida Para Term Preterm AB Living  7 5 4 1 1 5   SAB TAB Ectopic Multiple Live Births  1 0 0 0 5    # Outcome Date GA Lbr Len/2nd Weight Sex Delivery Anes PTL Lv  7 Current           6 Term 05/10/17 [redacted]w[redacted]d 03:46 / 00:08 3235 g F Vag-Spont EPI  LIV     Birth Comments: WNL  5 Term 01/29/15 [redacted]w[redacted]d 01:38 / 00:06 2821 g F Vag-Spont None  LIV  4 Preterm 12/19/13 [redacted]w[redacted]d 04:29 / 00:06 2070 g M  Vag-Spont None  LIV     Birth Comments: premature  3 Term 12/06/12 [redacted]w[redacted]d 08:21 / 00:16 2960 g F Vag-Spont EPI  LIV     Birth Comments: WNL  2 SAB 2012          1 Term 2008    F Vag-Spont   LIV    Past Surgical History: Past Surgical History:  Procedure Laterality Date  . NO PAST SURGERIES      Family History: Family History  Problem Relation Age of Onset  . Diabetes Maternal Grandmother     Social History: Social History   Tobacco Use  . Smoking status: Former Smoker    Packs/day: 0.25    Years: 5.00    Pack years: 1.25    Types: Cigarettes  . Smokeless tobacco: Never Used  Substance Use Topics  . Alcohol use: No  . Drug use: Not Currently    Types: Marijuana    Comment: not using at this moment    Allergies: No Known Allergies  Meds:  Medications Prior to Admission  Medication Sig Dispense Refill Last Dose  . acetaminophen (TYLENOL) 500 MG tablet Take 1,000 mg by mouth every 8 (eight)  hours as needed for mild pain or headache.   10/22/2018 at Unknown time  . hydroxyprogesterone caproate (MAKENA) 250 mg/mL OIL injection Inject 1 mL (250 mg total) into the muscle once for 1 dose. 1 mL 4 Taking  . Prenatal Multivit-Min-Fe-FA (PRENATAL VITAMINS) 0.8 MG tablet Take 1 tablet by mouth daily. 30 tablet 12 Past Week at Unknown time    I have reviewed patient's Past Medical Hx, Surgical Hx, Family Hx, Social Hx, medications and allergies.   ROS:  Review of Systems  Constitutional: Negative for chills and fever.  Eyes: Negative for visual disturbance.  Respiratory: Negative for shortness of breath.   Gastrointestinal: Positive for abdominal pain. Negative for constipation, diarrhea and nausea.  Genitourinary: Negative for vaginal bleeding.  Neurological: Negative for headaches.   Other systems negative  Physical Exam   Patient Vitals for the past 24 hrs:  BP Temp Temp src Pulse Resp SpO2 Weight  10/26/18 2239 135/86 - - (!) 103 - - -  10/26/18 2217 139/89 98.6 F  (37 C) Oral 99 20 100 % 75.3 kg   Constitutional: Well-developed, well-nourished female in no acute distress.  Cardiovascular: normal rate and rhythm Respiratory: normal effort, clear to auscultation bilaterally GI: Abd soft, non-tender, gravid appropriate for gestational age.   No rebound or guarding. MS: Extremities nontender, no edema, normal ROM Neurologic: Alert and oriented x 4. DTR2+/No Clonus GU: Neg CVAT.  PELVIC EXAM:   Dilation: 3.5 Effacement (%): 60 Cervical Position: Middle Station: -3 Presentation: Vertex Exam by:: Catrena Vari CNM This is unchanged from her last exam  FHT:  Baseline 140 , moderate variability, accelerations present, no decelerations Contractions: q 4-6 mins Irregular    Labs: Results for orders placed or performed during the hospital encounter of 10/26/18 (from the past 24 hour(s))  Urinalysis, Routine w reflex microscopic     Status: None   Collection Time: 10/26/18 10:27 PM  Result Value Ref Range   Color, Urine YELLOW YELLOW   APPearance CLEAR CLEAR   Specific Gravity, Urine 1.006 1.005 - 1.030   pH 7.0 5.0 - 8.0   Glucose, UA NEGATIVE NEGATIVE mg/dL   Hgb urine dipstick NEGATIVE NEGATIVE   Bilirubin Urine NEGATIVE NEGATIVE   Ketones, ur NEGATIVE NEGATIVE mg/dL   Protein, ur NEGATIVE NEGATIVE mg/dL   Nitrite NEGATIVE NEGATIVE   Leukocytes,Ua NEGATIVE NEGATIVE  Protein / creatinine ratio, urine     Status: None   Collection Time: 10/26/18 10:47 PM  Result Value Ref Range   Creatinine, Urine 51.92 mg/dL   Total Protein, Urine <6 mg/dL   Protein Creatinine Ratio        0.00 - 0.15 mg/mg[Cre]  CBC     Status: Abnormal   Collection Time: 10/26/18 11:01 PM  Result Value Ref Range   WBC 8.0 4.0 - 10.5 K/uL   RBC 3.45 (L) 3.87 - 5.11 MIL/uL   Hemoglobin 10.5 (L) 12.0 - 15.0 g/dL   HCT 47.430.9 (L) 25.936.0 - 56.346.0 %   MCV 89.6 80.0 - 100.0 fL   MCH 30.4 26.0 - 34.0 pg   MCHC 34.0 30.0 - 36.0 g/dL   RDW 87.513.4 64.311.5 - 32.915.5 %   Platelets 197 150 - 400  K/uL   nRBC 0.0 0.0 - 0.2 %  Comprehensive metabolic panel     Status: Abnormal   Collection Time: 10/26/18 11:01 PM  Result Value Ref Range   Sodium 137 135 - 145 mmol/L   Potassium 3.4 (L) 3.5 - 5.1 mmol/L  Chloride 106 98 - 111 mmol/L   CO2 21 (L) 22 - 32 mmol/L   Glucose, Bld 120 (H) 70 - 99 mg/dL   BUN 7 6 - 20 mg/dL   Creatinine, Ser 3.47 0.44 - 1.00 mg/dL   Calcium 9.2 8.9 - 42.5 mg/dL   Total Protein 5.9 (L) 6.5 - 8.1 g/dL   Albumin 2.7 (L) 3.5 - 5.0 g/dL   AST 22 15 - 41 U/L   ALT 16 0 - 44 U/L   Alkaline Phosphatase 143 (H) 38 - 126 U/L   Total Bilirubin 0.8 0.3 - 1.2 mg/dL   GFR calc non Af Amer >60 >60 mL/min   GFR calc Af Amer >60 >60 mL/min   Anion gap 10 5 - 15    --/--/O POS, O POS (04/26 0258)  Imaging:  No results found.  MAU Course/MDM: I have ordered labs and reviewed results. Preeclampsia labs are normal  NST reviewed and is reactive    Assessment: Single intrauterine pregnancy at [redacted]w[redacted]d Preterm contractions  With no change in cervix Intermittent elevated BP with normal Preeclampsia labs  Plan: Discharge home Preterm Labor precautions and fetal kick counts Preeclampsia precautions Follow up in Office for prenatal visits and recheck  Encouraged to return here or to other Urgent Care/ED if she develops worsening of symptoms, increase in pain, fever, or other concerning symptoms.   Pt stable at time of discharge.  Wynelle Bourgeois CNM, MSN Certified Nurse-Midwife 10/26/2018 10:43 PM

## 2018-10-27 DIAGNOSIS — Z3A35 35 weeks gestation of pregnancy: Secondary | ICD-10-CM

## 2018-10-27 DIAGNOSIS — O133 Gestational [pregnancy-induced] hypertension without significant proteinuria, third trimester: Secondary | ICD-10-CM

## 2018-10-27 DIAGNOSIS — O09893 Supervision of other high risk pregnancies, third trimester: Secondary | ICD-10-CM

## 2018-10-27 LAB — PROTEIN / CREATININE RATIO, URINE
Creatinine, Urine: 51.92 mg/dL
Total Protein, Urine: <6 mg/dL

## 2018-10-27 NOTE — Discharge Instructions (Signed)
Braxton Hicks Contractions °Contractions of the uterus can occur throughout pregnancy, but they are not always a sign that you are in labor. You may have practice contractions called Braxton Hicks contractions. These false labor contractions are sometimes confused with true labor. °What are Braxton Hicks contractions? °Braxton Hicks contractions are tightening movements that occur in the muscles of the uterus before labor. Unlike true labor contractions, these contractions do not result in opening (dilation) and thinning of the cervix. Toward the end of pregnancy (32-34 weeks), Braxton Hicks contractions can happen more often and may become stronger. These contractions are sometimes difficult to tell apart from true labor because they can be very uncomfortable. You should not feel embarrassed if you go to the hospital with false labor. °Sometimes, the only way to tell if you are in true labor is for your health care provider to look for changes in the cervix. The health care provider will do a physical exam and may monitor your contractions. If you are not in true labor, the exam should show that your cervix is not dilating and your water has not broken. °If there are no other health problems associated with your pregnancy, it is completely safe for you to be sent home with false labor. You may continue to have Braxton Hicks contractions until you go into true labor. °How to tell the difference between true labor and false labor °True labor °· Contractions last 30-70 seconds. °· Contractions become very regular. °· Discomfort is usually felt in the top of the uterus, and it spreads to the lower abdomen and low back. °· Contractions do not go away with walking. °· Contractions usually become more intense and increase in frequency. °· The cervix dilates and gets thinner. °False labor °· Contractions are usually shorter and not as strong as true labor contractions. °· Contractions are usually irregular. °· Contractions  are often felt in the front of the lower abdomen and in the groin. °· Contractions may go away when you walk around or change positions while lying down. °· Contractions get weaker and are shorter-lasting as time goes on. °· The cervix usually does not dilate or become thin. °Follow these instructions at home: ° °· Take over-the-counter and prescription medicines only as told by your health care provider. °· Keep up with your usual exercises and follow other instructions from your health care provider. °· Eat and drink lightly if you think you are going into labor. °· If Braxton Hicks contractions are making you uncomfortable: °? Change your position from lying down or resting to walking, or change from walking to resting. °? Sit and rest in a tub of warm water. °? Drink enough fluid to keep your urine pale yellow. Dehydration may cause these contractions. °? Do slow and deep breathing several times an hour. °· Keep all follow-up prenatal visits as told by your health care provider. This is important. °Contact a health care provider if: °· You have a fever. °· You have continuous pain in your abdomen. °Get help right away if: °· Your contractions become stronger, more regular, and closer together. °· You have fluid leaking or gushing from your vagina. °· You pass blood-tinged mucus (bloody show). °· You have bleeding from your vagina. °· You have low back pain that you never had before. °· You feel your baby’s head pushing down and causing pelvic pressure. °· Your baby is not moving inside you as much as it used to. °Summary °· Contractions that occur before labor are   called Braxton Hicks contractions, false labor, or practice contractions.  Braxton Hicks contractions are usually shorter, weaker, farther apart, and less regular than true labor contractions. True labor contractions usually become progressively stronger and regular, and they become more frequent.  Manage discomfort from [facility] contractions  by changing position, resting in a warm bath, drinking plenty of water, or practicing deep breathing. This information is not intended to replace advice given to you by your health care provider. Make sure you discuss any questions you have with your health care provider. Document Released: 10/29/2016 Document Revised: 03/30/2017 Document Reviewed: 10/29/2016 Elsevier Interactive Patient Education  2019 [employer]vier Inc.  Hypertension During Pregnancy  Hypertension is also called high blood pressure. High blood pressure means that the force of your blood moving in your body is too strong. When you are pregnant, this condition should be watched carefully. It can cause problems for you and your baby. Follow these instructions at home: Eating and drinking   Drink enough fluid to keep your pee (urine) pale yellow.  Avoid caffeine. Lifestyle  Do not use any products that contain nicotine or tobacco, such as cigarettes and e-cigarettes. If you need help quitting, ask your doctor.  Do not use alcohol or drugs.  Avoid stress.  Rest and get plenty of sleep. General instructions  Take over-the-counter and prescription medicines only as told by your doctor.  While lying down, lie on your left side. This keeps pressure off your major blood vessels.  While sitting or lying down, raise (elevate) your feet. Try putting some pillows under your lower legs.  Exercise regularly. Ask your doctor what kinds of exercise are best for you.  Keep all prenatal and follow-up visits as told by your doctor. This is important. Contact a doctor if:  You have symptoms that your doctor told you to watch for, such as: ? Throwing up (vomiting). ? Feeling sick to your stomach (nausea). ? Headache. Get help right away if you have:  Very bad belly pain that does not get better with treatment.  A very bad headache that does not get better.  Throwing up that does not get better with treatment.  Sudden, fast  weight gain.  Sudden swelling in your hands, ankles, or face.  Bleeding from your vagina.  Blood in your pee.  Fewer movements from your baby than usual.  Blurry vision.  Double vision.  Muscle twitching.  Sudden muscle tightening (spasms).  Trouble breathing.  Blue fingernails or lips. Summary  Hypertension is also called high blood pressure. High blood pressure means that the force of your blood moving in your body is too strong.  When you are pregnant, this condition should be watched carefully. It can cause problems for you and your baby.  Get help right away if you have symptoms that your doctor told you to watch for. This information is not intended to replace advice given to you by your health care provider. Make sure you discuss any questions you have with your health care provider. Document Released: 07/18/2010 Document Revised: 06/01/2017 Document Reviewed: 02/25/2016 Elsevier Interactive Patient Education  2019 [employer].

## 2018-11-02 IMAGING — US US MFM OB TRANSVAGINAL
1 series · 15 of 28 positions shown · non-contrast
Comparison: none

[Series 1: us mfm ob transvaginal · 43 acquisitions, 15 frames shown]
[im 1/43]
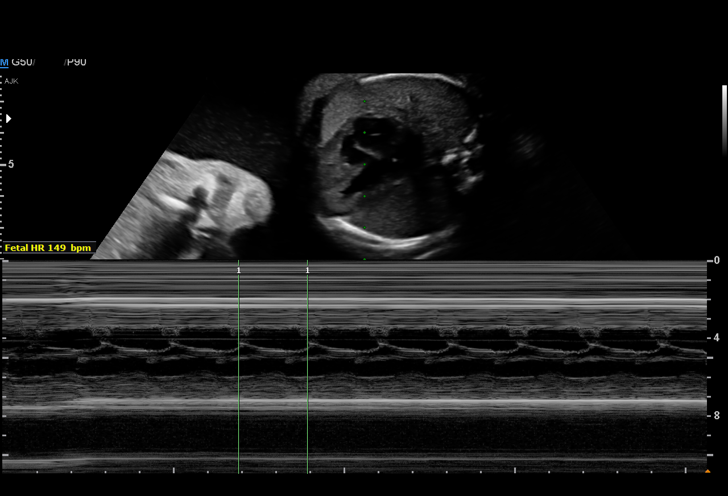
[im 4/43]
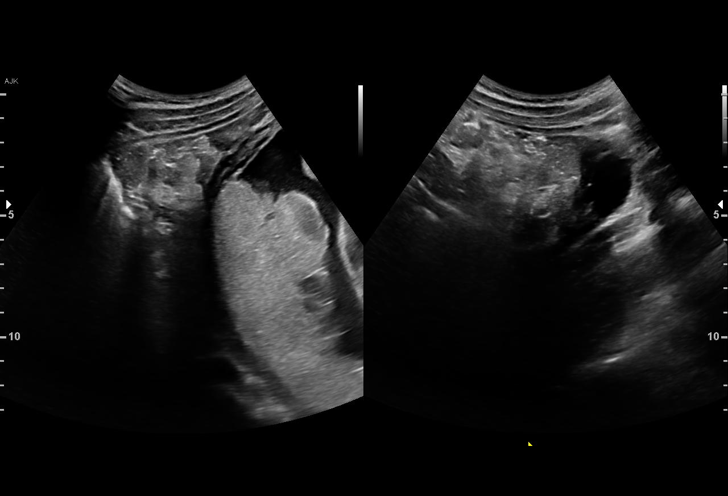
[im 7/43]
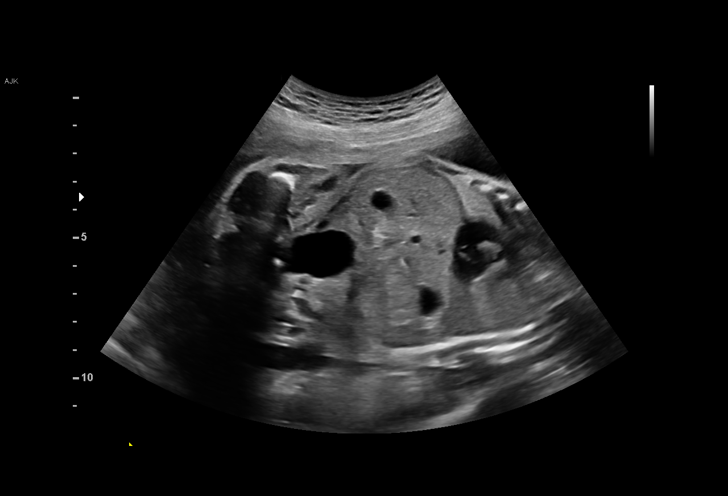
[im 10/43]
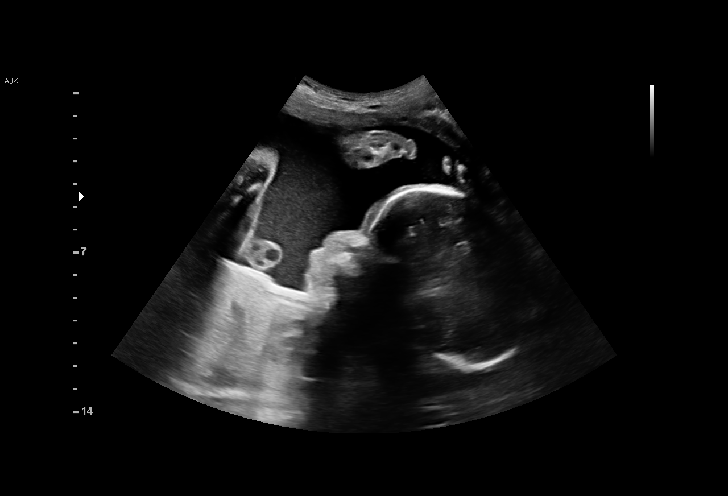
[im 13/43]
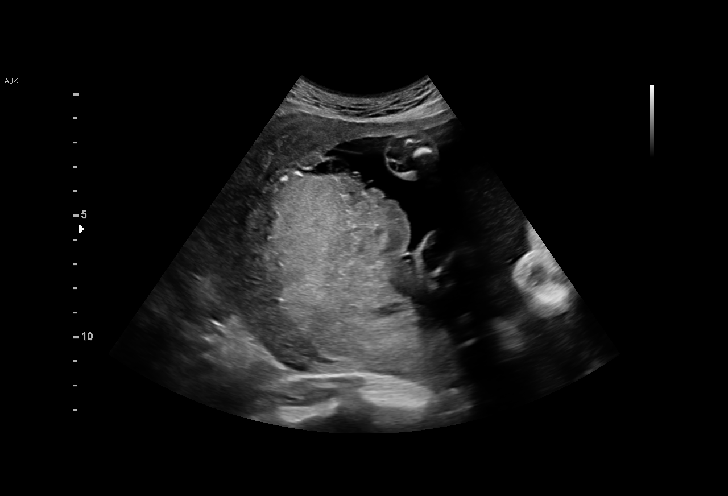
[im 16/43]
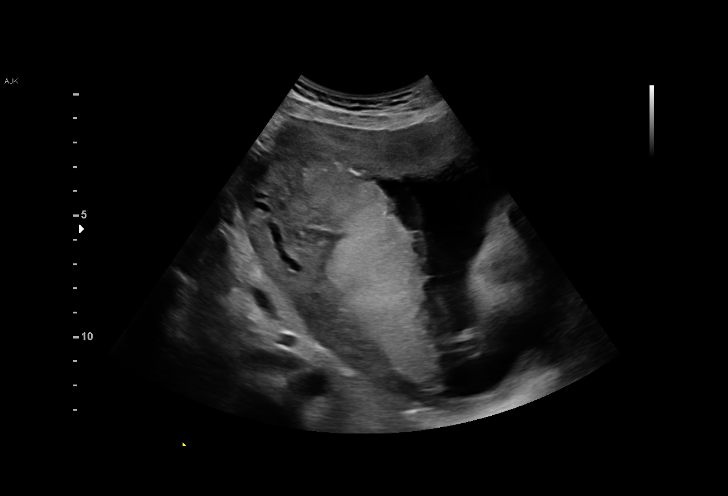
[im 19/43]
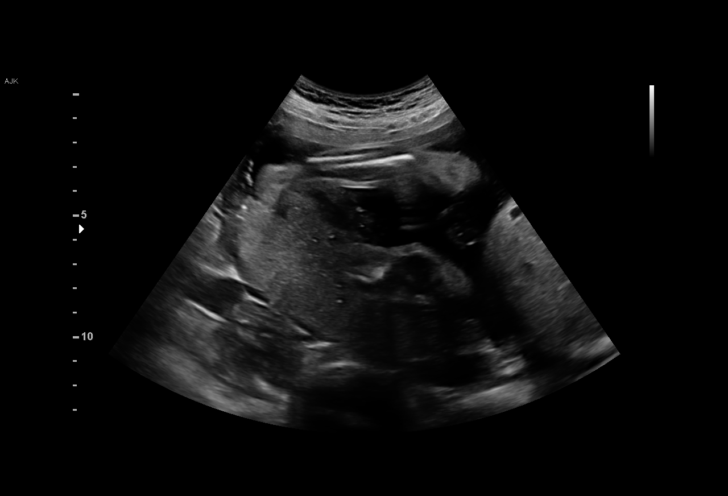
[im 22/43]
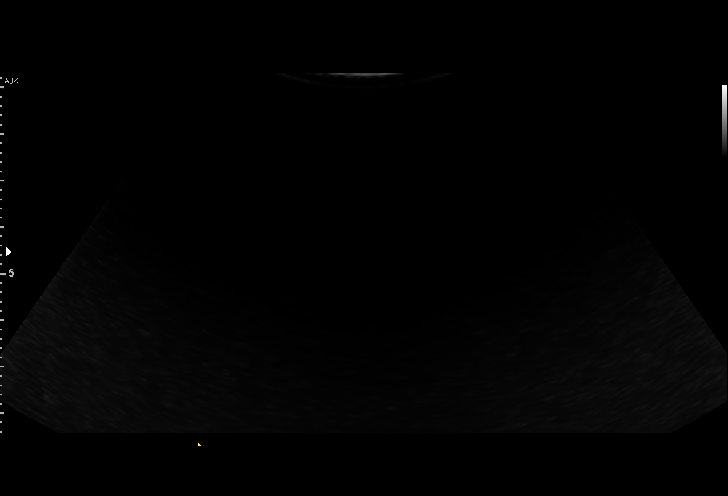
[im 24/43]
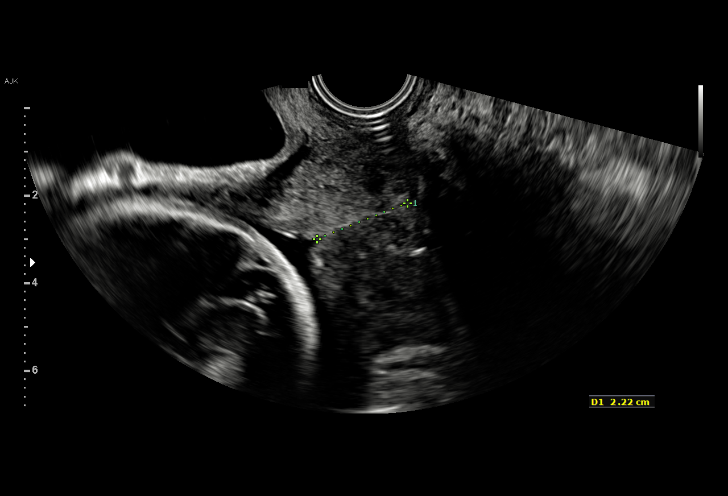
[im 27/43]
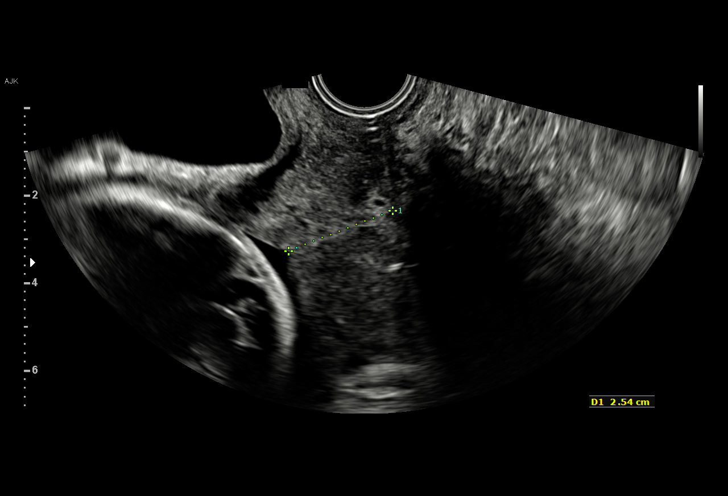
[im 30/43]
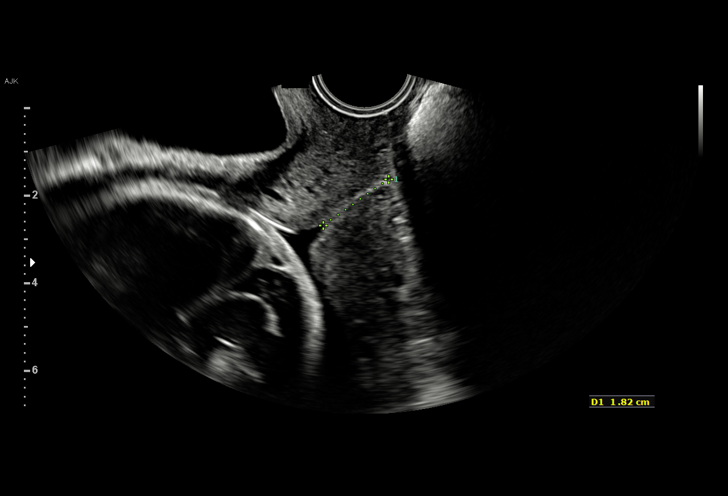
[im 33/43]
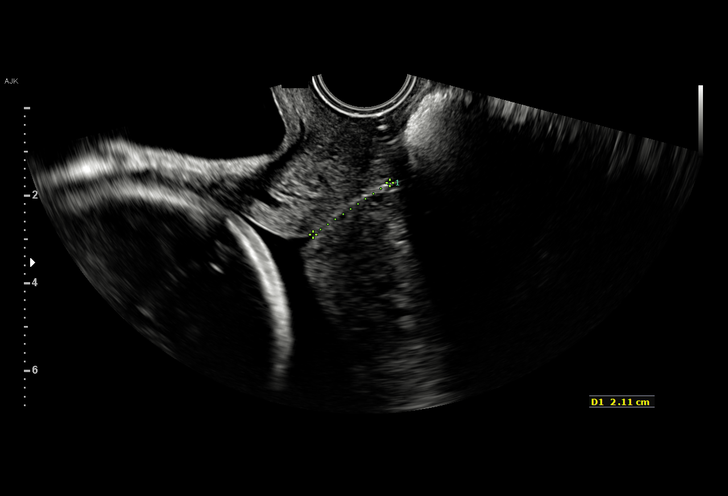
[im 36/43]
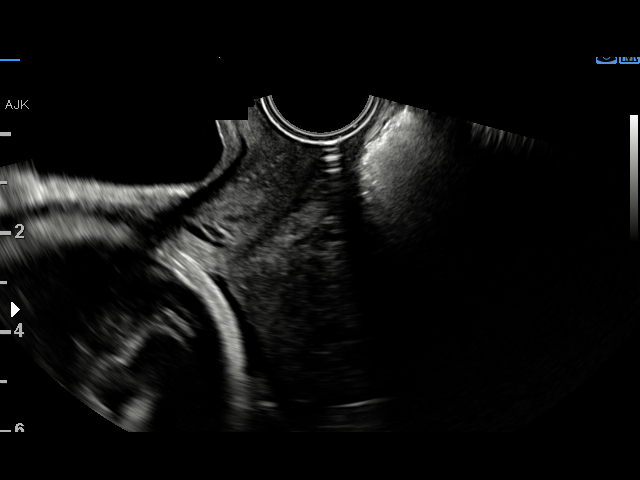
[im 39/43]
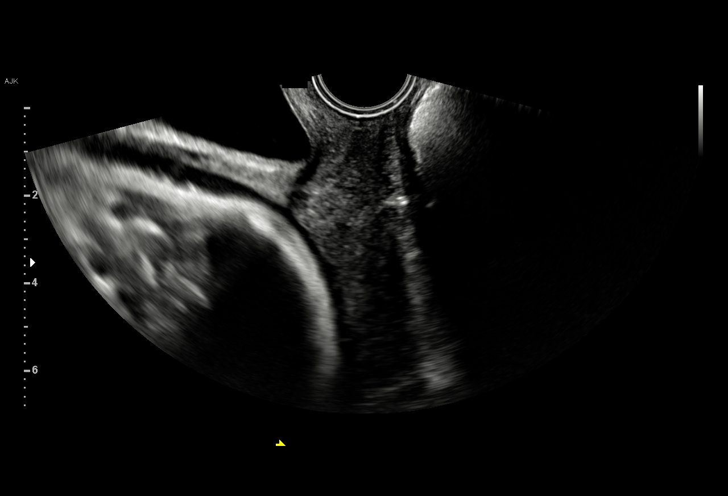
[im 43/43]
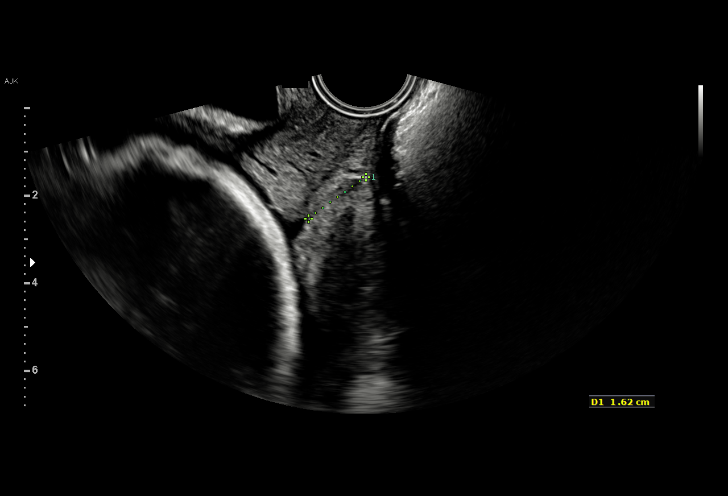

[15 of 28 positions shown; findings below may reference images not displayed]

OB/Gyn Clinic

1  AKILA KANG         [id number]      [id number]     [id number]
Indications

27 weeks gestation of pregnancy
Preterm labor
Poor obstetric history: Previous preterm
delivery, antepartum ([address])
OB History

Gravidity:    6         Term:   3        Prem:   1        SAB:   1
TOP:          0       Ectopic:  0        Living: 4
Fetal Evaluation

Num Of Fetuses:     1
Fetal Heart         149
Rate(bpm):
Cardiac Activity:   Observed
Presentation:       Cephalic
Placenta:           Posterior, above cervical os
P. Cord Insertion:  Visualized, central

Amniotic Fluid
AFI FV:      Subjectively upper-normal

Largest Pocket(cm)
10.7
Gestational Age
LMP:           30w 3d       Date:   07/05/16                 EDD:   04/11/17
Best:          27w 2d    Det. By:   U/S C R L  (10/28/16)    EDD:   05/03/17
Cervix Uterus Adnexa

Cervix
Length:            1.8  cm.
dynamic cervix (1.6-2.5cm)

Uterus
No abnormality visualized.

Left Ovary
Within normal limits.

Right Ovary
Not visualized.

Adnexa:       No abnormality visualized.
Impression

SIUP at 27+2 weeks
Cephalic presentation
High normal amniotic fluid volume
EV views of cervix: mild funneling; distal portion measured
1.8 cms
Recommendations

Follow-up as clinically indicated

## 2018-11-03 ENCOUNTER — Other Ambulatory Visit (HOSPITAL_COMMUNITY)
Admission: RE | Admit: 2018-11-03 | Discharge: 2018-11-03 | Disposition: A | Discharge status: Home / Self Care | Payer: Medicaid Other | Source: Ambulatory Visit | Attending: Certified Nurse Midwife | Admitting: Certified Nurse Midwife

## 2018-11-03 ENCOUNTER — Other Ambulatory Visit: Payer: Self-pay

## 2018-11-03 ENCOUNTER — Ambulatory Visit (INDEPENDENT_AMBULATORY_CARE_PROVIDER_SITE_OTHER): Payer: Medicaid Other | Admitting: Certified Nurse Midwife

## 2018-11-03 ENCOUNTER — Encounter: Payer: Self-pay | Admitting: Certified Nurse Midwife

## 2018-11-03 VITALS — BP 122/85 | HR 91 | Wt 162.5 lb

## 2018-11-03 DIAGNOSIS — O09213 Supervision of pregnancy with history of pre-term labor, third trimester: Secondary | ICD-10-CM

## 2018-11-03 DIAGNOSIS — Z3480 Encounter for supervision of other normal pregnancy, unspecified trimester: Secondary | ICD-10-CM

## 2018-11-03 DIAGNOSIS — O09219 Supervision of pregnancy with history of pre-term labor, unspecified trimester: Secondary | ICD-10-CM

## 2018-11-03 DIAGNOSIS — Z3A36 36 weeks gestation of pregnancy: Secondary | ICD-10-CM

## 2018-11-03 DIAGNOSIS — O09899 Supervision of other high risk pregnancies, unspecified trimester: Secondary | ICD-10-CM

## 2018-11-03 NOTE — Patient Instructions (Signed)

## 2018-11-03 NOTE — Progress Notes (Signed)
   PRENATAL VISIT NOTE  Subjective:  Marisa Stephens is a 28 y.o. [id number] at [redacted]w[redacted]d being seen today for ongoing prenatal care.  She is currently monitored for the following issues for this high-risk pregnancy and has History of premature delivery, currently pregnant; Supervision of other normal pregnancy, antepartum; Short interval between pregnancies affecting pregnancy in first trimester, antepartum; Fall; and Threatened premature labor on their problem list.  Patient reports no complaints.  Contractions: Irregular. Vag. Bleeding: None.  Movement: Present. Denies leaking of fluid.   The following portions of the patient's history were reviewed and updated as appropriate: allergies, current medications, past family history, past medical history, past social history, past surgical history and problem list.   Objective:   Vitals:   11/03/18 1404  BP: 122/85  Pulse: 91  Weight: 162 lb 8 oz (73.7 kg)    Fetal Status: Fetal Heart Rate (bpm): 152 Fundal Height: 34 cm Movement: Present  Presentation: Vertex  General:  Alert, oriented and cooperative. Patient is in no acute distress.  Skin: Skin is warm and dry. No rash noted.   Cardiovascular: Normal heart rate noted  Respiratory: Normal respiratory effort, no problems with respiration noted  Abdomen: Soft, gravid, appropriate for gestational age.  Pain/Pressure: Present     Pelvic: Cervical exam performed Dilation: 4 Effacement (%): 70 Station: -2  Extremities: Normal range of motion.  Edema: Trace  Mental Status: Normal mood and affect. Normal behavior. Normal judgment and thought content.   Assessment and Plan:  Pregnancy: [id number] at [redacted]w[redacted]d 1. Supervision of other normal pregnancy, antepartum - Patient doing well, no complaints  - Anticipatory guidance on upcoming appointments with next being webex appointment  - Labor precautions and reasons to present to MAU discussed  - Patient has hx of 41 week delivery with last baby, GBS  repeated in case she is post dates with this pregnancy  - Discussed with patient starting at 37 weeks can use RRT, IC and walking to induce contractions at home, patient verbalizes understanding  - Cervicovaginal ancillary only( [facility]) - Strep Gp B NAA  2. History of premature delivery, currently pregnant  - Threatened preterm labor on 4/26 after fall, patient reports no regular contractions since  - denies vaginal bleeding or LOF   Preterm labor symptoms and general obstetric precautions including but not limited to vaginal bleeding, contractions, leaking of fluid and fetal movement were reviewed in detail with the patient. Please refer to After Visit Summary for other counseling recommendations.   Return in about 1 week (around 11/10/2018) for ROB- webex .  Future Appointments  Date Time Provider Department Center  11/10/2018 11:15 AM Brock Bad, MD [facility] None    Sharyon Cable, CNM

## 2018-11-03 NOTE — Addendum Note (Signed)
Addended by: Maretta Bees on: 11/03/2018 03:15 PM   Modules accepted: Orders

## 2018-11-04 LAB — CERVICOVAGINAL ANCILLARY ONLY
Chlamydia: NEGATIVE
Neisseria Gonorrhea: NEGATIVE

## 2018-11-05 ENCOUNTER — Inpatient Hospital Stay (HOSPITAL_COMMUNITY)
Admission: AD | Admit: 2018-11-05 | Discharge: 2018-11-05 | Disposition: A | Discharge status: Home / Self Care | Payer: Medicaid Other | Attending: Family Medicine | Admitting: Family Medicine

## 2018-11-05 ENCOUNTER — Other Ambulatory Visit: Payer: Self-pay

## 2018-11-05 ENCOUNTER — Encounter (HOSPITAL_COMMUNITY): Payer: Self-pay | Admitting: *Deleted

## 2018-11-05 DIAGNOSIS — O471 False labor at or after 37 completed weeks of gestation: Secondary | ICD-10-CM | POA: Insufficient documentation

## 2018-11-05 DIAGNOSIS — Z3A37 37 weeks gestation of pregnancy: Secondary | ICD-10-CM | POA: Diagnosis not present

## 2018-11-05 DIAGNOSIS — O479 False labor, unspecified: Secondary | ICD-10-CM

## 2018-11-05 DIAGNOSIS — O09891 Supervision of other high risk pregnancies, first trimester: Secondary | ICD-10-CM

## 2018-11-05 LAB — STREP GP B NAA: Strep Gp B NAA: NEGATIVE

## 2018-11-05 NOTE — MAU Note (Addendum)
I have communicated with Pincus Badder, CNM and reviewed vital signs:  Vitals:   11/05/18 0337 11/05/18 0343  BP: 128/82 128/82  Pulse: 91   Resp:    Temp:      Vaginal exam:  Dilation: 5 Effacement (%): 60 Cervical Position: Middle Station: -2 Presentation: Vertex Exam by:: Roxan Hockey RN,   Also reviewed contraction pattern and that non-stress test is reactive.  It has been documented that patient is contracting every 5-6 minutes with no cervical change over 2.25 hours not indicating active labor.  Patient denies any other complaints.  Based on this report provider has given order for discharge.  A discharge order and diagnosis entered by a provider.   Labor discharge instructions reviewed with patient.

## 2018-11-05 NOTE — Discharge Instructions (Signed)
Braxton Hicks Contractions Contractions of the uterus can occur throughout pregnancy, but they are not always a sign that you are in labor. You may have practice contractions called Braxton Hicks contractions. These false labor contractions are sometimes confused with true labor. What are Braxton Hicks contractions? Braxton Hicks contractions are tightening movements that occur in the muscles of the uterus before labor. Unlike true labor contractions, these contractions do not result in opening (dilation) and thinning of the cervix. Toward the end of pregnancy (32-34 weeks), Braxton Hicks contractions can happen more often and may become stronger. These contractions are sometimes difficult to tell apart from true labor because they can be very uncomfortable. You should not feel embarrassed if you go to the hospital with false labor. Sometimes, the only way to tell if you are in true labor is for your health care provider to look for changes in the cervix. The health care provider will do a physical exam and may monitor your contractions. If you are not in true labor, the exam should show that your cervix is not dilating and your water has not broken. If there are no other health problems associated with your pregnancy, it is completely safe for you to be sent home with false labor. You may continue to have Braxton Hicks contractions until you go into true labor. How to tell the difference between true labor and false labor True labor  Contractions last 30-70 seconds.  Contractions become very regular.  Discomfort is usually felt in the top of the uterus, and it spreads to the lower abdomen and low back.  Contractions do not go away with walking.  Contractions usually become more intense and increase in frequency.  The cervix dilates and gets thinner. False labor  Contractions are usually shorter and not as strong as true labor contractions.  Contractions are usually irregular.  Contractions  are often felt in the front of the lower abdomen and in the groin.  Contractions may go away when you walk around or change positions while lying down.  Contractions get weaker and are shorter-lasting as time goes on.  The cervix usually does not dilate or become thin. Follow these instructions at home:   Take over-the-counter and prescription medicines only as told by your health care provider.  Keep up with your usual exercises and follow other instructions from your health care provider.  Eat and drink lightly if you think you are going into labor.  If Braxton Hicks contractions are making you uncomfortable: ? Change your position from lying down or resting to walking, or change from walking to resting. ? Sit and rest in a tub of warm water. ? Drink enough fluid to keep your urine pale yellow. Dehydration may cause these contractions. ? Do slow and deep breathing several times an hour.  Keep all follow-up prenatal visits as told by your health care provider. This is important. Contact a health care provider if:  You have a fever.  You have continuous pain in your abdomen. Get help right away if:  Your contractions become stronger, more regular, and closer together.  You have fluid leaking or gushing from your vagina.  You pass blood-tinged mucus (bloody show).  You have bleeding from your vagina.  You have low back pain that you never had before.  You feel your baby's head pushing down and causing pelvic pressure.  Your baby is not moving inside you as much as it used to. Summary  Contractions that occur before labor are   called Braxton Hicks contractions, false labor, or practice contractions.  Braxton Hicks contractions are usually shorter, weaker, farther apart, and less regular than true labor contractions. True labor contractions usually become progressively stronger and regular, and they become more frequent.  Manage discomfort from Braxton Hicks contractions  by changing position, resting in a warm bath, drinking plenty of water, or practicing deep breathing. This information is not intended to replace advice given to you by your health care provider. Make sure you discuss any questions you have with your health care provider. Document Released: 10/29/2016 Document Revised: 03/30/2017 Document Reviewed: 10/29/2016 Elsevier Interactive Patient Education  2019 Elsevier Inc.  

## 2018-11-05 NOTE — MAU Note (Signed)
Ctxs for couple hrs and a lot of pelvic pressure. Denies LOF or bleeding. 4cm last sve

## 2018-11-10 ENCOUNTER — Other Ambulatory Visit: Payer: Self-pay

## 2018-11-10 ENCOUNTER — Ambulatory Visit (INDEPENDENT_AMBULATORY_CARE_PROVIDER_SITE_OTHER): Payer: Medicaid Other | Admitting: Obstetrics

## 2018-11-10 ENCOUNTER — Encounter: Payer: Self-pay | Admitting: Obstetrics

## 2018-11-10 DIAGNOSIS — O0990 Supervision of high risk pregnancy, unspecified, unspecified trimester: Secondary | ICD-10-CM

## 2018-11-10 DIAGNOSIS — O0993 Supervision of high risk pregnancy, unspecified, third trimester: Secondary | ICD-10-CM

## 2018-11-10 DIAGNOSIS — O09213 Supervision of pregnancy with history of pre-term labor, third trimester: Secondary | ICD-10-CM

## 2018-11-10 DIAGNOSIS — Z3A37 37 weeks gestation of pregnancy: Secondary | ICD-10-CM

## 2018-11-10 DIAGNOSIS — O09899 Supervision of other high risk pregnancies, unspecified trimester: Secondary | ICD-10-CM

## 2018-11-10 DIAGNOSIS — O09891 Supervision of other high risk pregnancies, first trimester: Secondary | ICD-10-CM

## 2018-11-10 NOTE — Progress Notes (Signed)
   TELEHEALTH VIRTUAL OBSTETRICS PRENATAL VISIT ENCOUNTER NOTE  I connected with Marisa Stephens on 11/10/18 at 11:15 AM EDT by WebEx at home and verified that I am speaking with the correct person using two identifiers.   I discussed the limitations, risks, security and privacy concerns of performing an evaluation and management service by telephone and the availability of in person appointments. I also discussed with the patient that there may be a patient responsible charge related to this service. The patient expressed understanding and agreed to proceed. Subjective:  Marisa Stephens is a 28 y.o. [id number] at [redacted]w[redacted]d being seen today for ongoing prenatal care.  She is currently monitored for the following issues for this high-risk pregnancy and has History of premature delivery, currently pregnant; Supervision of other normal pregnancy, antepartum; Short interval between pregnancies affecting pregnancy in first trimester, antepartum; Fall; and Threatened premature labor on their problem list.  Patient reports no complaints.  Reports fetal movement. Contractions: Irritability.  .  Movement: Present. Denies any contractions, bleeding or leaking of fluid.   The following portions of the patient's history were reviewed and updated as appropriate: allergies, current medications, past family history, past medical history, past social history, past surgical history and problem list.   Objective:  There were no vitals filed for this visit.  Fetal Status:     Movement: Present     General:  Alert, oriented and cooperative. Patient is in no acute distress.  Respiratory: Normal respiratory effort, no problems with respiration noted  Mental Status: Normal mood and affect. Normal behavior. Normal judgment and thought content.  Rest of physical exam deferred due to type of encounter  Assessment and Plan:  Pregnancy: [id number] at [redacted]w[redacted]d 1. Supervision of high risk pregnancy, antepartum  2. Short  interval between pregnancies affecting pregnancy in first trimester, antepartum  3. History of premature delivery, currently pregnant   Term labor symptoms and general obstetric precautions including but not limited to vaginal bleeding, contractions, leaking of fluid and fetal movement were reviewed in detail with the patient. I discussed the assessment and treatment plan with the patient. The patient was provided an opportunity to ask questions and all were answered. The patient agreed with the plan and demonstrated an understanding of the instructions. The patient was advised to call back or seek an in-person office evaluation/go to [facility]U at [facility] for any urgent or concerning symptoms. Please refer to After Visit Summary for other counseling recommendations.   I provided 10 minutes of face-to-face via WebEx time during this encounter.  Return in about 1 week (around 11/17/2018) for [facility].   Coral Ceo, MD Center for [facility] 11-10-2018

## 2018-11-10 NOTE — Progress Notes (Signed)
ROB with no concerns. 

## 2018-11-18 ENCOUNTER — Ambulatory Visit (INDEPENDENT_AMBULATORY_CARE_PROVIDER_SITE_OTHER): Payer: Medicaid Other | Admitting: Obstetrics & Gynecology

## 2018-11-18 DIAGNOSIS — Z3480 Encounter for supervision of other normal pregnancy, unspecified trimester: Secondary | ICD-10-CM

## 2018-11-18 DIAGNOSIS — O09213 Supervision of pregnancy with history of pre-term labor, third trimester: Secondary | ICD-10-CM

## 2018-11-18 DIAGNOSIS — Z3A38 38 weeks gestation of pregnancy: Secondary | ICD-10-CM

## 2018-11-18 DIAGNOSIS — O09899 Supervision of other high risk pregnancies, unspecified trimester: Secondary | ICD-10-CM

## 2018-11-18 NOTE — Progress Notes (Signed)
   TELEHEALTH VIRTUAL OBSTETRICS VISIT ENCOUNTER NOTE  I connected with Marisa Stephens on 11/18/18 at 11:15 AM EDT by telephone at home and verified that I am speaking with the correct person using two identifiers.   I discussed the limitations, risks, security and privacy concerns of performing an evaluation and management service by telephone and the availability of in person appointments. I also discussed with the patient that there may be a patient responsible charge related to this service. The patient expressed understanding and agreed to proceed.  Subjective:  Marisa Stephens is a 28 y.o. [id number] at [redacted]w[redacted]d being followed for ongoing prenatal care.  She is currently monitored for the following issues for this high-risk pregnancy and has History of premature delivery, currently pregnant; Supervision of other normal pregnancy, antepartum; Short interval between pregnancies affecting pregnancy in first trimester, antepartum; Fall; and Threatened premature labor on their problem list.  Patient reports no complaints. Reports fetal movement. Denies any contractions, bleeding or leaking of fluid.   The following portions of the patient's history were reviewed and updated as appropriate: allergies, current medications, past family history, past medical history, past social history, past surgical history and problem list.   Objective:   General:  Alert, oriented and cooperative.   Mental Status: Normal mood and affect perceived. Normal judgment and thought content.  Rest of physical exam deferred due to type of encounter  Assessment and Plan:  Pregnancy: [id number] at [redacted]w[redacted]d 1. Supervision of other normal pregnancy, antepartum Was 4 cm at last recorded cervical exam. Need to provide BP reading asap  2. History of premature delivery, currently pregnant   Term labor symptoms and general obstetric precautions including but not limited to vaginal bleeding, contractions, leaking of fluid and  fetal movement were reviewed in detail with the patient.  I discussed the assessment and treatment plan with the patient. The patient was provided an opportunity to ask questions and all were answered. The patient agreed with the plan and demonstrated an understanding of the instructions. The patient was advised to call back or seek an in-person office evaluation/go to MAU at [facility] for any urgent or concerning symptoms. Please refer to After Visit Summary for other counseling recommendations.   I provided 10 minutes of non-face-to-face time during this encounter.  Return in about 1 week (around 11/25/2018).  No future appointments.  Scheryl Darter, MD Center for [facility], [facility]

## 2018-11-18 NOTE — Progress Notes (Signed)
Pt is on the phone preparing for webex visit with provider. [redacted]w[redacted]d. Pt does have BP cuff at home but lost the batteries. Pt denies HA's, blurry vision, or abdominal pain. Advised pt to check BP later when she finds some batteries and let us know or go to the hospital if it is abnormal. Pt verbalizes understanding.

## 2018-11-18 NOTE — Patient Instructions (Signed)

## 2018-11-20 ENCOUNTER — Encounter (HOSPITAL_COMMUNITY)
Admission: AD | Disposition: A | Discharge status: Home / Self Care | Payer: Self-pay | Source: Home / Self Care | Attending: Obstetrics & Gynecology

## 2018-11-20 ENCOUNTER — Encounter (HOSPITAL_COMMUNITY): Payer: Self-pay

## 2018-11-20 ENCOUNTER — Inpatient Hospital Stay (HOSPITAL_COMMUNITY)
Admission: AD | Admit: 2018-11-20 | Discharge: 2018-11-22 | DRG: 788 | Disposition: A | Discharge status: Home / Self Care | Payer: Medicaid Other | Attending: Obstetrics & Gynecology | Admitting: Obstetrics & Gynecology

## 2018-11-20 ENCOUNTER — Other Ambulatory Visit: Payer: Self-pay

## 2018-11-20 ENCOUNTER — Inpatient Hospital Stay (HOSPITAL_COMMUNITY): Payer: Medicaid Other | Admitting: Anesthesiology

## 2018-11-20 DIAGNOSIS — O26893 Other specified pregnancy related conditions, third trimester: Secondary | ICD-10-CM | POA: Diagnosis present

## 2018-11-20 DIAGNOSIS — Z1159 Encounter for screening for other viral diseases: Secondary | ICD-10-CM

## 2018-11-20 DIAGNOSIS — O169 Unspecified maternal hypertension, unspecified trimester: Secondary | ICD-10-CM

## 2018-11-20 DIAGNOSIS — Z30430 Encounter for insertion of intrauterine contraceptive device: Secondary | ICD-10-CM | POA: Diagnosis not present

## 2018-11-20 DIAGNOSIS — Z23 Encounter for immunization: Secondary | ICD-10-CM | POA: Diagnosis not present

## 2018-11-20 DIAGNOSIS — Z87891 Personal history of nicotine dependence: Secondary | ICD-10-CM | POA: Diagnosis not present

## 2018-11-20 DIAGNOSIS — O9902 Anemia complicating childbirth: Principal | ICD-10-CM | POA: Diagnosis present

## 2018-11-20 DIAGNOSIS — Z98891 History of uterine scar from previous surgery: Secondary | ICD-10-CM

## 2018-11-20 DIAGNOSIS — Z3A39 39 weeks gestation of pregnancy: Secondary | ICD-10-CM

## 2018-11-20 DIAGNOSIS — D649 Anemia, unspecified: Secondary | ICD-10-CM | POA: Diagnosis present

## 2018-11-20 DIAGNOSIS — O09899 Supervision of other high risk pregnancies, unspecified trimester: Secondary | ICD-10-CM

## 2018-11-20 DIAGNOSIS — O09891 Supervision of other high risk pregnancies, first trimester: Secondary | ICD-10-CM

## 2018-11-20 LAB — CBC
HCT: 34.4 % — ABNORMAL LOW (ref 36.0–46.0)
Hemoglobin: 11.3 g/dL — ABNORMAL LOW (ref 12.0–15.0)
MCH: 30.1 pg (ref 26.0–34.0)
MCHC: 32.8 g/dL (ref 30.0–36.0)
MCV: 91.5 fL (ref 80.0–100.0)
Platelets: 201 10*3/uL (ref 150–400)
RBC: 3.76 MIL/uL — ABNORMAL LOW (ref 3.87–5.11)
RDW: 14.2 % (ref 11.5–15.5)
WBC: 9.5 10*3/uL (ref 4.0–10.5)
nRBC: 0 % (ref 0.0–0.2)

## 2018-11-20 LAB — COMPREHENSIVE METABOLIC PANEL
ALT: 14 U/L (ref 0–44)
AST: 17 U/L (ref 15–41)
Albumin: 2.9 g/dL — ABNORMAL LOW (ref 3.5–5.0)
Alkaline Phosphatase: 176 U/L — ABNORMAL HIGH (ref 38–126)
Anion gap: 9 (ref 5–15)
BUN: 6 mg/dL (ref 6–20)
CO2: 21 mmol/L — ABNORMAL LOW (ref 22–32)
Calcium: 9.1 mg/dL (ref 8.9–10.3)
Chloride: 107 mmol/L (ref 98–111)
Creatinine, Ser: 0.62 mg/dL (ref 0.44–1.00)
GFR calc Af Amer: >60 mL/min (ref >60)
GFR calc non Af Amer: >60 mL/min (ref >60)
Glucose, Bld: 86 mg/dL (ref 70–99)
Potassium: 3.6 mmol/L (ref 3.5–5.1)
Sodium: 137 mmol/L (ref 135–145)
Total Bilirubin: 0.8 mg/dL (ref 0.3–1.2)
Total Protein: 6.2 g/dL — ABNORMAL LOW (ref 6.5–8.1)

## 2018-11-20 LAB — POCT FERN TEST: POCT Fern Test: POSITIVE

## 2018-11-20 LAB — TYPE AND SCREEN
ABO/RH(D): O POS
Antibody Screen: NEGATIVE

## 2018-11-20 LAB — SARS CORONAVIRUS 2 BY RT PCR (HOSPITAL ORDER, PERFORMED IN ~~LOC~~ HOSPITAL LAB): SARS Coronavirus 2: NEGATIVE

## 2018-11-20 LAB — RPR: RPR Ser Ql: NONREACTIVE

## 2018-11-20 SURGERY — Surgical Case
Anesthesia: Spinal | Wound class: Clean Contaminated

## 2018-11-20 MED ORDER — LACTATED RINGERS IV SOLN
INTRAVENOUS | Status: DC
  Administered 2018-11-20 (×4): via INTRAVENOUS

## 2018-11-20 MED ORDER — PRENATAL MULTIVITAMIN CH
1.0000 | ORAL_TABLET | Freq: Every day | ORAL | Status: DC
  Administered 2018-11-20 – 2018-11-22 (×3): 1 via ORAL
  Filled 2018-11-20 (×3): qty 1

## 2018-11-20 MED ORDER — ONDANSETRON HCL 4 MG/2ML IJ SOLN: 4.0000 mg | Freq: Three times a day (TID) | INTRAMUSCULAR | Status: DC | PRN

## 2018-11-20 MED ORDER — ONDANSETRON HCL 4 MG/2ML IJ SOLN
INTRAMUSCULAR | Status: DC | PRN
  Administered 2018-11-20: 4 mg via INTRAVENOUS

## 2018-11-20 MED ORDER — SODIUM CHLORIDE 0.9% FLUSH: 3.0000 mL | INTRAVENOUS | Status: DC | PRN

## 2018-11-20 MED ORDER — OXYTOCIN 40 UNITS IN NORMAL SALINE INFUSION - SIMPLE MED
INTRAVENOUS | Status: AC
  Filled 2018-11-20: qty 1000

## 2018-11-20 MED ORDER — ZOLPIDEM TARTRATE 5 MG PO TABS: 5.0000 mg | ORAL_TABLET | Freq: Every evening | ORAL | Status: DC | PRN

## 2018-11-20 MED ORDER — SENNOSIDES-DOCUSATE SODIUM 8.6-50 MG PO TABS
2.0000 | ORAL_TABLET | ORAL | Status: DC
  Administered 2018-11-20 – 2018-11-21 (×2): 2 via ORAL
  Filled 2018-11-20 (×2): qty 2

## 2018-11-20 MED ORDER — LACTATED RINGERS IV SOLN
INTRAVENOUS | Status: DC
  Administered 2018-11-20 – 2018-11-21 (×2): via INTRAVENOUS

## 2018-11-20 MED ORDER — MORPHINE SULFATE (PF) 0.5 MG/ML IJ SOLN
INTRAMUSCULAR | Status: AC
  Filled 2018-11-20: qty 10

## 2018-11-20 MED ORDER — FLEET ENEMA 7-19 GM/118ML RE ENEM: 1.0000 | ENEMA | RECTAL | Status: DC | PRN

## 2018-11-20 MED ORDER — FENTANYL CITRATE (PF) 100 MCG/2ML IJ SOLN: 100.0000 ug | INTRAMUSCULAR | Status: DC | PRN

## 2018-11-20 MED ORDER — ACETAMINOPHEN 325 MG PO TABS: 650.0000 mg | ORAL_TABLET | ORAL | Status: DC | PRN

## 2018-11-20 MED ORDER — DEXAMETHASONE SODIUM PHOSPHATE 10 MG/ML IJ SOLN
INTRAMUSCULAR | Status: AC
  Filled 2018-11-20: qty 1

## 2018-11-20 MED ORDER — PHENYLEPHRINE HCL-NACL 20-0.9 MG/250ML-% IV SOLN
INTRAVENOUS | Status: AC
  Filled 2018-11-20: qty 250

## 2018-11-20 MED ORDER — ONDANSETRON HCL 4 MG/2ML IJ SOLN
INTRAMUSCULAR | Status: AC
  Filled 2018-11-20: qty 2

## 2018-11-20 MED ORDER — OXYTOCIN 40 UNITS IN NORMAL SALINE INFUSION - SIMPLE MED: 2.5000 [IU]/h | INTRAVENOUS | Status: DC

## 2018-11-20 MED ORDER — KETOROLAC TROMETHAMINE 30 MG/ML IJ SOLN: 30.0000 mg | Freq: Four times a day (QID) | INTRAMUSCULAR | Status: DC | PRN

## 2018-11-20 MED ORDER — MEPERIDINE HCL 25 MG/ML IJ SOLN: 6.2500 mg | INTRAMUSCULAR | Status: DC | PRN

## 2018-11-20 MED ORDER — MAGNESIUM HYDROXIDE 400 MG/5ML PO SUSP: 30.0000 mL | ORAL | Status: DC | PRN

## 2018-11-20 MED ORDER — NALBUPHINE HCL 10 MG/ML IJ SOLN: 5.0000 mg | Freq: Once | INTRAMUSCULAR | Status: DC | PRN

## 2018-11-20 MED ORDER — LEVONORGESTREL 19.5 MCG/DAY IU IUD
INTRAUTERINE_SYSTEM | INTRAUTERINE | Status: AC
  Administered 2018-11-20: 1
  Filled 2018-11-20: qty 1

## 2018-11-20 MED ORDER — LACTATED RINGERS IV SOLN: 500.0000 mL | INTRAVENOUS | Status: DC | PRN

## 2018-11-20 MED ORDER — MENTHOL 3 MG MT LOZG: 1.0000 | LOZENGE | OROMUCOSAL | Status: DC | PRN

## 2018-11-20 MED ORDER — GABAPENTIN 100 MG PO CAPS
300.0000 mg | ORAL_CAPSULE | Freq: Two times a day (BID) | ORAL | Status: DC
  Administered 2018-11-20 – 2018-11-22 (×5): 300 mg via ORAL
  Filled 2018-11-20 (×6): qty 3

## 2018-11-20 MED ORDER — LACTATED RINGERS IV SOLN: INTRAVENOUS | Status: DC

## 2018-11-20 MED ORDER — IBUPROFEN 800 MG PO TABS
800.0000 mg | ORAL_TABLET | Freq: Four times a day (QID) | ORAL | Status: DC
  Administered 2018-11-21 – 2018-11-22 (×5): 800 mg via ORAL
  Filled 2018-11-20 (×5): qty 1

## 2018-11-20 MED ORDER — KETOROLAC TROMETHAMINE 30 MG/ML IJ SOLN
30.0000 mg | Freq: Four times a day (QID) | INTRAMUSCULAR | Status: AC
  Administered 2018-11-20 – 2018-11-21 (×3): 30 mg via INTRAVENOUS
  Filled 2018-11-20 (×3): qty 1

## 2018-11-20 MED ORDER — NALBUPHINE HCL 10 MG/ML IJ SOLN: 5.0000 mg | INTRAMUSCULAR | Status: DC | PRN

## 2018-11-20 MED ORDER — SIMETHICONE 80 MG PO CHEW: 80.0000 mg | CHEWABLE_TABLET | ORAL | Status: DC | PRN

## 2018-11-20 MED ORDER — DIPHENHYDRAMINE HCL 50 MG/ML IJ SOLN: 12.5000 mg | INTRAMUSCULAR | Status: DC | PRN

## 2018-11-20 MED ORDER — FERROUS SULFATE 325 (65 FE) MG PO TABS
325.0000 mg | ORAL_TABLET | Freq: Two times a day (BID) | ORAL | Status: DC
  Administered 2018-11-21 – 2018-11-22 (×3): 325 mg via ORAL
  Filled 2018-11-20 (×4): qty 1

## 2018-11-20 MED ORDER — OXYTOCIN BOLUS FROM INFUSION: 500.0000 mL | Freq: Once | INTRAVENOUS | Status: DC

## 2018-11-20 MED ORDER — SOD CITRATE-CITRIC ACID 500-334 MG/5ML PO SOLN
ORAL | Status: AC
  Filled 2018-11-20: qty 15

## 2018-11-20 MED ORDER — OXYCODONE-ACETAMINOPHEN 5-325 MG PO TABS: 2.0000 | ORAL_TABLET | ORAL | Status: DC | PRN

## 2018-11-20 MED ORDER — WITCH HAZEL-GLYCERIN EX PADS: 1.0000 "application " | MEDICATED_PAD | CUTANEOUS | Status: DC | PRN

## 2018-11-20 MED ORDER — SIMETHICONE 80 MG PO CHEW
80.0000 mg | CHEWABLE_TABLET | ORAL | Status: DC
  Administered 2018-11-20 – 2018-11-21 (×2): 80 mg via ORAL
  Filled 2018-11-20 (×2): qty 1

## 2018-11-20 MED ORDER — KETOROLAC TROMETHAMINE 30 MG/ML IJ SOLN
INTRAMUSCULAR | Status: DC | PRN
  Administered 2018-11-20: 30 mg via INTRAMUSCULAR

## 2018-11-20 MED ORDER — COCONUT OIL OIL: 1.0000 "application " | TOPICAL_OIL | Status: DC | PRN

## 2018-11-20 MED ORDER — ONDANSETRON HCL 4 MG/2ML IJ SOLN: 4.0000 mg | Freq: Four times a day (QID) | INTRAMUSCULAR | Status: DC | PRN

## 2018-11-20 MED ORDER — FENTANYL CITRATE (PF) 100 MCG/2ML IJ SOLN
INTRAMUSCULAR | Status: DC | PRN
  Administered 2018-11-20: 15 ug via INTRATHECAL

## 2018-11-20 MED ORDER — MEASLES, MUMPS & RUBELLA VAC IJ SOLR: 0.5000 mL | Freq: Once | INTRAMUSCULAR | Status: DC

## 2018-11-20 MED ORDER — SODIUM CHLORIDE 0.9 % IV SOLN
INTRAVENOUS | Status: DC | PRN
  Administered 2018-11-20: 60 ug/min via INTRAVENOUS

## 2018-11-20 MED ORDER — BUPIVACAINE IN DEXTROSE 0.75-8.25 % IT SOLN
INTRATHECAL | Status: DC | PRN
  Administered 2018-11-20: 1.5 mL via INTRATHECAL

## 2018-11-20 MED ORDER — OXYCODONE-ACETAMINOPHEN 5-325 MG PO TABS
2.0000 | ORAL_TABLET | ORAL | Status: DC | PRN
  Administered 2018-11-20 – 2018-11-22 (×5): 2 via ORAL
  Filled 2018-11-20 (×5): qty 2

## 2018-11-20 MED ORDER — OXYCODONE-ACETAMINOPHEN 5-325 MG PO TABS: 1.0000 | ORAL_TABLET | ORAL | Status: DC | PRN

## 2018-11-20 MED ORDER — OXYTOCIN 40 UNITS IN NORMAL SALINE INFUSION - SIMPLE MED: 2.5000 [IU]/h | INTRAVENOUS | Status: AC

## 2018-11-20 MED ORDER — SOD CITRATE-CITRIC ACID 500-334 MG/5ML PO SOLN
30.0000 mL | ORAL | Status: DC | PRN
  Administered 2018-11-20: 07:00:00 30 mL via ORAL
  Filled 2018-11-20: qty 30

## 2018-11-20 MED ORDER — DIPHENHYDRAMINE HCL 25 MG PO CAPS: 25.0000 mg | ORAL_CAPSULE | Freq: Four times a day (QID) | ORAL | Status: DC | PRN

## 2018-11-20 MED ORDER — ENOXAPARIN SODIUM 40 MG/0.4ML ~~LOC~~ SOLN
40.0000 mg | SUBCUTANEOUS | Status: DC
  Administered 2018-11-21 – 2018-11-22 (×2): 40 mg via SUBCUTANEOUS
  Filled 2018-11-20 (×2): qty 0.4

## 2018-11-20 MED ORDER — CEFAZOLIN SODIUM-DEXTROSE 2-4 GM/100ML-% IV SOLN
2.0000 g | INTRAVENOUS | Status: AC
  Administered 2018-11-20: 2 g via INTRAVENOUS
  Filled 2018-11-20: qty 100

## 2018-11-20 MED ORDER — KETOROLAC TROMETHAMINE 30 MG/ML IJ SOLN
INTRAMUSCULAR | Status: AC
  Filled 2018-11-20: qty 1

## 2018-11-20 MED ORDER — PHENYLEPHRINE 40 MCG/ML (10ML) SYRINGE FOR IV PUSH (FOR BLOOD PRESSURE SUPPORT)
PREFILLED_SYRINGE | INTRAVENOUS | Status: DC | PRN
  Administered 2018-11-20 (×2): 80 ug via INTRAVENOUS

## 2018-11-20 MED ORDER — FENTANYL CITRATE (PF) 100 MCG/2ML IJ SOLN
INTRAMUSCULAR | Status: AC
  Filled 2018-11-20: qty 2

## 2018-11-20 MED ORDER — NALOXONE HCL 4 MG/10ML IJ SOLN
1.0000 ug/kg/h | INTRAVENOUS | Status: DC | PRN
  Filled 2018-11-20: qty 5

## 2018-11-20 MED ORDER — NALOXONE HCL 0.4 MG/ML IJ SOLN: 0.4000 mg | INTRAMUSCULAR | Status: DC | PRN

## 2018-11-20 MED ORDER — LEVONORGESTREL 19.5 MCG/DAY IU IUD: INTRAUTERINE_SYSTEM | INTRAUTERINE | Status: DC

## 2018-11-20 MED ORDER — SODIUM CHLORIDE 0.9 % IV SOLN
INTRAVENOUS | Status: DC | PRN
  Administered 2018-11-20: 40 [IU] via INTRAVENOUS

## 2018-11-20 MED ORDER — SODIUM CHLORIDE 0.9 % IV SOLN
INTRAVENOUS | Status: DC | PRN
  Administered 2018-11-20: 08:00:00 via INTRAVENOUS

## 2018-11-20 MED ORDER — TETANUS-DIPHTH-ACELL PERTUSSIS 5-2.5-18.5 LF-MCG/0.5 IM SUSP: 0.5000 mL | Freq: Once | INTRAMUSCULAR | Status: DC

## 2018-11-20 MED ORDER — SOD CITRATE-CITRIC ACID 500-334 MG/5ML PO SOLN: 30.0000 mL | ORAL | Status: DC | PRN

## 2018-11-20 MED ORDER — DIBUCAINE (PERIANAL) 1 % EX OINT: 1.0000 "application " | TOPICAL_OINTMENT | CUTANEOUS | Status: DC | PRN

## 2018-11-20 MED ORDER — TRAMADOL HCL 50 MG PO TABS: 50.0000 mg | ORAL_TABLET | Freq: Four times a day (QID) | ORAL | Status: DC | PRN

## 2018-11-20 MED ORDER — DIPHENHYDRAMINE HCL 25 MG PO CAPS: 25.0000 mg | ORAL_CAPSULE | ORAL | Status: DC | PRN

## 2018-11-20 MED ORDER — FENTANYL CITRATE (PF) 100 MCG/2ML IJ SOLN: 25.0000 ug | INTRAMUSCULAR | Status: DC | PRN

## 2018-11-20 MED ORDER — DEXAMETHASONE SODIUM PHOSPHATE 10 MG/ML IJ SOLN
INTRAMUSCULAR | Status: DC | PRN
  Administered 2018-11-20: 5 mg via INTRAVENOUS

## 2018-11-20 MED ORDER — MORPHINE SULFATE (PF) 0.5 MG/ML IJ SOLN
INTRAMUSCULAR | Status: DC | PRN
  Administered 2018-11-20: .15 mg via INTRATHECAL

## 2018-11-20 MED ORDER — LIDOCAINE HCL (PF) 1 % IJ SOLN: 30.0000 mL | INTRAMUSCULAR | Status: DC | PRN

## 2018-11-20 SURGICAL SUPPLY — 33 items
CHLORAPREP W/TINT 26ML (MISCELLANEOUS) ×2 IMPLANT
CLAMP CORD UMBIL (MISCELLANEOUS) IMPLANT
CLOTH BEACON ORANGE TIMEOUT ST (SAFETY) ×2 IMPLANT
DRSG OPSITE POSTOP 3X4 (GAUZE/BANDAGES/DRESSINGS) ×2 IMPLANT
DRSG OPSITE POSTOP 4X10 (GAUZE/BANDAGES/DRESSINGS) ×2 IMPLANT
ELECT REM PT RETURN 9FT ADLT (ELECTROSURGICAL) ×2
ELECTRODE REM PT RTRN 9FT ADLT (ELECTROSURGICAL) ×1 IMPLANT
EXTRACTOR VACUUM M CUP 4 TUBE (SUCTIONS) IMPLANT
GAUZE SPONGE 4X4 12PLY STRL LF (GAUZE/BANDAGES/DRESSINGS) ×4 IMPLANT
GLOVE BIOGEL PI IND STRL 7.0 (GLOVE) ×3 IMPLANT
GLOVE BIOGEL PI INDICATOR 7.0 (GLOVE) ×3
GLOVE ECLIPSE 7.0 STRL STRAW (GLOVE) ×2 IMPLANT
GOWN STRL REUS W/TWL LRG LVL3 (GOWN DISPOSABLE) ×4 IMPLANT
KIT ABG SYR 3ML LUER SLIP (SYRINGE) IMPLANT
NEEDLE HYPO 22GX1.5 SAFETY (NEEDLE) ×2 IMPLANT
NEEDLE HYPO 25X5/8 SAFETYGLIDE (NEEDLE) ×2 IMPLANT
NS IRRIG 1000ML POUR BTL (IV SOLUTION) ×2 IMPLANT
PACK C SECTION WH (CUSTOM PROCEDURE TRAY) ×2 IMPLANT
PAD ABD 7.5X8 STRL (GAUZE/BANDAGES/DRESSINGS) ×2 IMPLANT
PAD ABD 8X10 STRL (GAUZE/BANDAGES/DRESSINGS) ×2 IMPLANT
PAD OB MATERNITY 4.3X12.25 (PERSONAL CARE ITEMS) ×2 IMPLANT
PENCIL SMOKE EVAC W/HOLSTER (ELECTROSURGICAL) ×2 IMPLANT
RTRCTR C-SECT PINK 25CM LRG (MISCELLANEOUS) IMPLANT
SUT PDS AB 0 CTX 36 PDP370T (SUTURE) IMPLANT
SUT PLAIN 2 0 XLH (SUTURE) IMPLANT
SUT VIC AB 0 CTX 36 (SUTURE) ×2
SUT VIC AB 0 CTX36XBRD ANBCTRL (SUTURE) ×2 IMPLANT
SUT VIC AB 2-0 CT1 (SUTURE) ×2 IMPLANT
SUT VIC AB 4-0 KS 27 (SUTURE) ×2 IMPLANT
SYR CONTROL 10ML LL (SYRINGE) ×2 IMPLANT
TOWEL OR 17X24 6PK STRL BLUE (TOWEL DISPOSABLE) ×2 IMPLANT
TRAY FOLEY W/BAG SLVR 14FR LF (SET/KITS/TRAYS/PACK) ×2 IMPLANT
WATER STERILE IRR 1000ML POUR (IV SOLUTION) ×2 IMPLANT

## 2018-11-20 NOTE — H&P (Addendum)
Marisa Stephens is a 28 y.o. female [phone] with IUP at 4463w0d presenting for leaking fluid since 2100.  Ctx mild and irregular per pt.     Prenatal History/Complications: [facility] Hx PTD @ 35 weeks Short interval b/t pregnancies   Nursing Staff Provider  [facility] Dating  US on 04/11/18  Language  english Anatomy US  Normal 07/04/18  Flu Vaccine  07/07/2018 Genetic Screen  NIPS: low risks  AFP: low risk    TDaP vaccine   09/29/2018 GTT Third trimester normal  Rhogam     LAB RESULTS   Feeding Plan Both breast and bottel Blood Type O/Positive/-- (11/06 1453)   Contraception Wants IUD Antibody Negative (11/06 1453)  Circumcision Yes if female Rubella 4.05 (11/06 1453)  Pediatrician  [facility] peds RPR Non Reactive (03/06 1124)   Support Person  Marisa Stephens (FOB) HBsAg Negative (11/06 1453)   Prenatal Classes no HIV Non Reactive (03/06 1124)    GBS  negative    Pap  negative (09/2016)    Past Medical History:  Diagnosis Date  . Chronic kidney disease    treated  . Former smoker   . Group beta Strep positive 2016  . Headache    migraines; Tylenol helps  . Hx of chlamydia infection 2010   treated  . 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 Post term pregnancy at [redacted] weeks gestation 05/10/2017  . Supervision of high risk pregnancy, antepartum 10/16/2016    Clinic [facility] Prenatal Labs Dating  LMP 07/05/16 Blood type: O/Positive/-- (04/23 1617)  Genetic Screen AFP:     NIPS:Genome First screen neg Antibody:Negative (04/23 1617) Anatomic US  normal, posterior placenta Rubella: 4.15 (04/23 1617) GTT Third trimester: nl 2hr RPR: Non Reactive (08/16 0853)  Flu vaccine 04-08-17 HBsAg: Negative (04/23 1617)  TDaP vaccine  02/11/17                              . Unwanted fertility 04/08/2017    Past Surgical History:  Procedure Laterality Date  . NO PAST SURGERIES      OB History    Gravida  7   Para  5   Term  4   Preterm  1   AB  1   Living  5     SAB  1    TAB  0   Ectopic  0   Multiple  0   Live Births  5           Social History   Socioeconomic History  . Marital status: Single    Spouse name: Not on file  . Number of children: Not on file  . Years of education: Not on file  . Highest education level: Not on file  Occupational History  . Not on file  Social Needs  . Financial resource strain: Not hard at all  . Food insecurity:    Worry: Never true    Inability: Never true  . Transportation needs:    Medical: No    Non-medical: No  Tobacco Use  . Smoking status: Former Smoker    Packs/day: 0.25    Years: 5.00    Pack years: 1.25    Types: Cigarettes  . Smokeless tobacco: Never Used  Substance and Sexual Activity  . Alcohol use: No  . Drug use: Not Currently    Types: Marijuana    Comment:  not using at this moment  . Sexual activity: Not Currently    Birth control/protection: None    Comment: currently pregnant  Lifestyle  . Physical activity:    Days per week: Not on file    Minutes per session: Not on file  . Stress: Not on file  Relationships  . Social connections:    Talks on phone: Not on file    Gets together: Not on file    Attends religious service: Not on file    Active member of club or organization: Not on file    Attends meetings of clubs or organizations: Not on file    Relationship status: Not on file  Other Topics Concern  . Not on file  Social History Narrative  . Not on file    Family History  Problem Relation Age of Onset  . Diabetes Maternal Grandmother     No Known Allergies  Medications Prior to Admission  Medication Sig Dispense Refill Last Dose  . acetaminophen (TYLENOL) 500 MG tablet Take 1,000 mg by mouth every 8 (eight) hours as needed for mild pain or headache.   Taking  . Prenatal Multivit-Min-Fe-FA (PRENATAL VITAMINS) 0.8 MG tablet Take 1 tablet by mouth daily. 30 tablet 12 Taking        Review of Systems  Constitutional: Negative for fever and  chills Eyes: Negative for visual disturbances Respiratory: Negative for shortness of breath, dyspnea Cardiovascular: Negative for chest pain or palpitations  Gastrointestinal: Negative for vomiting, diarrhea and constipation.  POSITIVE for abdominal pain (contractions) Genitourinary: Negative for dysuria and urgency Musculoskeletal: Negative for back pain, joint pain, myalgias  Neurological: Negative for dizziness and headaches   Objective: Blood pressure 127/76, pulse 93, temperature 98.4 F (36.9 C), temperature source Oral, resp. rate 16, height 5\' 2"  (1.575 m), weight 73.4 kg, last menstrual period 02/02/2018, SpO2 100 %, unknown if currently breastfeeding. General appearance: alert, cooperative and no distress Lungs: clear to auscultation bilaterally Heart: regular rate and rhythm Abdomen: soft, non-tender; bowel sounds normal Extremities: Homans sign is negative, no sign of DVT DTR's 2+ Presentation: cephalic Fetal monitoring  Baseline: 180 bpm, Variability: Minimal (1-6 bpm), Accelerations: none and Decelerations: Late Uterine activity  2-5 minutes, mild Leaking clear amniotic fluid Dilation: 6 Effacement (%): 50 Station: -2 Exam by:: Drenda Freeze, CNM   Prenatal Transfer Tool  Maternal Diabetes: No Genetic Screening: Normal Maternal Ultrasounds/Referrals: Normal Fetal Ultrasounds or other Referrals:  None Maternal Substance Abuse:  No Significant Maternal Medications:  None Significant Maternal Lab Results: Lab values include: Group B Strep negative   Results for orders placed or performed during the hospital encounter of 11/20/18 (from the past 24 hour(s))  POCT fern test   Collection Time: 11/20/18  4:57 AM  Result Value Ref Range   POCT Fern Test Positive = ruptured amniotic membanes     Assessment: Marisa Stephens is a 80 y.o. [id number] with an IUP at [redacted]w[redacted]d presenting for ROM, early labor, remote from delivery w/Cat 3 tracing  Elevated BPs today  Plan: #Dr.  Macon Large in to counsel pt for CS due to persistent Category 3 tracing  Jacklyn Shell, CNM   Attestation of Attending Supervision of Advanced Practice Provider (PA/CNM/NP): Evaluation and management procedures were performed by the Advanced Practice Provider under my supervision and collaboration.  I have reviewed the Advanced Practice Provider's note and chart, and I agree with the management and plan.  FHR tracing remarkable for Category III tracing, patient is remote from vaginal delivery.  Unknown etiology for now, she did have a few elevated BP upon admission but normotensive now. Labs pending.  Patient counseled about needing cesarean delivery. The risks of cesarean section discussed with the patient included but were not limited to: bleeding which may require transfusion or reoperation; infection which may require antibiotics; injury to bowel, bladder, ureters or other surrounding organs; injury to the fetus; need for additional procedures including hysterectomy in the event of a life-threatening hemorrhage; placental abnormalities wth subsequent pregnancies, incisional problems, thromboembolic phenomenon, death and other postoperative/anesthesia complications. The patient concurred with the proposed plan, giving informed written consent for the procedure.   Anesthesia and OR aware. Preoperative prophylactic antibiotics and SCDs ordered on call to the OR.    Of note, patient also desires postplacental IUD placement. Discussed risks of irregular bleeding, cramping, infection, expulsion especially in the postpartum period, malpositioning or misplacement of the IUD outside the uterus which may require further procedures and increased risk of ectopic pregnancy if pregnancy occurs (pregnancy risk is less than 0.5%). She was added to the immediate postpartum LARC list for [facility]; Penni Bombard will be placed after placental delivery.   To OR when ready.   Jaynie Collins, MD, FACOG Obstetrician &  Gynecologist, [facility] for [employer], [facility]

## 2018-11-20 NOTE — Discharge Summary (Signed)
OB Discharge Summary     Patient Name: Marisa Stephens DOB: [birth year] MRN: [id number]  Date of admission: 11/20/2018 Delivering MD: Jaynie Collins A   Date of discharge: 11/22/2018  Admitting diagnosis: 39wks, leaking fluid Intrauterine pregnancy: [redacted]w[redacted]d     Secondary diagnosis:  Active Problems:   History of premature delivery, currently pregnant   Short interval between pregnancies affecting pregnancy in first trimester, antepartum   Indication for care in labor or delivery   Labor and delivery, indication for care   Status post primary low transverse cesarean section  Additional problems: None      Discharge diagnosis: Term Pregnancy Delivered                                                                                                Post partum procedures:None   Augmentation: None  Complications: None  Hospital course:  Onset of Labor With Unplanned C/S  28 y.o. yo [id number] at [redacted]w[redacted]d was admitted in Latent Labor on 11/20/2018. Patient had a labor course significant for Cat III FHT. Membrane Rupture Time/Date: 8:02 AM ,11/20/2018   The patient went for cesarean section due to Non-Reassuring FHR, and delivered a Viable infant,11/20/2018  Details of operation can be found in separate operative note. Patient had an uncomplicated postpartum course.  She is ambulating,tolerating a regular diet, passing flatus, and urinating well.  Patient is discharged home in stable condition 11/22/18.  Physical exam  Vitals:   11/21/18 0820 11/21/18 1825 11/21/18 2300 11/22/18 0604  BP: 111/73 118/61 107/68 120/79  Pulse: 100 97 98 96  Resp: 18 18 20 18   Temp: 98.3 F (36.8 C) 98.4 F (36.9 C) 98.1 F (36.7 C) 98.2 F (36.8 C)  TempSrc:  Oral Oral Oral  SpO2: 100% 100% 100%   Weight:      Height:       General: alert, cooperative and no distress Lochia: appropriate Uterine Fundus: firm Incision: healing well, no significant drainage DVT Evaluation: No evidence of DVT seen on  physical exam. Examined by Wynelle Bourgeois, CNM on day of discharge. Exam copied from her progress note.    Labs: Lab Results  Component Value Date   WBC 18.9 (H) 11/21/2018   HGB 6.9 (LL) 11/21/2018   HCT 20.8 (L) 11/21/2018   MCV 91.6 11/21/2018   PLT 224 11/21/2018   CMP Latest Ref Rng & Units 11/21/2018  Glucose 70 - 99 mg/dL -  BUN 6 - 20 mg/dL -  Creatinine 0.03 - 7.04 mg/dL 8.88  Sodium 916 - 945 mmol/L -  Potassium 3.5 - 5.1 mmol/L -  Chloride 98 - 111 mmol/L -  CO2 22 - 32 mmol/L -  Calcium 8.9 - 10.3 mg/dL -  Total Protein 6.5 - 8.1 g/dL -  Total Bilirubin 0.3 - 1.2 mg/dL -  Alkaline Phos 38 - 038 U/L -  AST 15 - 41 U/L -  ALT 0 - 44 U/L -    Discharge instruction: per After Visit Summary and "Baby and Me Booklet".  After visit meds:  Allergies as of 11/22/2018   No Known  Allergies     Medication List    TAKE these medications   acetaminophen 500 MG tablet Commonly known as:  TYLENOL Take 1,000 mg by mouth every 8 (eight) hours as needed for mild pain or headache.   ibuprofen 800 MG tablet Commonly known as:  ADVIL Take 1 tablet (800 mg total) by mouth every 8 (eight) hours as needed.   oxyCODONE-acetaminophen 5-325 MG tablet Commonly known as:  PERCOCET/ROXICET Take 2 tablets by mouth every 4 (four) hours as needed for severe pain ((when tolerating fluids)).   Prenatal Vitamins 0.8 MG tablet Take 1 tablet by mouth daily.   senna-docusate 8.6-50 MG tablet Commonly known as:  Senokot-S Take 2 tablets by mouth daily. Start taking on:  Nov 23, 2018       Diet: routine diet  Activity: Advance as tolerated. Pelvic rest for 6 weeks.   Outpatient follow up:4 weeks Follow up Appt: Future Appointments  Date Time Provider Department Center  12/05/2018  1:30 PM Constant, Gigi GinPeggy, MD [facility] None  12/20/2018  9:30 AM Reva BoresPratt, Tanya S, MD [facility] None   Follow up Visit:No follow-ups on file.  Postpartum contraception: Lilleta PP IUD placed in OR    Newborn Data: Live born female  Birth Weight:   APGAR: 9, 9  Newborn Delivery   Birth date/time:  11/20/2018 08:03:00 Delivery type:  C-Section, Low Transverse Trial of labor:  Yes C-section categorization:  Primary     Baby Feeding: Bottle and Breast Disposition:home with mother   11/22/2018 De Hollingsheadatherine L Wallace, DO

## 2018-11-20 NOTE — Anesthesia Procedure Notes (Signed)
Spinal  Patient location during procedure: OR Start time: 11/20/2018 7:38 AM End time: 11/20/2018 7:43 AM Staffing Anesthesiologist: Mal Amabile, MD Performed: anesthesiologist  Preanesthetic Checklist Completed: patient identified, site marked, surgical consent, pre-op evaluation, timeout performed, IV checked, risks and benefits discussed and monitors and equipment checked Spinal Block Patient position: sitting Prep: site prepped and draped and DuraPrep Patient monitoring: heart rate, cardiac monitor, continuous pulse ox and blood pressure Approach: midline Location: L3-4 Injection technique: single-shot Needle Needle type: Pencan  Needle gauge: 24 G Needle length: 9 cm Needle insertion depth: 6 cm Assessment Sensory level: T4 Additional Notes Patient tolerated procedure well. Adequate sensory level.

## 2018-11-20 NOTE — Op Note (Signed)
Marisa Stephens PROCEDURE DATE: 11/20/2018  PREOPERATIVE DIAGNOSES: Intrauterine pregnancy at [redacted]w[redacted]d weeks gestation; non-reassuring fetal status remote from vaginal delivery; desires intrauterine device for contraception  POSTOPERATIVE DIAGNOSES: The same  PROCEDURE: Primary Low Transverse Cesarean Section and Postplacental Intrauterine Device Placement  SURGEON:  Dr. Jaynie Collins  ASSISTANT:  Dr. Marcy Siren  ANESTHESIOLOGY TEAM: Anesthesiologist: Mal Amabile, MD CRNA: Marny Lowenstein, CRNA  INDICATIONS: Marisa Stephens is a 28 y.o. [phone] at [redacted]w[redacted]d here for cesarean section secondary to the indications listed under preoperative diagnoses; please see preoperative note for further details.  The risks of cesarean section were discussed with the patient including but were not limited to: bleeding which may require transfusion or reoperation; infection which may require antibiotics; injury to bowel, bladder, ureters or other surrounding organs; injury to the fetus; need for additional procedures including hysterectomy in the event of a life-threatening hemorrhage; placental abnormalities wth subsequent pregnancies, incisional problems, thromboembolic phenomenon and other postoperative/anesthesia complications.   The patient concurred with the proposed plan, giving informed written consent for the procedure. Also desired IUD for contraception.    FINDINGS:  Viable female infant in cephalic presentation.  Apgars 9 and 9.  Clear amniotic fluid.  Intact placenta, three vessel cord.  Normal uterus, fallopian tubes and ovaries bilaterally. Liletta IUD placed after delivery of placenta  ANESTHESIA: Spinal ESTIMATED BLOOD LOSS: 377 ml SPECIMENS: Placenta sent to pathology COMPLICATIONS: None immediate  PROCEDURE IN DETAIL:  The patient preoperatively received intravenous antibiotics and had sequential compression devices applied to her lower extremities.  She was then taken to the  operating room where spinal anesthesia was administered and was found to be adequate. She was then placed in a dorsal supine position with a leftward tilt, and prepped and draped in a sterile manner.  A foley catheter was placed into her bladder and attached to constant gravity.  After an adequate timeout was performed, a Pfannenstiel skin incision was made with scalpel and carried through to the underlying layer of fascia. The fascia was incised in the midline, and this incision was extended bilaterally using the Mayo scissors.  Kocher clamps were applied to the superior aspect of the fascial incision and the underlying rectus muscles were dissected off bluntly and sharply.  A similar process was carried out on the inferior aspect of the fascial incision. The rectus muscles were separated in the midline and the peritoneum was entered bluntly. The Alexis self-retaining retractor was introduced into the abdominal cavity.  Attention was turned to the lower uterine segment where a low transverse hysterotomy was made with a scalpel and extended bilaterally bluntly.  The infant was successfully delivered, the cord was clamped and cut after one minute, and the infant was handed over to the awaiting neonatology team. Uterine massage was then administered, and the placenta delivered intact with a three-vessel cord. The uterus was then cleared of clots and debris.  The Liletta IUD was then placed in a fundal location, the strings were pushed into open lower uterine segment and cervix. The hysterotomy was closed with 0 Vicryl in a running locked fashion, and an imbricating layer was also placed with 0 Vicryl.  Figure-of-eight 0 Vicryl serosal stitches were placed to help with hemostasis.  The pelvis was cleared of all clot and debris. Hemostasis was confirmed on all surfaces.  The retractor was removed.  The peritoneum was closed with a 2-0 Vicryl running stitch. The fascia was then closed using 0 Vicryl  in a running  fashion.  The subcutaneous layer was irrigated, and the skin was closed with a 4-0 Vicryl subcuticular stitch. The patient tolerated the procedure well. Sponge, instrument and needle counts were correct x 3.  She was taken to the recovery room in stable condition.    Jaynie Collins, MD, FACOG Obstetrician & Gynecologist, [facility] for [employer], [facility]

## 2018-11-20 NOTE — Anesthesia Preprocedure Evaluation (Addendum)
Anesthesia Evaluation  Patient identified by MRN, date of birth, ID band Patient awake    Reviewed: Allergy & Precautions, NPO status , Patient's Chart, lab work & pertinent test results  Airway Mallampati: III  TM Distance: >3 FB Neck ROM: Full    Dental no notable dental hx. (+) Teeth Intact   Pulmonary former smoker,    Pulmonary exam normal breath sounds clear to auscultation       Cardiovascular negative cardio ROS Normal cardiovascular exam Rhythm:Regular Rate:Normal     Neuro/Psych  Headaches, negative psych ROS   GI/Hepatic GERD  Medicated,(+)     substance abuse  marijuana use,   Endo/Other  negative endocrine ROS  Renal/GU Renal disease  negative genitourinary   Musculoskeletal negative musculoskeletal ROS (+)   Abdominal   Peds  Hematology negative hematology ROS (+) anemia ,   Anesthesia Other Findings   Reproductive/Obstetrics (+) Pregnancy Non reassuring FHR tracing PTL                            Anesthesia Physical Anesthesia Plan  ASA: II and emergent  Anesthesia Plan: Spinal   Post-op Pain Management:    Induction:   PONV Risk Score and Plan: 4 or greater and Scopolamine patch - Pre-op, Ondansetron and Treatment may vary due to age or medical condition  Airway Management Planned: Natural Airway  Additional Equipment:   Intra-op Plan:   Post-operative Plan:   Informed Consent: I have reviewed the patients History and Physical, chart, labs and discussed the procedure including the risks, benefits and alternatives for the proposed anesthesia with the patient or authorized representative who has indicated his/her understanding and acceptance.       Plan Discussed with: Surgeon and CRNA  Anesthesia Plan Comments:         Anesthesia Quick Evaluation

## 2018-11-20 NOTE — Anesthesia Postprocedure Evaluation (Signed)
Anesthesia Post Note  Patient: Marisa Stephens  Procedure(s) Performed: CESAREAN SECTION (N/A )     Patient location during evaluation: PACU Anesthesia Type: Spinal Level of consciousness: awake and alert and oriented Pain management: pain level controlled Vital Signs Assessment: post-procedure vital signs reviewed and stable Respiratory status: spontaneous breathing, nonlabored ventilation and respiratory function stable Cardiovascular status: blood pressure returned to baseline and stable Postop Assessment: no headache, no backache, spinal receding, patient able to bend at knees and no apparent nausea or vomiting Anesthetic complications: no    Last Vitals:  Vitals:   11/20/18 0930 11/20/18 0949  BP: (!) 106/58 103/63  Pulse: 62 67  Resp: (!) 8 14  Temp:    SpO2: 100% 99%    Last Pain:  Vitals:   11/20/18 0930  TempSrc:   PainSc: Asleep   Pain Goal:    LLE Motor Response: Purposeful movement (11/20/18 0945) LLE Sensation: Numbness (11/20/18 0945) RLE Motor Response: Purposeful movement (11/20/18 0945) RLE Sensation: Decreased (11/20/18 0945) L Sensory Level: T12-Inguinal (groin) region (11/20/18 0945) R Sensory Level: T12-Inguinal (groin) region (11/20/18 0945) Epidural/Spinal Function Cutaneous sensation: Pins and Needles (11/20/18 0945), Patient able to flex knees: No (11/20/18 0945), Patient able to lift hips off bed: No (11/20/18 0945), Back pain beyond tenderness at insertion site: No (11/20/18 0945), Progressively worsening motor and/or sensory loss: No (11/20/18 0945), Bowel and/or bladder incontinence post epidural: No (11/20/18 0945)  Faige Seely A.

## 2018-11-20 NOTE — Transfer of Care (Signed)
Immediate Anesthesia Transfer of Care Note  Patient: Marisa Stephens  Procedure(s) Performed: CESAREAN SECTION (N/A )  Patient Location: PACU  Anesthesia Type:Spinal  Level of Consciousness: awake, alert  and oriented  Airway & Oxygen Therapy: Patient Spontanous Breathing  Post-op Assessment: Report given to RN and Post -op Vital signs reviewed and stable  Post vital signs: Reviewed and stable  Last Vitals:  Vitals Value Taken Time  BP    Temp    Pulse 91 11/20/2018  8:59 AM  Resp 21 11/20/2018  8:59 AM  SpO2 99 % 11/20/2018  8:59 AM  Vitals shown include unvalidated device data.  Last Pain:  Vitals:   11/20/18 0853  TempSrc:   PainSc: (P) 0-No pain         Complications: No apparent anesthesia complications

## 2018-11-20 NOTE — MAU Note (Addendum)
Patient presents to MAU for possible ROM.  Patient reports leaking clear fluid that started around 2100 11/19/2018. Patient reports "slow trickle" +FM denies vaginal bleeding.  Patient denies contractions

## 2018-11-21 LAB — CBC
HCT: 19.1 % — ABNORMAL LOW (ref 36.0–46.0)
HCT: 20.8 % — ABNORMAL LOW (ref 36.0–46.0)
Hemoglobin: 6.3 g/dL — CL (ref 12.0–15.0)
Hemoglobin: 6.9 g/dL — CL (ref 12.0–15.0)
MCH: 29.9 pg (ref 26.0–34.0)
MCH: 30.4 pg (ref 26.0–34.0)
MCHC: 33 g/dL (ref 30.0–36.0)
MCHC: 33.2 g/dL (ref 30.0–36.0)
MCV: 90.5 fL (ref 80.0–100.0)
MCV: 91.6 fL (ref 80.0–100.0)
Platelets: 180 10*3/uL (ref 150–400)
Platelets: 224 10*3/uL (ref 150–400)
RBC: 2.11 MIL/uL — ABNORMAL LOW (ref 3.87–5.11)
RBC: 2.27 MIL/uL — ABNORMAL LOW (ref 3.87–5.11)
RDW: 14.2 % (ref 11.5–15.5)
RDW: 14.5 % (ref 11.5–15.5)
WBC: 18.7 10*3/uL — ABNORMAL HIGH (ref 4.0–10.5)
WBC: 18.9 10*3/uL — ABNORMAL HIGH (ref 4.0–10.5)
nRBC: 0 % (ref 0.0–0.2)
nRBC: 0.2 % (ref 0.0–0.2)

## 2018-11-21 LAB — CREATININE, SERUM
Creatinine, Ser: 0.58 mg/dL (ref 0.44–1.00)
GFR calc Af Amer: >60 mL/min (ref >60)
GFR calc non Af Amer: >60 mL/min (ref >60)

## 2018-11-21 MED ORDER — PNEUMOCOCCAL VAC POLYVALENT 25 MCG/0.5ML IJ INJ
0.5000 mL | INJECTION | INTRAMUSCULAR | Status: AC
  Administered 2018-11-22: 09:00:00 0.5 mL via INTRAMUSCULAR
  Filled 2018-11-21: qty 0.5

## 2018-11-21 MED ORDER — SODIUM CHLORIDE 0.9 % IV SOLN
510.0000 mg | Freq: Once | INTRAVENOUS | Status: DC
  Filled 2018-11-21: qty 17

## 2018-11-21 NOTE — Progress Notes (Signed)
Called Dr. Earlene Plater, notified critical Hgb lab value of 6.3, Dr. Earlene Plater stated she will round on patient this morning. No new orders given. Will continue to monitor.

## 2018-11-21 NOTE — Progress Notes (Signed)
POSTPARTUM PROGRESS NOTE  POD #1  Subjective:  Marisa Stephens is a 28 y.o. [id number] s/p pLTCS at [redacted]w[redacted]d.  She reports she doing well. No acute events overnight.  She denies any problems with ambulating, voiding or po intake. Denies nausea or vomiting. She has passed flatus. Pain is well controlled.  Lochia is mild.  Objective: Blood pressure 107/71, pulse 99, temperature 98.7 F (37.1 C), temperature source Oral, resp. rate 18, height 5\' 2"  (1.575 m), weight 73.4 kg, last menstrual period 02/02/2018, SpO2 99 %, unknown if currently breastfeeding.  Physical Exam:  General: alert, cooperative and no distress Chest: no respiratory distress Heart:regular rate, distal pulses intact Abdomen: soft, nontender, non-distended  Uterine Fundus: firm, appropriately tender DVT Evaluation: No calf swelling or tenderness Extremities: No LE edema Skin: warm, dry; incision clean/dry/intact w/ honeycomb dressing in place  Recent Labs    11/20/18 0656 11/21/18 0454  HGB 11.3* 6.3*  HCT 34.4* 19.1*    Assessment/Plan: Marisa Stephens is a 61 y.o. [id number] s/p pLTCS at [redacted]w[redacted]d for NRFHT.  POD#1 - Doing welll; pain well controlled.   Routine postpartum care  OOB, ambulated  Lovenox for VTE prophylaxis Significant drop in HgB from 11.3>6.3 Anemia: asymptomatic; patient agreeable to feraheme x1 and will start po ferrous sulfate BID Contraception: Undecided  Feeding: Both   Dispo: Plan for discharge POD#2 or #3.   LOS: 1 day   Marcy Siren, D.O. OB Fellow  11/21/2018, 8:31 AM

## 2018-11-21 NOTE — Lactation Note (Signed)
This note was copied from a baby's chart. Lactation Consultation Note  Patient Name: Boy Niha Srey CLEXN'T Date: 11/21/2018   Attempted to visit with mom again but she was in the NICU....  Maternal Data    Feeding Feeding Type: (P) Breast Milk Nipple Type: (P) Slow - flow  LATCH Score                   Interventions    Lactation Tools Discussed/Used     Consult Status      Yaneliz Radebaugh S Geniyah Eischeid 11/21/2018, 3:10 PM

## 2018-11-21 NOTE — Lactation Note (Signed)
This note was copied from a baby's chart. Lactation Consultation Note  Patient Name: Marisa Stephens [id number] Date: 11/21/2018 Reason for consult: Initial assessment;NICU baby;Term  Visited with mom of a 73 hours old FT NICU female, baby is not being given breastmilk and he's no longer on Similac 20 calorie formula. Mom proudly told LC that she has got 20 ml of EBM in her last pumping session and already took it to the NICU, praised her for her efforts. Mom is a P6 and experienced BF, she was able to BF her other children for 6 weeks and has a hand pump at home. Mom is already familiar with hand expression and able to see colostrum when doing so. LC attempted to visit with mom three times today, she was having her lunch during [facility] consultation.  Mom is already pumping every 3 hours, pump instructions, pumping schedule and milk storage guidelines were reviewed. Mom understands that pumping early on is mainly for breast stimulation and not to get volume and that the fact that she's getting volume is unusual, encouraged her to keep up with the good work. Revised advantages of STS care, and encouraged mom to do so whenever she has a change in the NICU, mom also said she's trying to latch baby on while in the NICU, asked mom to call for assistance if needed.   Feeding plan:  1. Encouraged mom to pump every 3 hours and at least once at night 2. Mom will continue taking her EBM to the NICU following milk storage guidelines  3. She'll continue putting baby to the breast and doing as much STS as possible  BF brochure, BF resources and NICU booklet were reviewed. Dad present but not involved during [facility] consultation. Mom reported all questions and concerns were answered, she's aware of LC OP services and will call PRN.    Maternal Data Formula Feeding for Exclusion: No Has patient been taught Hand Expression?: Yes Does the patient have breastfeeding experience prior to this delivery?:  Yes  Feeding Feeding Type: (P) Breast Milk Nipple Type: (P) Slow - flow   Interventions Interventions: Breast feeding basics reviewed  Lactation Tools Discussed/Used Tools: Pump Breast pump type: Double-Electric Breast Pump WIC Program: No Pump Review: Setup, frequency, and cleaning;Milk Storage Initiated by:: RN and MPeck Date initiated:: 11/21/18   Consult Status Consult Status: PRN Date: 11/22/18 Follow-up type: In-patient    Koji Niehoff Venetia Constable 11/21/2018, 3:39 PM

## 2018-11-21 NOTE — Progress Notes (Signed)
Hgb dropped from 11.3 to 6.3.  Pt has denied dizziness multiple times, and has done a great deal of walking.  IV iron infusion was ordered this a.m.  RN provided a printout re: this medication, and verbal information re: need for med, etc.  Prior to receiving infusion pt wished to take a shower, go see the birth registrar with the FOB, and go see her infant in NICU.   When the patient returned to her room she notified the RN.  The IV flushed well, but when the maintenance fluid was started, it began leaking and showed signs of infiltration.  The IV was removed and RN discussed the insertion of another IV.  Pt refused.  She states it is too difficult to start one on her, as she has small veins.  CNM notified.  See new order.  Delice Bison Leaira Fullam Charity fundraiser

## 2018-11-21 NOTE — Lactation Note (Signed)
This note was copied from a baby's chart. Lactation Consultation Note  Patient Name: Marisa Stephens [id number] Date: 11/21/2018   Attempted to visit with mom, but she and dad were both asleep. LC to come back later to do lactation assessment.  Maternal Data    Feeding Feeding Type: Formula Nipple Type: Slow - flow  LATCH Score                   Interventions    Lactation Tools Discussed/Used     Consult Status      [name] 11/21/2018, 2:06 PM

## 2018-11-22 MED ORDER — OXYCODONE-ACETAMINOPHEN 5-325 MG PO TABS: 2.0000 | ORAL_TABLET | ORAL | 0 refills | Status: DC | PRN

## 2018-11-22 MED ORDER — IBUPROFEN 800 MG PO TABS: 800.0000 mg | ORAL_TABLET | Freq: Three times a day (TID) | ORAL | 1 refills | Status: DC | PRN

## 2018-11-22 MED ORDER — SENNOSIDES-DOCUSATE SODIUM 8.6-50 MG PO TABS: 2.0000 | ORAL_TABLET | ORAL | 0 refills | Status: DC

## 2018-11-22 NOTE — Lactation Note (Signed)
This note was copied from a baby's chart. Lactation Consultation Note  Patient Name: Boy Cheral Neyens [id number] Date: 11/22/2018 Reason for consult: Follow-up assessment;Term;NICU baby(mom is D/C and per mom baby is going home too ) Baby is 53 hours old  Mom is and experienced breast feeder  Per mom milk is in and I've been breast feeding when down in NICU , baby receiving  EBM and formula.  Per mom baby is also going home today and feels good about the latching.  Mom denies soreness , sore nipple and engorgement prevention and tx reviewed.  Mom aware of the resources for [facility] - Lactation services.  Per mom has a pump at home.  Maternal Data    Feeding Feeding Type: Formula Nipple Type: Slow - flow  LATCH Score                   Interventions Interventions: Breast feeding basics reviewed;DEBP  Lactation Tools Discussed/Used Tools: Pump;Flanges(breast feeding and pumping ) Flange Size: 24;27 Breast pump type: Double-Electric Breast Pump Pump Review: Milk Storage   Consult Status Consult Status: Complete Date: 11/22/18    Kathrin Greathouse 11/22/2018, 3:27 PM

## 2018-11-22 NOTE — Discharge Instructions (Signed)

## 2018-11-22 NOTE — Progress Notes (Signed)
Subjective: Postpartum Day 2: Cesarean Delivery Patient reports incisional pain, tolerating PO and no problems voiding.                            States has no dizziness when up and about                           Baby in NICU "for fluid on his lungs",  Objective: Vital signs in last 24 hours: Temp:  [98.1 F (36.7 C)-98.4 F (36.9 C)] 98.2 F (36.8 C) (05/26 0604) Pulse Rate:  [96-98] 96 (05/26 0604) Resp:  [18-20] 18 (05/26 0604) BP: (107-120)/(61-79) 120/79 (05/26 0604) SpO2:  [100 %] 100 % (05/25 2300)  Physical Exam:  General: alert, cooperative and no distress Lochia: appropriate Uterine Fundus: firm Incision: healing well, no significant drainage DVT Evaluation: No evidence of DVT seen on physical exam.  Recent Labs    11/21/18 0454 11/21/18 1822  HGB 6.3* 6.9*  HCT 19.1* 20.8*    Assessment/Plan: Status post Cesarean section. Doing well postoperatively.  Continue current care Discharge home tomorrow Has IUD (placed in OR) Baby in NICU "for fluid on his lungs", hopefully discharged tomorrow.  Wynelle Bourgeois 11/22/2018, 10:16 AM

## 2018-11-28 ENCOUNTER — Encounter: Payer: Medicaid Other | Admitting: Obstetrics and Gynecology

## 2018-12-05 ENCOUNTER — Ambulatory Visit: Payer: Medicaid Other | Admitting: Obstetrics and Gynecology

## 2018-12-05 ENCOUNTER — Other Ambulatory Visit: Payer: Self-pay

## 2018-12-08 ENCOUNTER — Ambulatory Visit (INDEPENDENT_AMBULATORY_CARE_PROVIDER_SITE_OTHER): Payer: Medicaid Other | Admitting: Advanced Practice Midwife

## 2018-12-08 ENCOUNTER — Other Ambulatory Visit: Payer: Self-pay

## 2018-12-08 VITALS — BP 124/86 | HR 90 | Temp 99.3°F | Wt 146.0 lb

## 2018-12-08 DIAGNOSIS — Z4889 Encounter for other specified surgical aftercare: Secondary | ICD-10-CM

## 2018-12-08 NOTE — Progress Notes (Signed)
   POST-OP INCISION CHECK  Subjective:  Marisa Stephens is a 28 y.o. [phone] at [redacted]w[redacted]d being seen today for incision check following LTCS on 11/20/18 which included placement of a Liletta IUD in the operating room.   Patient reports tenderness to palpation along her incision. She also reports occasional episodes of "quick and sharp" pain with bending and lifting. She is taking Ibuprofen 800 mg q 8 hours as prescribed but is not experiencing her desired level of pain relief.  The following portions of the patient's history were reviewed and updated as appropriate: allergies, current medications, past family history, past medical history, past social history, past surgical history and problem list. Problem list updated.  Objective:   Vitals:   12/08/18 1521  BP: 124/86  Pulse: 90  Temp: 99.3 F (37.4 C)  TempSrc: Oral  Weight: 146 lb (66.2 kg)    Assessment and Plan:  Pregnancy: [id number] at [redacted]w[redacted]d 1. Encounter for post-surgical wound check --Pfannenstiel incision is thin, flat, well-approximated. No drainage,bruising or swelling noted --Healing extremely well, reassurance provided to patient --Recommended patient reintroduce Tylenol 650 mg q 4 hours to complement intervals between Ibuprofen dosing.  Follow-up: Postpartum appointment already scheduled   Future Appointments  Date Time Provider [place]  12/20/2018  9:30 AM Donnamae Jude, MD [place] None   Total visit time: 10 minutes. Greater than 50%of face-to-face visit spent in counseling and coordination of care. Darlina Rumpf, CNM

## 2018-12-08 NOTE — Progress Notes (Signed)
2 week post partum  Delivered  11/20/18 C-Section Breastfeeding.   CC: pain at incision area , sometimes 10/10x. Pt notes feeling as if a "knot" is on the left side. Pt denies any drainage or odor at the area.  Pt states she does not get any relief w/ IBP 800mg .

## 2018-12-20 ENCOUNTER — Ambulatory Visit (INDEPENDENT_AMBULATORY_CARE_PROVIDER_SITE_OTHER): Payer: Medicaid Other | Admitting: Family Medicine

## 2018-12-20 ENCOUNTER — Encounter: Payer: Self-pay | Admitting: Family Medicine

## 2018-12-20 ENCOUNTER — Other Ambulatory Visit: Payer: Self-pay

## 2018-12-20 DIAGNOSIS — Z1389 Encounter for screening for other disorder: Secondary | ICD-10-CM

## 2018-12-20 NOTE — Progress Notes (Signed)
[place] Partum Exam  Marisa Stephens is a 28 y.o. [phone] female who presents for a postpartum visit. She is 4 weeks postpartum following a low cervical transverse Cesarean section. I have fully reviewed the prenatal and intrapartum course. The delivery was at 71 gestational weeks.  Anesthesia: epidural. Postpartum course has been marked for high blood pressure and occasional headaches since delivery. Baby's course has been unremarkable. Baby is feeding by bottle - Similac Advance. Bleeding no bleeding. Bowel function is normal. Bladder function is normal. Patient is not sexually active. Contraception method is IUD. Postpartum depression screening:neg (score 3) I have independently verified all of the above information.  The following portions of the patient's history were reviewed and updated as appropriate: allergies, current medications, past family history, past medical history, past social history, past surgical history and problem list. Last pap smear done 10/19/16 and was Normal  Review of Systems Pertinent items noted in HPI and remainder of comprehensive ROS otherwise negative.    Objective:  currently breastfeeding.  General:  alert, cooperative and appears stated age  Lungs: normal effort  Heart:  regular rate and rhythm  Abdomen: soft, non-tender; bowel sounds normal; no masses,  no organomegaly   Vulva:  normal  Vagina: normal vagina  Cervix:  multiparous appearance and IUD strings are long and curled around cervix, trimmed to 3 cm.        Assessment:    Normal postpartum exam. Pap smear not done at today's visit.   Plan:   1. Contraception: IUD 2. For CPE with pap in 3-6 months.

## 2019-05-01 ENCOUNTER — Encounter: Payer: Self-pay | Admitting: Advanced Practice Midwife

## 2019-05-01 DIAGNOSIS — Z975 Presence of (intrauterine) contraceptive device: Secondary | ICD-10-CM | POA: Insufficient documentation

## 2019-11-27 ENCOUNTER — Emergency Department (HOSPITAL_COMMUNITY)
Admission: EM | Admit: 2019-11-27 | Discharge: 2019-11-27 | Disposition: A | Discharge status: Home / Self Care | Payer: Medicaid Other | Attending: Emergency Medicine | Admitting: Emergency Medicine

## 2019-11-27 ENCOUNTER — Other Ambulatory Visit: Payer: Self-pay

## 2019-11-27 ENCOUNTER — Encounter (HOSPITAL_COMMUNITY): Payer: Self-pay

## 2019-11-27 DIAGNOSIS — K029 Dental caries, unspecified: Secondary | ICD-10-CM | POA: Insufficient documentation

## 2019-11-27 DIAGNOSIS — G8929 Other chronic pain: Secondary | ICD-10-CM | POA: Insufficient documentation

## 2019-11-27 DIAGNOSIS — Z87891 Personal history of nicotine dependence: Secondary | ICD-10-CM | POA: Insufficient documentation

## 2019-11-27 DIAGNOSIS — K0889 Other specified disorders of teeth and supporting structures: Secondary | ICD-10-CM | POA: Diagnosis not present

## 2019-11-27 MED ORDER — PENICILLIN V POTASSIUM 500 MG PO TABS: 500.0000 mg | ORAL_TABLET | Freq: Four times a day (QID) | ORAL | 0 refills | Status: AC

## 2019-11-27 MED ORDER — HYDROCODONE-ACETAMINOPHEN 5-325 MG PO TABS: 1.0000 | ORAL_TABLET | ORAL | 0 refills | Status: DC | PRN

## 2019-11-27 NOTE — Discharge Instructions (Signed)
The pain in your tooth is likely caused by infection, from a large cavity in it.  We are prescribing an antibiotic to help.  You will need to see a dentist, for treatment, and possibly an oral surgeon for dental extraction.  We are prescribing antibiotics to treat infection and a pain reliever to take if needed for severe pain.  Do not drive or work within 6 hours of taking the narcotic pain reliever, hydrocodone.  It is safe to drive and work with him 6 hours of taking an over-the-counter pain reliever such as Tylenol or Motrin.  [place] does not have a oral surgeon for referral to, today.  You can find an oral surgeon to see, by searching for it, on a [employer].

## 2019-11-27 NOTE — ED Provider Notes (Signed)
[facility] EMERGENCY DEPARTMENT Provider Note   CSN: [id number] Arrival date & time: 11/27/19  4098     History No chief complaint on file.   Marisa Stephens is a 29 y.o. female.  HPI She presents for evaluation of a painful broken tooth for 2 days.  Tooth broke several years ago, and she has not had recent dental care.  She has been unable to work today, because of the pain.  She denies fever, chills, nausea, vomiting, weakness or dizziness.  There are no other known modifying factors.    Past Medical History:  Diagnosis Date  . Chronic kidney disease    treated  . Former smoker   . Group beta Strep positive 2016  . Headache    migraines; Tylenol helps  . Hx of chlamydia infection 2010   treated  . 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 Post term pregnancy at [redacted] weeks gestation 05/10/2017  . Supervision of high risk pregnancy, antepartum 10/16/2016    [facility] Prenatal Labs Dating  LMP 07/05/16 Blood type: O/Positive/-- (04/23 1617)  Genetic Screen AFP:     NIPS:Genome First screen neg Antibody:Negative (04/23 1617) Anatomic [place]  normal, posterior placenta Rubella: 4.15 (04/23 1617) GTT Third trimester: nl 2hr RPR: Non Reactive (08/16 0853)  Flu vaccine 04-08-17 HBsAg: Negative (04/23 1617)  TDaP vaccine  02/11/17                              . Unwanted fertility 04/08/2017    Patient Active Problem List   Diagnosis Date Noted  . IUD (intrauterine device) in place 05/01/2019    Past Surgical History:  Procedure Laterality Date  . CESAREAN SECTION N/A 11/20/2018   Procedure: CESAREAN SECTION;  Surgeon: Osborne Oman, MD;  Location: [facility];  Service: Obstetrics;  Laterality: N/A;  . NO PAST SURGERIES       OB History    Gravida  7   Para  6   Term  5   Preterm  1   AB  1   Living  6     SAB  1   TAB  0   Ectopic  0   Multiple  0   Live Births  6           Family History  Problem Relation Age of Onset  . Diabetes  Maternal Grandmother     Social History   Tobacco Use  . Smoking status: Former Smoker    Packs/day: 0.25    Years: 5.00    Pack years: 1.25    Types: Cigarettes  . Smokeless tobacco: Never Used  Substance Use Topics  . Alcohol use: No  . Drug use: Not Currently    Types: Marijuana    Comment: not using at this moment    Home Medications Prior to Admission medications   Medication Sig Start Date End Date Taking? Authorizing Provider  acetaminophen (TYLENOL) 500 MG tablet Take 1,000 mg by mouth every 8 (eight) hours as needed for mild pain or headache.    [provider]  HYDROcodone-acetaminophen (NORCO/VICODIN) 5-325 MG tablet Take 1 tablet by mouth every 4 (four) hours as needed for moderate pain. 11/27/19   Daleen Bo, MD  ibuprofen (ADVIL) 800 MG tablet Take 1 tablet (800 mg total) by mouth every 8 (eight) hours as needed. 11/22/18   Nicolette Bang, DO  Allergies    Patient has no known allergies.  Review of Systems   Review of Systems  All other systems reviewed and are negative.   Physical Exam Updated Vital Signs BP (!) 144/97 (BP Location: Left Arm)   Pulse 86   Temp 98.9 F (37.2 C) (Oral)   Resp 14   Ht 5\' 1"  (1.549 m)   Wt 54.4 kg   SpO2 100%   BMI 22.67 kg/m   Physical Exam Vitals and nursing note reviewed.  Constitutional:      General: She is in acute distress (She is uncomfortable).     Appearance: She is well-developed. She is not ill-appearing.  HENT:     Head: Normocephalic and atraumatic.     Right Ear: External ear normal.     Left Ear: External ear normal.     Mouth/Throat:     Comments: Right lower molar, with large cavity, medially.  Mild localized erythema and swelling, but no fluctuance or drainage.  No trismus.  No palpable submandibular adenopathy. Eyes:     Conjunctiva/sclera: Conjunctivae normal.     Pupils: Pupils are equal, round, and reactive to light.  Neck:     Trachea: Phonation normal.    Cardiovascular:     Rate and Rhythm: Normal rate.  Pulmonary:     Effort: Pulmonary effort is normal.  Musculoskeletal:        General: Normal range of motion.     Cervical back: Normal range of motion and neck supple.  Skin:    General: Skin is warm and dry.  Neurological:     Mental Status: She is alert and oriented to person, place, and time.     Cranial Nerves: No cranial nerve deficit.     Sensory: No sensory deficit.     Motor: No abnormal muscle tone.     Coordination: Coordination normal.  Psychiatric:        Mood and Affect: Mood normal.        Behavior: Behavior normal.        Thought Content: Thought content normal.        Judgment: Judgment normal.     ED Results / Procedures / Treatments   Labs (all labs ordered are listed, but only abnormal results are displayed) Labs Reviewed - No data to display  EKG None  Radiology No results found.  Procedures Procedures (including critical care time)  Medications Ordered in ED Medications - No data to display  ED Course  I have reviewed the triage vital signs and the nursing notes.  Pertinent labs & imaging results that were available during my care of the patient were reviewed by me and considered in my medical decision making (see chart for details).    MDM Rules/Calculators/A&P                       Patient Vitals for the past 24 hrs:  BP Temp Temp src Pulse Resp SpO2 Height Weight  11/27/19 0853 (!) 144/97 98.9 F (37.2 C) Oral 86 14 100 % 5\' 1"  (1.549 m) 54.4 kg    9:39 AM  Reevaluation with update and discussion. After initial assessment and treatment, an updated evaluation reveals she is comfortable and has no further complaints.  Findings discussed and questions answered. 11/29/19   Medical Decision Making:  This patient is presenting for evaluation of dental pain, which does not require a range of treatment options, and is not a complaint that  involves a high risk of morbidity and  mortality. The differential diagnoses include abscess, osteomyelitis, dental carry. I decided to review old records, and in summary healthy young adult, without multiple visits for dental problems.  I did not require additional historical information from anyone.   Critical Interventions-clinical evaluation, observation, reassessment  After These Interventions, the Patient was reevaluated and was found stable for discharge.  Dental pain, possibly related to infection from chronically open wound, cavity.  No evidence for oral or neck abscess.  Stable for discharge.  She will likely need extraction.  There are no on-call oral surgeons today.  She will be referred to the on-call dentist for assistance with her dental problem  CRITICAL CARE-no Performed by: Mancel Bale  Nursing Notes Reviewed/ Care Coordinated Applicable Imaging Reviewed Interpretation of Laboratory Data incorporated into ED treatment  The patient appears reasonably screened and/or stabilized for discharge and I doubt any other medical condition or other [facility] requiring further screening, evaluation, or treatment in the ED at this time prior to discharge.  Plan: Home Medications-OTC medications of choice; Home Treatments-heat to affected area; return here if the recommended treatment, does not improve the symptoms; Recommended follow up-dental follow-up soon as possible.     Final Clinical Impression(s) / ED Diagnoses Final diagnoses:  Chronic dental pain    Rx / DC Orders ED Discharge Orders         Ordered    HYDROcodone-acetaminophen (NORCO/VICODIN) 5-325 MG tablet  Every 4 hours PRN     11/27/19 4174           Mancel Bale, MD 11/27/19 [phone]

## 2019-11-27 NOTE — ED Triage Notes (Signed)
Patient has right lower broken tooth and reports pain radiating into jaw, broken for some time per patient

## 2019-12-19 ENCOUNTER — Encounter (HOSPITAL_COMMUNITY): Payer: Self-pay | Admitting: Emergency Medicine

## 2019-12-19 ENCOUNTER — Emergency Department (HOSPITAL_COMMUNITY)
Admission: EM | Admit: 2019-12-19 | Discharge: 2019-12-19 | Disposition: A | Discharge status: Home / Self Care | Payer: Medicaid Other | Attending: Emergency Medicine | Admitting: Emergency Medicine

## 2019-12-19 DIAGNOSIS — Z5321 Procedure and treatment not carried out due to patient leaving prior to being seen by health care provider: Secondary | ICD-10-CM | POA: Insufficient documentation

## 2019-12-19 DIAGNOSIS — K0889 Other specified disorders of teeth and supporting structures: Secondary | ICD-10-CM | POA: Diagnosis not present

## 2019-12-19 NOTE — ED Notes (Signed)
Called several times for triage vitals across several hours with no response

## 2019-12-19 NOTE — ED Triage Notes (Signed)
Pt. Stated, Marisa Stephens had a bad tooth for a month. Cant have it pulled for another month.

## 2020-01-08 IMAGING — US US OB < 14 WEEKS - US OB TV
1 series · 15 of 28 positions shown · non-contrast
Comparison: None for this gestation

CLINICAL DATA: Bleeding in early pregnancy, assess viability; EGA 9
weeks 5 days by LMP of 02/02/2018

EXAM:
OBSTETRIC <14 WK US AND TRANSVAGINAL OB US
TECHNIQUE: Both transabdominal and transvaginal ultrasound examinations were
performed for complete evaluation of the gestation as well as the
maternal uterus, adnexal regions, and pelvic cul-de-sac.
Transvaginal technique was performed to assess early pregnancy.

[Series 1: us ob < 14 weeks - us ob tv · 15 of 72 slices shown]
[im 1/72]
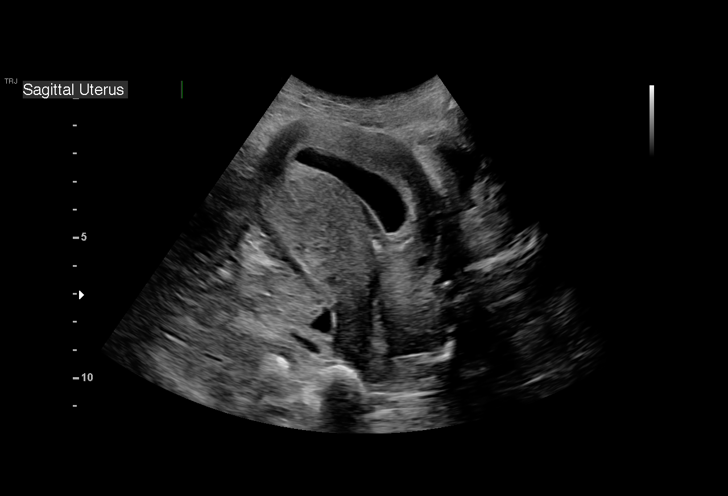
[im 6/72]
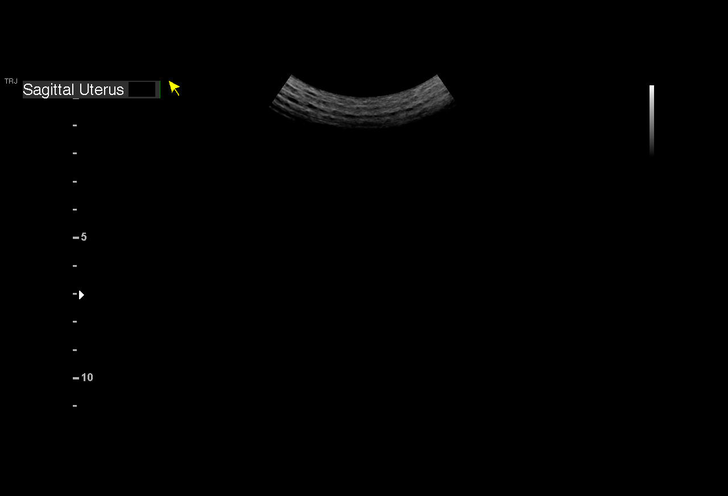
[im 11/72]
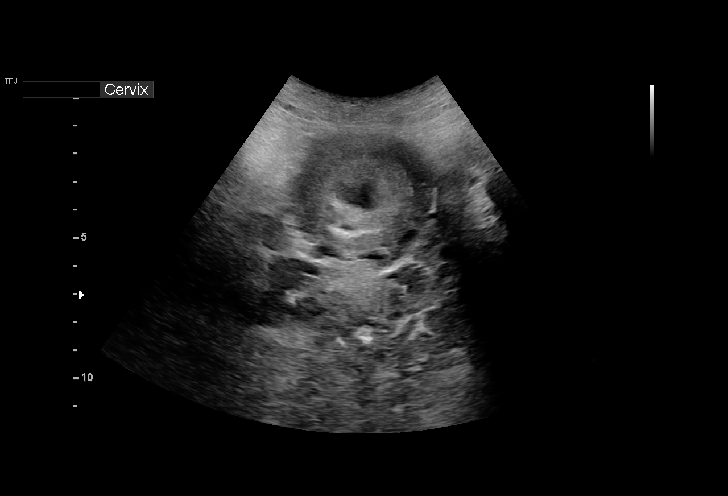
[im 16/72]
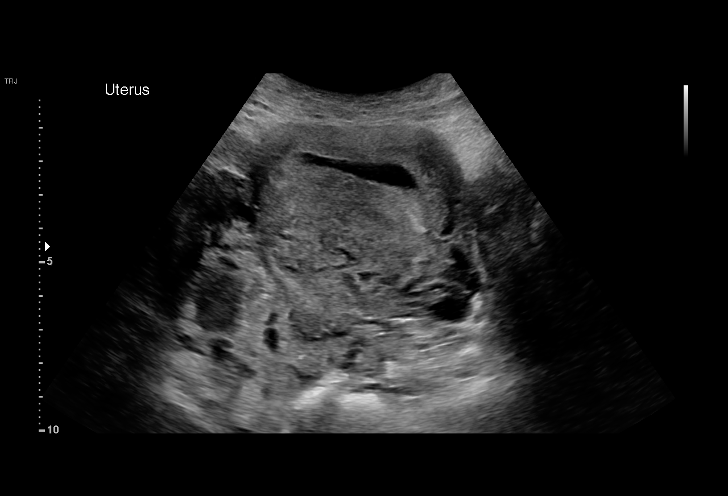
[im 22/72]
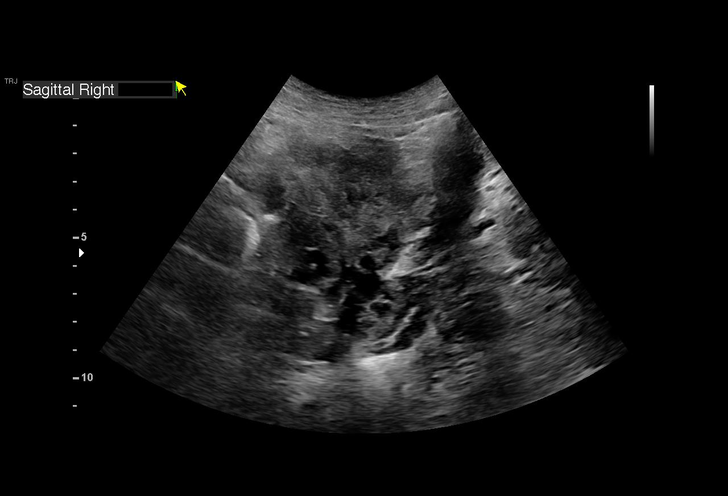
[im 27/72]
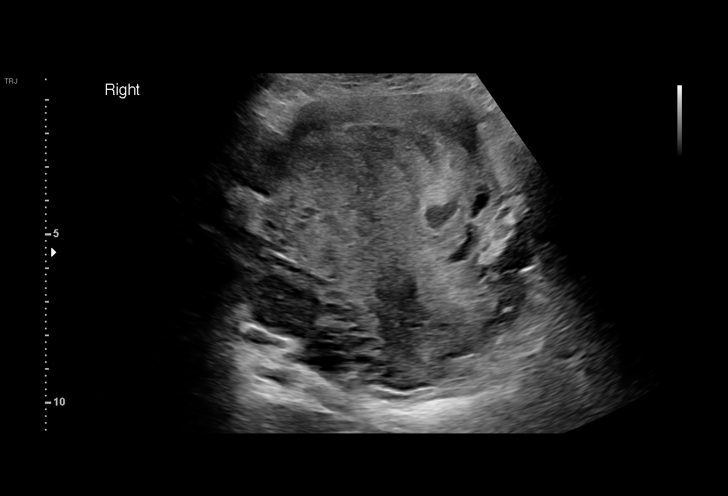
[im 32/72]
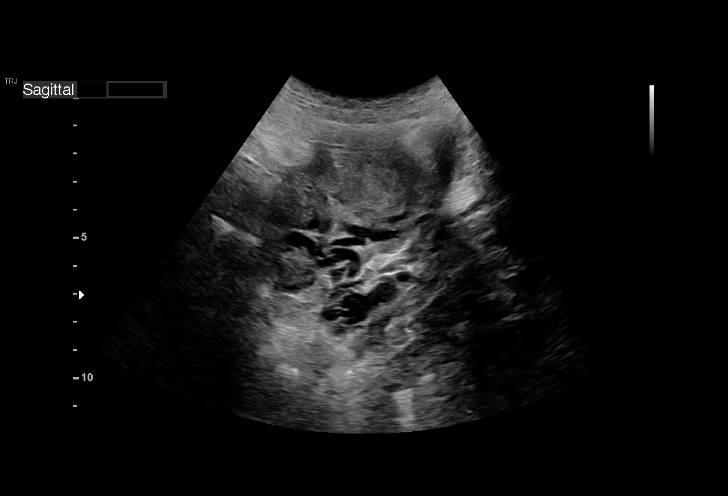
[im 37/72]
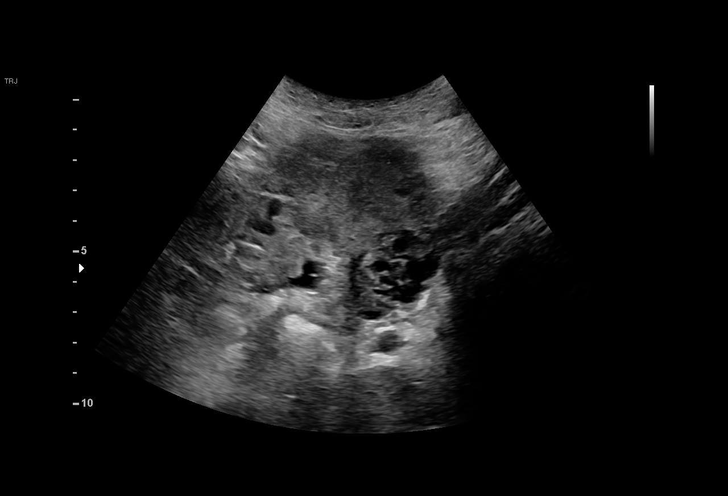
[im 40/72]
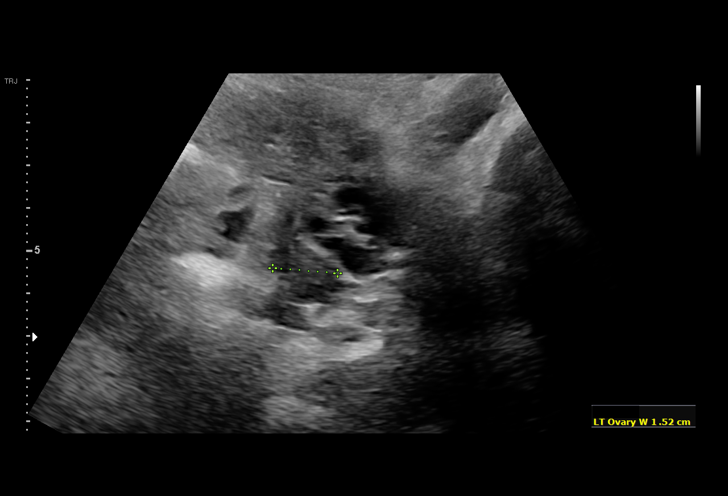
[im 45/72]
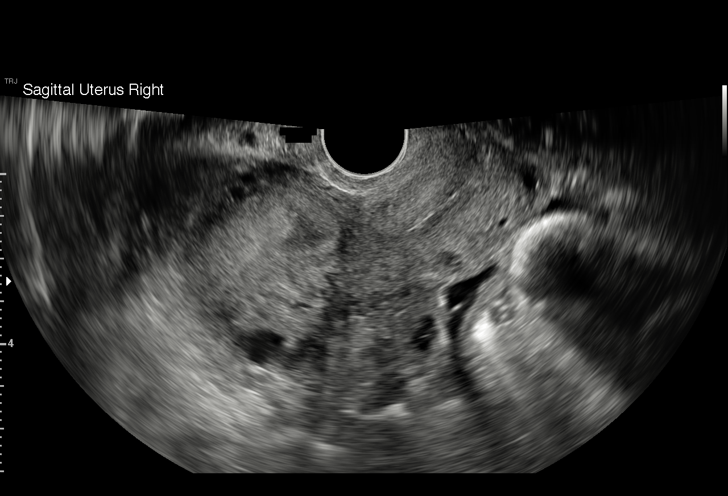
[im 50/72]
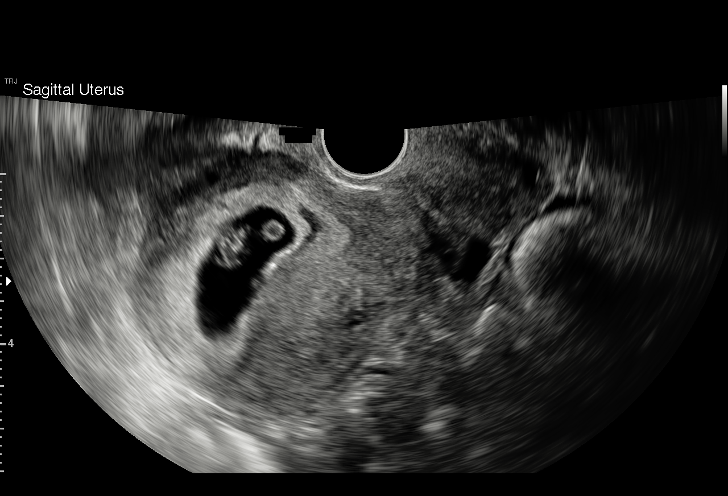
[im 56/72]
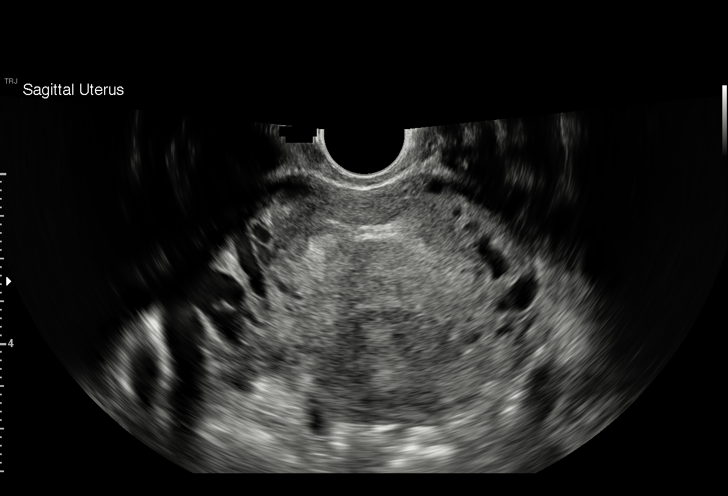
[im 61/72]
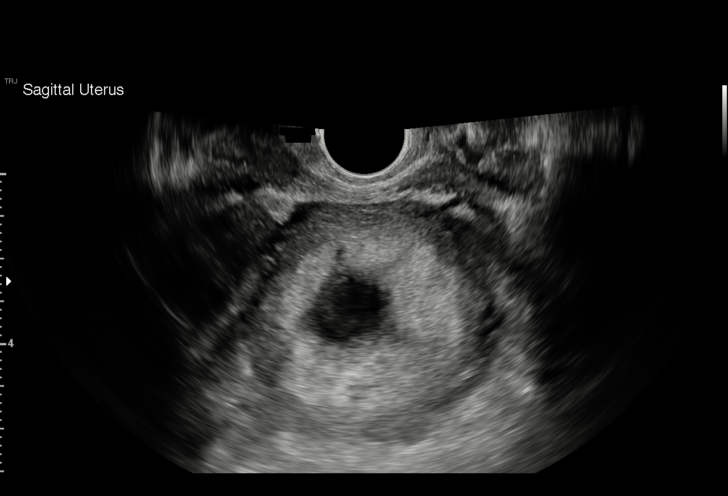
[im 66/72]
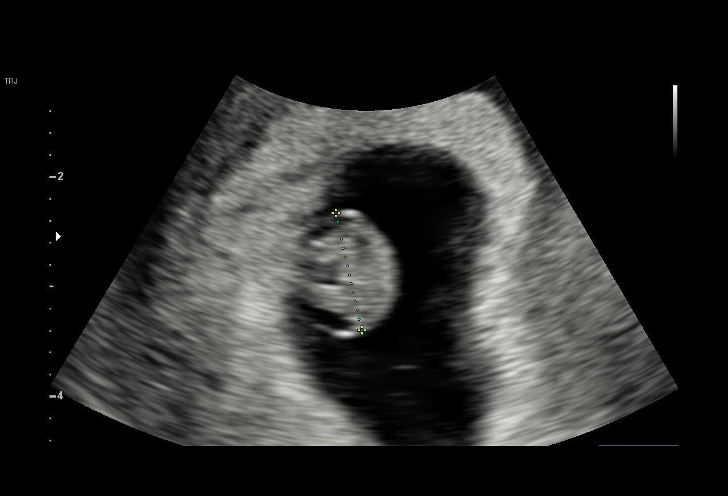
[im 72/72]
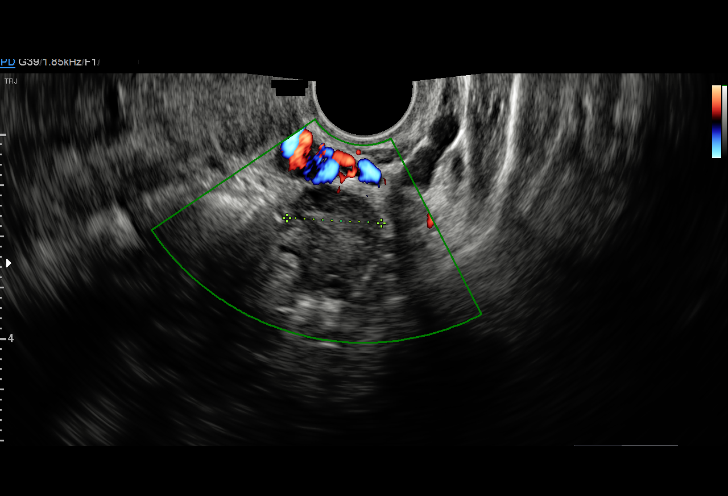

[15 of 28 positions shown; findings below may reference images not displayed]

FINDINGS: Intrauterine gestational sac: Present, single

Yolk sac:  Present

Embryo:  Present

Cardiac Activity: Present

Heart Rate: 140 bpm

CRL:  11.1 mm   7 w   1 d                  US EDC: 11/26/2017

Subchorionic hemorrhage:  None visualized.

Maternal uterus/adnexae:

RIGHT ovary normal size and morphology, 3.4 x 1.9 x 2.6 cm.

LEFT ovary normal size and morphology, 2.5 x 1.7 x 1.7 cm.

No free pelvic fluid or adnexal masses.
IMPRESSION: Single live intrauterine gestation at 7 weeks 1 day EGA by
crown-rump length.

No acute abnormalities.

## 2020-06-23 ENCOUNTER — Other Ambulatory Visit: Payer: Self-pay

## 2020-06-23 ENCOUNTER — Ambulatory Visit (HOSPITAL_COMMUNITY)
Admission: EM | Admit: 2020-06-23 | Discharge: 2020-06-23 | Disposition: A | Discharge status: Home / Self Care | Payer: Medicaid Other | Attending: Emergency Medicine | Admitting: Emergency Medicine

## 2020-06-23 DIAGNOSIS — Z862 Personal history of diseases of the blood and blood-forming organs and certain disorders involving the immune mechanism: Secondary | ICD-10-CM | POA: Diagnosis present

## 2020-06-23 DIAGNOSIS — N760 Acute vaginitis: Secondary | ICD-10-CM | POA: Diagnosis not present

## 2020-06-23 DIAGNOSIS — Z3202 Encounter for pregnancy test, result negative: Secondary | ICD-10-CM

## 2020-06-23 DIAGNOSIS — R531 Weakness: Secondary | ICD-10-CM | POA: Diagnosis present

## 2020-06-23 DIAGNOSIS — R42 Dizziness and giddiness: Secondary | ICD-10-CM

## 2020-06-23 LAB — CBC WITH DIFFERENTIAL/PLATELET
Abs Immature Granulocytes: 0.02 10*3/uL (ref 0.00–0.07)
Basophils Absolute: 0 10*3/uL (ref 0.0–0.1)
Basophils Relative: 0 %
Eosinophils Absolute: 0 10*3/uL (ref 0.0–0.5)
Eosinophils Relative: 0 %
HCT: 44.5 % (ref 36.0–46.0)
Hemoglobin: 15 g/dL (ref 12.0–15.0)
Immature Granulocytes: 0 %
Lymphocytes Relative: 25 %
Lymphs Abs: 1.8 10*3/uL (ref 0.7–4.0)
MCH: 32.3 pg (ref 26.0–34.0)
MCHC: 33.7 g/dL (ref 30.0–36.0)
MCV: 95.9 fL (ref 80.0–100.0)
Monocytes Absolute: 0.5 10*3/uL (ref 0.1–1.0)
Monocytes Relative: 8 %
Neutro Abs: 4.8 10*3/uL (ref 1.7–7.7)
Neutrophils Relative %: 67 %
Platelets: 246 10*3/uL (ref 150–400)
RBC: 4.64 MIL/uL (ref 3.87–5.11)
RDW: 12.3 % (ref 11.5–15.5)
WBC: 7.2 10*3/uL (ref 4.0–10.5)
nRBC: 0 % (ref 0.0–0.2)

## 2020-06-23 LAB — POC URINE PREG, ED: Preg Test, Ur: NEGATIVE

## 2020-06-23 MED ORDER — FLUCONAZOLE 150 MG PO TABS: 150.0000 mg | ORAL_TABLET | ORAL | 0 refills | Status: DC

## 2020-06-23 NOTE — ED Provider Notes (Signed)
[facility]   MRN: [id number] DOB: 31-Aug-1990  Subjective:   Marisa Stephens is a 29 y.o. female presenting for 1 day history of acute onset genital irritation, vaginal redness and itching.  Patient is sexually active with her boyfriend, does not use condoms for protection.  He has previously given her an STI from cheating.  Denies fever, chest pain, shortness of breath.  She does admit that she has a history of anemia but is not taking any supplements.  Has a history of CKD.  Has never needed a blood transfusion.  No current facility-administered medications for this encounter.  Current Outpatient Medications:    acetaminophen (TYLENOL) 500 MG tablet, Take 1,000 mg by mouth every 8 (eight) hours as needed for mild pain or headache., Disp: , Rfl:    HYDROcodone-acetaminophen (NORCO/VICODIN) 5-325 MG tablet, Take 1 tablet by mouth every 4 (four) hours as needed for moderate pain., Disp: 15 tablet, Rfl: 0   ibuprofen (ADVIL) 800 MG tablet, Take 1 tablet (800 mg total) by mouth every 8 (eight) hours as needed., Disp: 60 tablet, Rfl: 1   No Known Allergies  Past Medical History:  Diagnosis Date   Chronic kidney disease    treated   Former smoker    Group beta Strep positive 2016   Headache    migraines; Tylenol helps   Hx of chlamydia infection 2010   treated   Marijuana use 12/06/2014   positive   Post term pregnancy at [redacted] weeks gestation 05/10/2017   Supervision of high risk pregnancy, antepartum 10/16/2016    Clinic [facility] Prenatal Labs Dating  LMP 07/05/16 Blood type: O/Positive/-- (04/23 1617)  Genetic Screen AFP:     NIPS:Genome First screen neg Antibody:Negative (04/23 1617) Anatomic [place]  normal, posterior placenta Rubella: 4.15 (04/23 1617) GTT Third trimester: nl 2hr RPR: Non Reactive (08/16 0853)  Flu vaccine 04-08-17 HBsAg: Negative (04/23 1617)  TDaP vaccine  02/11/17                               Unwanted fertility 04/08/2017     Past Surgical  History:  Procedure Laterality Date   CESAREAN SECTION N/A 11/20/2018   Procedure: CESAREAN SECTION;  Surgeon: Tereso Newcomer, MD;  Location: [facility] LD ORS;  Service: Obstetrics;  Laterality: N/A;   NO PAST SURGERIES      Family History  Problem Relation Age of Onset   Diabetes Maternal Grandmother     Social History   Tobacco Use   Smoking status: Former Smoker    Packs/day: 0.25    Years: 5.00    Pack years: 1.25    Types: Cigarettes   Smokeless tobacco: Never Used  Substance Use Topics   Alcohol use: No   Drug use: Not Currently    Types: Marijuana    Comment: not using at this moment    ROS   Objective:   Vitals: BP 91/63 (BP Location: Right Arm)    Pulse 100    Temp 98.6 F (37 C) (Oral)    Resp 18    Ht 5\' 2"  (1.575 m)    Wt 115 lb (52.2 kg)    LMP  (LMP Unknown)    SpO2 98%    BMI 21.03 kg/m   Physical Exam Constitutional:      General: She is not in acute distress.    Appearance: Normal appearance. She is well-developed. She is  not ill-appearing.  HENT:     Head: Normocephalic and atraumatic.     Nose: Nose normal.     Mouth/Throat:     Mouth: Mucous membranes are moist.     Pharynx: Oropharynx is clear.  Eyes:     General: No scleral icterus.    Extraocular Movements: Extraocular movements intact.     Pupils: Pupils are equal, round, and reactive to light.  Cardiovascular:     Rate and Rhythm: Normal rate.  Pulmonary:     Effort: Pulmonary effort is normal.  Abdominal:     General: Bowel sounds are normal. There is no distension.     Palpations: Abdomen is soft. There is no mass.     Tenderness: There is abdominal tenderness (lower abdomen, mild). There is no right CVA tenderness, left CVA tenderness, guarding or rebound.  Skin:    General: Skin is warm and dry.  Neurological:     General: No focal deficit present.     Mental Status: She is alert and oriented to person, place, and time.  Psychiatric:        Mood and Affect: Mood normal.         Behavior: Behavior normal.        Thought Content: Thought content normal.        Judgment: Judgment normal.     Results for orders placed or performed during the hospital encounter of 06/23/20 (from the past 24 hour(s))  POC urine preg, ED (not at [facility])     Status: None   Collection Time: 06/23/20  4:59 PM  Result Value Ref Range   Preg Test, Ur NEGATIVE NEGATIVE    Assessment and Plan :   PDMP not reviewed this encounter.  1. Vaginitis and vulvovaginitis   2. Dizziness   3. Weakness   4. History of anemia     Vital signs stable, physical exam findings appropriate for outpatient management.  Will cover for yeast vaginitis with Diflucan.  STI testing pending.  Given her history of weakness, dizziness and anemia, will obtain a CBC.  Recommended patient restart iron supplementation, if severe, H&H less than 7 and 30% then please redirect to the emergency room.  Otherwise patient is to follow-up with PCP. Counseled patient on potential for adverse effects with medications prescribed/recommended today, ER and return-to-clinic precautions discussed, patient verbalized understanding.    Wallis Bamberg, [place] 06/23/20 1819

## 2020-06-23 NOTE — ED Triage Notes (Signed)
Pt reports  Diarrhea  That started yesterday and has felt dizzy and weak. Pt has pain and redness in vag area. She has tried OTC for vaginal irritation with out relief.

## 2020-06-24 LAB — CERVICOVAGINAL ANCILLARY ONLY
Bacterial Vaginitis (gardnerella): NEGATIVE
Candida Glabrata: NEGATIVE
Candida Vaginitis: NEGATIVE
Chlamydia: NEGATIVE
Comment: NEGATIVE
Comment: NEGATIVE
Comment: NEGATIVE
Comment: NEGATIVE
Comment: NEGATIVE
Comment: NORMAL
Neisseria Gonorrhea: NEGATIVE
Trichomonas: NEGATIVE

## 2021-01-23 ENCOUNTER — Ambulatory Visit (HOSPITAL_COMMUNITY)
Admission: EM | Admit: 2021-01-23 | Discharge: 2021-01-23 | Disposition: A | Discharge status: Home / Self Care | Payer: Medicaid Other | Attending: Internal Medicine | Admitting: Internal Medicine

## 2021-01-23 ENCOUNTER — Emergency Department (HOSPITAL_COMMUNITY)
Admission: EM | Admit: 2021-01-23 | Discharge: 2021-01-24 | Disposition: A | Discharge status: Home / Self Care | Payer: Medicaid Other | Attending: Emergency Medicine | Admitting: Emergency Medicine

## 2021-01-23 ENCOUNTER — Encounter (HOSPITAL_COMMUNITY): Payer: Self-pay | Admitting: Emergency Medicine

## 2021-01-23 ENCOUNTER — Encounter (HOSPITAL_COMMUNITY): Payer: Self-pay | Admitting: *Deleted

## 2021-01-23 ENCOUNTER — Other Ambulatory Visit: Payer: Self-pay

## 2021-01-23 DIAGNOSIS — D72829 Elevated white blood cell count, unspecified: Secondary | ICD-10-CM | POA: Insufficient documentation

## 2021-01-23 DIAGNOSIS — F17210 Nicotine dependence, cigarettes, uncomplicated: Secondary | ICD-10-CM | POA: Diagnosis not present

## 2021-01-23 DIAGNOSIS — R1084 Generalized abdominal pain: Secondary | ICD-10-CM

## 2021-01-23 DIAGNOSIS — R1030 Lower abdominal pain, unspecified: Secondary | ICD-10-CM | POA: Diagnosis not present

## 2021-01-23 DIAGNOSIS — N3001 Acute cystitis with hematuria: Secondary | ICD-10-CM

## 2021-01-23 DIAGNOSIS — A0471 Enterocolitis due to Clostridium difficile, recurrent: Secondary | ICD-10-CM | POA: Insufficient documentation

## 2021-01-23 DIAGNOSIS — N189 Chronic kidney disease, unspecified: Secondary | ICD-10-CM | POA: Diagnosis not present

## 2021-01-23 DIAGNOSIS — K529 Noninfective gastroenteritis and colitis, unspecified: Secondary | ICD-10-CM

## 2021-01-23 LAB — URINALYSIS, ROUTINE W REFLEX MICROSCOPIC
Bilirubin Urine: NEGATIVE
Glucose, UA: NEGATIVE mg/dL
Ketones, ur: 20 mg/dL — AB
Nitrite: NEGATIVE
Protein, ur: 100 mg/dL — AB
Specific Gravity, Urine: 1.029 (ref 1.005–1.030)
WBC, UA: >50 WBC/hpf — ABNORMAL HIGH (ref 0–5)
pH: 6 (ref 5.0–8.0)

## 2021-01-23 LAB — POCT URINALYSIS DIPSTICK, ED / UC
Glucose, UA: NEGATIVE mg/dL
Nitrite: POSITIVE — AB
Protein, ur: 100 mg/dL — AB
Specific Gravity, Urine: 1.02 (ref 1.005–1.030)
Urobilinogen, UA: 4 mg/dL — ABNORMAL HIGH (ref 0.0–1.0)
pH: 6.5 (ref 5.0–8.0)

## 2021-01-23 LAB — CBC WITH DIFFERENTIAL/PLATELET
Abs Immature Granulocytes: 0.05 10*3/uL (ref 0.00–0.07)
Basophils Absolute: 0 10*3/uL (ref 0.0–0.1)
Basophils Relative: 0 %
Eosinophils Absolute: 0.1 10*3/uL (ref 0.0–0.5)
Eosinophils Relative: 1 %
HCT: 37.1 % (ref 36.0–46.0)
Hemoglobin: 12.8 g/dL (ref 12.0–15.0)
Immature Granulocytes: 0 %
Lymphocytes Relative: 11 %
Lymphs Abs: 1.6 10*3/uL (ref 0.7–4.0)
MCH: 33.8 pg (ref 26.0–34.0)
MCHC: 34.5 g/dL (ref 30.0–36.0)
MCV: 97.9 fL (ref 80.0–100.0)
Monocytes Absolute: 0.7 10*3/uL (ref 0.1–1.0)
Monocytes Relative: 5 %
Neutro Abs: 11.3 10*3/uL — ABNORMAL HIGH (ref 1.7–7.7)
Neutrophils Relative %: 83 %
Platelets: 216 10*3/uL (ref 150–400)
RBC: 3.79 MIL/uL — ABNORMAL LOW (ref 3.87–5.11)
RDW: 12 % (ref 11.5–15.5)
WBC: 13.7 10*3/uL — ABNORMAL HIGH (ref 4.0–10.5)
nRBC: 0 % (ref 0.0–0.2)

## 2021-01-23 LAB — COMPREHENSIVE METABOLIC PANEL
ALT: 12 U/L (ref 0–44)
AST: 14 U/L — ABNORMAL LOW (ref 15–41)
Albumin: 4.2 g/dL (ref 3.5–5.0)
Alkaline Phosphatase: 66 U/L (ref 38–126)
Anion gap: 7 (ref 5–15)
BUN: 11 mg/dL (ref 6–20)
CO2: 25 mmol/L (ref 22–32)
Calcium: 9.3 mg/dL (ref 8.9–10.3)
Chloride: 105 mmol/L (ref 98–111)
Creatinine, Ser: 0.81 mg/dL (ref 0.44–1.00)
GFR, Estimated: >60 mL/min (ref >60)
Glucose, Bld: 92 mg/dL (ref 70–99)
Potassium: 3.5 mmol/L (ref 3.5–5.1)
Sodium: 137 mmol/L (ref 135–145)
Total Bilirubin: 2.1 mg/dL — ABNORMAL HIGH (ref 0.3–1.2)
Total Protein: 7.1 g/dL (ref 6.5–8.1)

## 2021-01-23 LAB — PREGNANCY, URINE: Preg Test, Ur: NEGATIVE

## 2021-01-23 LAB — POC URINE PREG, ED: Preg Test, Ur: NEGATIVE

## 2021-01-23 LAB — LIPASE, BLOOD: Lipase: 21 U/L (ref 11–51)

## 2021-01-23 MED ORDER — IBUPROFEN 400 MG PO TABS
400.0000 mg | ORAL_TABLET | Freq: Once | ORAL | Status: AC | PRN
  Administered 2021-01-24: 400 mg via ORAL
  Filled 2021-01-23: qty 1

## 2021-01-23 NOTE — ED Notes (Signed)
Patient is being discharged from the Urgent Care and sent to the Emergency Department via POV . Per Chales Salmon NP, patient is in need of higher level of care due to severe abdominal pain and fever. Patient is aware and verbalizes understanding of plan of care.  Vitals:   01/23/21 1750  BP: 114/72  Pulse: (!) 103  Resp: 16  Temp: (!) 100.7 F (38.2 C)  SpO2: 100%

## 2021-01-23 NOTE — ED Triage Notes (Signed)
Pt c/o abdominal pain and nausea that started today. C/o urinary frequency, denies vaginal discharge, reports "light pink" bleeding x 3 days, states she has an IUD.

## 2021-01-23 NOTE — ED Provider Notes (Signed)
Emergency Medicine Provider Triage Evaluation Note  Marisa Stephens , a 30 y.o. female  was evaluated in triage.  Pt complains of abdominal pain, the middle of her stomach, does not radiate, not associate with nausea, no constipation, diarrhea, no urinary symptoms.  No abdominal history  Review of Systems  Positive: Abdominal pain, nausea Negative: Constipation, diarrhea  Physical Exam  BP 118/68 (BP Location: Right Arm)   Pulse 99   Temp 100.1 F (37.8 C) (Oral)   Resp 18   LMP 01/20/2021 (Approximate)   SpO2 100%  Gen:   Awake, no distress   Resp:  Normal effort  MSK:   Moves extremities without difficulty  Other:  No guarding, rebound tenderness, peritoneal sign.  Medical Decision Making  Medically screening exam initiated at 7:05 PM.  Appropriate orders placed.  BURGANDY HACKWORTH was informed that the remainder of the evaluation will be completed by another provider, this initial triage assessment does not replace that evaluation, and the importance of remaining in the ED until their evaluation is complete.  Presents with abdominal pain patient will  need further work-up in emergency department.   Carroll Sage, PA-C 01/23/21 Nicholos Johns    Mancel Bale, MD 01/24/21 [phone]

## 2021-01-23 NOTE — ED Provider Notes (Signed)
[facility]    CSN: [id number] Arrival date & time: 01/23/21  1720      History   Chief Complaint Chief Complaint  Patient presents with   Abdominal Pain    HPI Marisa Stephens is a 30 y.o. female.   Patient here for evaluation of lower abdominal pain that started suddenly this morning.  Denies any nausea, vomiting, or diarrhea.  Reports a light pink discharge or spotting that has been ongoing for the past 3 days.  Patient does have IUD.  Reports pain worse with movement and walking.  Has not taken any OTC medications or treatments.     Abdominal Pain  Past Medical History:  Diagnosis Date   Chronic kidney disease    treated   Former smoker    Group beta Strep positive 2016   Headache    migraines; Tylenol helps   Hx of chlamydia infection 2010   treated   Marijuana use 12/06/2014   positive   Post term pregnancy at [redacted] weeks gestation 05/10/2017   Supervision of high risk pregnancy, antepartum 10/16/2016    Clinic [facility] Prenatal Labs Dating  LMP 07/05/16 Blood type: O/Positive/-- (04/23 1617)  Genetic Screen AFP:     NIPS:Genome First screen neg Antibody:Negative (04/23 1617) Anatomic [place]  normal, posterior placenta Rubella: 4.15 (04/23 1617) GTT Third trimester: nl 2hr RPR: Non Reactive (08/16 0853)  Flu vaccine 04-08-17 HBsAg: Negative (04/23 1617)  TDaP vaccine  02/11/17                               Unwanted fertility 04/08/2017    Patient Active Problem List   Diagnosis Date Noted   IUD (intrauterine device) in place 05/01/2019    Past Surgical History:  Procedure Laterality Date   CESAREAN SECTION N/A 11/20/2018   Procedure: CESAREAN SECTION;  Surgeon: Tereso Newcomer, MD;  Location: [facility] LD ORS;  Service: Obstetrics;  Laterality: N/A;    OB History     Gravida  7   Para  6   Term  5   Preterm  1   AB  1   Living  6      SAB  1   IAB  0   Ectopic  0   Multiple  0   Live Births  6            Home Medications    Prior  to Admission medications   Medication Sig Start Date End Date Taking? Authorizing Provider  acetaminophen (TYLENOL) 500 MG tablet Take 1,000 mg by mouth every 8 (eight) hours as needed for mild pain or headache.    [provider]  fluconazole (DIFLUCAN) 150 MG tablet Take 1 tablet (150 mg total) by mouth once a week. 06/23/20   Wallis Bamberg, PA-C  HYDROcodone-acetaminophen (NORCO/VICODIN) 5-325 MG tablet Take 1 tablet by mouth every 4 (four) hours as needed for moderate pain. 11/27/19   Mancel Bale, MD  ibuprofen (ADVIL) 800 MG tablet Take 1 tablet (800 mg total) by mouth every 8 (eight) hours as needed. 11/22/18   Arvilla Market, MD    Family History Family History  Problem Relation Age of Onset   Diabetes Maternal Grandmother     Social History Social History   Tobacco Use   Smoking status: Every Day    Packs/day: 0.25    Years: 5.00    Pack years: 1.25  Types: Cigarettes   Smokeless tobacco: Never  Vaping Use   Vaping Use: Former  Substance Use Topics   Alcohol use: Yes    Comment: occasionally   Drug use: Yes    Types: Marijuana    Comment: not using at this moment     Allergies   Patient has no known allergies.   Review of Systems Review of Systems  Gastrointestinal:  Positive for abdominal pain.  All other systems reviewed and are negative.   Physical Exam Triage Vital Signs ED Triage Vitals [01/23/21 1750]  Enc Vitals Group     BP 114/72     Pulse Rate (!) 103     Resp 16     Temp (!) 100.7 F (38.2 C)     Temp Source Oral     SpO2 100 %     Weight      Height      Head Circumference      Peak Flow      Pain Score 10     Pain Loc      Pain Edu?      Excl. in GC?    No data found.  Updated Vital Signs BP 114/72   Pulse (!) 103   Temp (!) 100.7 F (38.2 C) (Oral)   Resp 16   LMP 01/20/2021 (Approximate)   SpO2 100%   Breastfeeding No   Visual Acuity Right Eye Distance:   Left Eye Distance:   Bilateral  Distance:    Right Eye Near:   Left Eye Near:    Bilateral Near:     Physical Exam Abdominal:     Tenderness: There is abdominal tenderness in the right lower quadrant, suprapubic area and left lower quadrant. There is guarding.     UC Treatments / Results  Labs (all labs ordered are listed, but only abnormal results are displayed) Labs Reviewed  POCT URINALYSIS DIPSTICK, ED / UC - Abnormal; Notable for the following components:      Result Value   Bilirubin Urine SMALL (*)    Ketones, ur TRACE (*)    Hgb urine dipstick SMALL (*)    Protein, ur 100 (*)    Urobilinogen, UA 4.0 (*)    Nitrite POSITIVE (*)    Leukocytes,Ua LARGE (*)    All other components within normal limits  POC URINE PREG, ED    EKG   Radiology No results found.  Procedures Procedures (including critical care time)  Medications Ordered in UC Medications - No data to display  Initial Impression / Assessment and Plan / UC Course  I have reviewed the triage vital signs and the nursing notes.  Pertinent labs & imaging results that were available during my care of the patient were reviewed by me and considered in my medical decision making (see chart for details).    Unable to fully evaluate patient due to pain.  Urine pregnancy test was negative.  Recommended that patient go to the emergency room for further evaluation for possible appendicitis, diverticulitis, PID, ovarian torsion, or ovarian cyst. Final Clinical Impressions(s) / UC Diagnoses   Final diagnoses:  None   Discharge Instructions   None    ED Prescriptions   None    PDMP not reviewed this encounter.   Ivette Loyal, NP 01/23/21 1820

## 2021-01-23 NOTE — ED Triage Notes (Signed)
C/O constant low abd pain onset this AM.  Denies n/v/d.  Denies fevers. Denies vaginal discharge.  Reports "light pink" bleeding x approx 3 days.  States has IUD in place. Pain worse with walking.

## 2021-01-23 NOTE — ED Notes (Addendum)
Pts stood up out of chair and decided to lay down in floor. NTs attempted to assist pt back into chair but pt refusing and insists she needs to stay on the floor because it's "more comfortable". NT eventually got pt back into chair.

## 2021-01-24 ENCOUNTER — Emergency Department (HOSPITAL_COMMUNITY): Payer: Medicaid Other

## 2021-01-24 MED ORDER — IOHEXOL 300 MG/ML  SOLN
100.0000 mL | Freq: Once | INTRAMUSCULAR | Status: AC | PRN
  Administered 2021-01-24: 100 mL via INTRAVENOUS

## 2021-01-24 MED ORDER — SODIUM CHLORIDE 0.9 % IV BOLUS
1000.0000 mL | Freq: Once | INTRAVENOUS | Status: AC
Start: 2021-01-24 — End: 2021-01-24
  Administered 2021-01-24: 1000 mL via INTRAVENOUS

## 2021-01-24 MED ORDER — SODIUM CHLORIDE 0.9 % IV SOLN
1.0000 g | Freq: Once | INTRAVENOUS | Status: AC
  Administered 2021-01-24: 1 g via INTRAVENOUS
  Filled 2021-01-24: qty 10

## 2021-01-24 MED ORDER — ONDANSETRON 4 MG PO TBDP: 4.0000 mg | ORAL_TABLET | Freq: Three times a day (TID) | ORAL | 0 refills | Status: DC | PRN

## 2021-01-24 MED ORDER — CEPHALEXIN 500 MG PO CAPS: 500.0000 mg | ORAL_CAPSULE | Freq: Four times a day (QID) | ORAL | 0 refills | Status: DC

## 2021-01-24 NOTE — ED Notes (Signed)
Patient verbalizes understanding of discharge instructions. Prescriptions and pain management reviewed. Opportunity for questioning and answers were provided. Armband removed by staff, pt discharged from ED ambulatory. Pt provided with paper scrubs due to own clothing soiled.

## 2021-01-24 NOTE — ED Notes (Signed)
Pt ambulatory to the restroom.  

## 2021-01-24 NOTE — ED Provider Notes (Signed)
[facility] EMERGENCY DEPARTMENT Provider Note   CSN: [id number] Arrival date & time: 01/23/21  1820     History Chief Complaint  Patient presents with   Abdominal Pain    Marisa Stephens is a 30 y.o. female.  Patient to ED for evaluation of diffuse abdominal pain that started yesterday. She reports nausea but denies vomiting and denies diarrhea. No urinary symptoms, cough, congestion, bloating. No sick contacts. She does not believe she has come into contact with contaminated food. No history of recurrent similar symptoms. She denies vaginal discharge or pelvic pain.  The history is provided by the patient. No language interpreter was used.  Abdominal Pain Associated symptoms: nausea   Associated symptoms: no chills and no fever       Past Medical History:  Diagnosis Date   Chronic kidney disease    treated   Former smoker    Group beta Strep positive 2016   Headache    migraines; Tylenol helps   Hx of chlamydia infection 2010   treated   Marijuana use 12/06/2014   positive   Post term pregnancy at [redacted] weeks gestation 05/10/2017   Supervision of high risk pregnancy, antepartum 10/16/2016    Clinic [facility] Prenatal Labs Dating  LMP 07/05/16 Blood type: O/Positive/-- (04/23 1617)  Genetic Screen AFP:     NIPS:Genome First screen neg Antibody:Negative (04/23 1617) Anatomic [place]  normal, posterior placenta Rubella: 4.15 (04/23 1617) GTT Third trimester: nl 2hr RPR: Non Reactive (08/16 0853)  Flu vaccine 04-08-17 HBsAg: Negative (04/23 1617)  TDaP vaccine  02/11/17                               Unwanted fertility 04/08/2017    Patient Active Problem List   Diagnosis Date Noted   IUD (intrauterine device) in place 05/01/2019    Past Surgical History:  Procedure Laterality Date   CESAREAN SECTION N/A 11/20/2018   Procedure: CESAREAN SECTION;  Surgeon: Tereso Newcomer, MD;  Location: [facility] LD ORS;  Service: Obstetrics;  Laterality: N/A;     OB History      Gravida  7   Para  6   Term  5   Preterm  1   AB  1   Living  6      SAB  1   IAB  0   Ectopic  0   Multiple  0   Live Births  6           Family History  Problem Relation Age of Onset   Diabetes Maternal Grandmother     Social History   Tobacco Use   Smoking status: Every Day    Packs/day: 0.25    Years: 5.00    Pack years: 1.25    Types: Cigarettes   Smokeless tobacco: Never  Vaping Use   Vaping Use: Former  Substance Use Topics   Alcohol use: Yes    Comment: occasionally   Drug use: Yes    Types: Marijuana    Comment: not using at this moment    Home Medications Prior to Admission medications   Medication Sig Start Date End Date Taking? Authorizing Provider  acetaminophen (TYLENOL) 500 MG tablet Take 1,000 mg by mouth every 8 (eight) hours as needed for mild pain or headache.   Yes [provider]  fluconazole (DIFLUCAN) 150 MG tablet Take 1 tablet (150 mg total) by mouth once  a week. Patient not taking: Reported on 01/24/2021 06/23/20   Wallis Bamberg, PA-C  HYDROcodone-acetaminophen (NORCO/VICODIN) 5-325 MG tablet Take 1 tablet by mouth every 4 (four) hours as needed for moderate pain. Patient not taking: Reported on 01/24/2021 11/27/19   Mancel Bale, MD  ibuprofen (ADVIL) 800 MG tablet Take 1 tablet (800 mg total) by mouth every 8 (eight) hours as needed. Patient not taking: Reported on 01/24/2021 11/22/18   Arvilla Market, MD    Allergies    Patient has no known allergies.  Review of Systems   Review of Systems  Constitutional:  Negative for chills and fever.  HENT: Negative.    Respiratory: Negative.    Cardiovascular: Negative.   Gastrointestinal:  Positive for abdominal pain and nausea.  Musculoskeletal: Negative.   Skin: Negative.   Neurological: Negative.    Physical Exam Updated Vital Signs BP 113/72   Pulse 94   Temp 99.3 F (37.4 C)   Resp 16   LMP 01/20/2021 (Approximate)   SpO2 100%   Physical  Exam Vitals and nursing note reviewed.  Constitutional:      Appearance: She is well-developed.  HENT:     Head: Normocephalic.  Cardiovascular:     Rate and Rhythm: Normal rate and regular rhythm.     Heart sounds: No murmur heard. Pulmonary:     Effort: Pulmonary effort is normal.     Breath sounds: Normal breath sounds. No wheezing, rhonchi or rales.  Abdominal:     General: Bowel sounds are normal. There is no distension.     Palpations: Abdomen is soft.     Tenderness: There is abdominal tenderness. There is no guarding or rebound.  Musculoskeletal:        General: Normal range of motion.     Cervical back: Normal range of motion and neck supple.  Skin:    General: Skin is warm and dry.  Neurological:     General: No focal deficit present.     Mental Status: She is alert and oriented to person, place, and time.    ED Results / Procedures / Treatments   Labs (all labs ordered are listed, but only abnormal results are displayed) Labs Reviewed  COMPREHENSIVE METABOLIC PANEL - Abnormal; Notable for the following components:      Result Value   AST 14 (*)    Total Bilirubin 2.1 (*)    All other components within normal limits  CBC WITH DIFFERENTIAL/PLATELET - Abnormal; Notable for the following components:   WBC 13.7 (*)    RBC 3.79 (*)    Neutro Abs 11.3 (*)    All other components within normal limits  URINALYSIS, ROUTINE W REFLEX MICROSCOPIC - Abnormal; Notable for the following components:   APPearance HAZY (*)    Hgb urine dipstick SMALL (*)    Ketones, ur 20 (*)    Protein, ur 100 (*)    Leukocytes,Ua LARGE (*)    WBC, UA >50 (*)    Bacteria, UA MANY (*)    All other components within normal limits  LIPASE, BLOOD  PREGNANCY, URINE   Results for orders placed or performed during the hospital encounter of 01/23/21  Comprehensive metabolic panel  Result Value Ref Range   Sodium 137 135 - 145 mmol/L   Potassium 3.5 3.5 - 5.1 mmol/L   Chloride 105 98 - 111  mmol/L   CO2 25 22 - 32 mmol/L   Glucose, Bld 92 70 - 99 mg/dL   BUN  11 6 - 20 mg/dL   Creatinine, Ser 1.610.81 0.44 - 1.00 mg/dL   Calcium 9.3 8.9 - 09.610.3 mg/dL   Total Protein 7.1 6.5 - 8.1 g/dL   Albumin 4.2 3.5 - 5.0 g/dL   AST 14 (L) 15 - 41 U/L   ALT 12 0 - 44 U/L   Alkaline Phosphatase 66 38 - 126 U/L   Total Bilirubin 2.1 (H) 0.3 - 1.2 mg/dL   GFR, Estimated >04>60 >54>60 mL/min   Anion gap 7 5 - 15  Lipase, blood  Result Value Ref Range   Lipase 21 11 - 51 U/L  CBC with Differential  Result Value Ref Range   WBC 13.7 (H) 4.0 - 10.5 K/uL   RBC 3.79 (L) 3.87 - 5.11 MIL/uL   Hemoglobin 12.8 12.0 - 15.0 g/dL   HCT 09.837.1 11.936.0 - 14.746.0 %   MCV 97.9 80.0 - 100.0 fL   MCH 33.8 26.0 - 34.0 pg   MCHC 34.5 30.0 - 36.0 g/dL   RDW 82.912.0 56.211.5 - 13.015.5 %   Platelets 216 150 - 400 K/uL   nRBC 0.0 0.0 - 0.2 %   Neutrophils Relative % 83 %   Neutro Abs 11.3 (H) 1.7 - 7.7 K/uL   Lymphocytes Relative 11 %   Lymphs Abs 1.6 0.7 - 4.0 K/uL   Monocytes Relative 5 %   Monocytes Absolute 0.7 0.1 - 1.0 K/uL   Eosinophils Relative 1 %   Eosinophils Absolute 0.1 0.0 - 0.5 K/uL   Basophils Relative 0 %   Basophils Absolute 0.0 0.0 - 0.1 K/uL   Immature Granulocytes 0 %   Abs Immature Granulocytes 0.05 0.00 - 0.07 K/uL  Urinalysis, Routine w reflex microscopic Urine, Clean Catch  Result Value Ref Range   Color, Urine YELLOW YELLOW   APPearance HAZY (A) CLEAR   Specific Gravity, Urine 1.029 1.005 - 1.030   pH 6.0 5.0 - 8.0   Glucose, UA NEGATIVE NEGATIVE mg/dL   Hgb urine dipstick SMALL (A) NEGATIVE   Bilirubin Urine NEGATIVE NEGATIVE   Ketones, ur 20 (A) NEGATIVE mg/dL   Protein, ur 865100 (A) NEGATIVE mg/dL   Nitrite NEGATIVE NEGATIVE   Leukocytes,Ua LARGE (A) NEGATIVE   RBC / HPF 11-20 0 - 5 RBC/hpf   WBC, UA >50 (H) 0 - 5 WBC/hpf   Bacteria, UA MANY (A) NONE SEEN   Squamous Epithelial / LPF 0-5 0 - 5   Mucus PRESENT   Pregnancy, urine  Result Value Ref Range   Preg Test, Ur NEGATIVE NEGATIVE     EKG None  Radiology CT ABDOMEN PELVIS W CONTRAST  Result Date: 01/24/2021 CLINICAL DATA:  Abdominal pain of the EXAM: CT ABDOMEN AND PELVIS WITH CONTRAST TECHNIQUE: Multidetector CT imaging of the abdomen and pelvis was performed using the standard protocol following bolus administration of intravenous contrast. CONTRAST:  100mL OMNIPAQUE IOHEXOL 300 MG/ML  SOLN COMPARISON:  None. FINDINGS: Lower chest: Lung bases are clear. Normal heart size. No pericardial effusion. Hepatobiliary: No worrisome focal liver lesions. Smooth liver surface contour. Normal hepatic attenuation. Normal gallbladder and biliary tree. Pancreas: No pancreatic ductal dilatation or surrounding inflammatory changes. Spleen: Normal in size. No concerning splenic lesions. Adrenals/Urinary Tract: Normal adrenal glands. Kidneys are normally located with symmetric enhancement. No suspicious renal lesion, urolithiasis or hydronephrosis. Urinary bladder is unremarkable for the degree of distention. Stomach/Bowel: Distal esophagus is unremarkable. Distal stomach demonstrating some mild focal thickening possibly related to peristalsis. Numerous fluid-filled in thickened loops of  distal small bowel are seen. The colon also demonstrates some diffuse edematous mural thickening with a lack of formed stool. Bowel caliber remains within normal limits. No evidence of high-grade bowel obstruction. Appendix is not reliably visualized. Vascular/Lymphatic: Imaged in a late arterial phase. No aortic aneurysm or ectasia. Prominence of the bilateral parametrial/pelvic draining veins. No enlarged abdominal or pelvic lymph nodes. Reproductive: Anteverted uterus. Rotation of the patient's IUD with in the endometrial canal with the lateral limbs directed towards the left fundus and cervix and the proximal stem directed towards the right fundus. No clear intramural perforation is evident. No worrisome adnexal masses. Prominence of the parametrial vasculature  bilaterally. Other: No abdominopelvic free air or fluid. Challenging assessment given paucity of intraperitoneal fat. Musculoskeletal: No acute osseous abnormality or suspicious osseous lesion. IMPRESSION: Diffusely fluid-filled and mildly thickened appearance of much of the distal small bowel and colon without evidence of high-grade obstruction. Lack of formed stool suggesting some rapid transit state. Such features can be seen with a nonspecific enterocolitis. 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 Slight prominence of the parametrial vasculature, may be accentuated by by contrast timing though can also be seen in the clinical entity of pelvic congestion syndrome. Correlate with exam findings. Electronically Signed   By: Kreg Shropshire M.D.   On: 01/24/2021 02:26    Procedures Procedures   Medications Ordered in ED Medications  cefTRIAXone (ROCEPHIN) 1 g in sodium chloride 0.9 % 100 mL IVPB (has no administration in time range)  ibuprofen (ADVIL) tablet 400 mg (400 mg Oral Given 01/24/21 0038)  iohexol (OMNIPAQUE) 300 MG/ML solution 100 mL (100 mLs Intravenous Contrast Given 01/24/21 0209)    ED Course  I have reviewed the triage vital signs and the nursing notes.  Pertinent labs & imaging results that were available during my care of the patient were reviewed by me and considered in my medical decision making (see chart for details).    MDM Rules/Calculators/A&P                           Patient to ED with ss/sxs as per HPI x 1 day.   Patient appears uncomfortable. No vomiting. Abdomen diffusely tender without distention. Normal Bowel sounds. Had low grade temp 100.1 earlier.   Labs with mild leukocytosis of 13.7, nonspecific. There is evidence of UTI - Rocephin provided. Mild elevation of total bilirubin to 2.1, also nonspecific. CT done and is c/w enterocolitis.   On recheck,  the patient states she has started to have diarrhea, stating "I feel like I have to go again". Still in NAD, nontoxic.   Recheck vital signs shows mild tachycardia, BP 109/74. Will give bolus of fluids but anticipate discharge after bolus. Patient updated on assessment and plan.  Final Clinical Impression(s) / ED Diagnoses Final diagnoses:  None   Enterocolitis Diffuse abdominal pain  Rx / DC Orders ED Discharge Orders     None        Danne Harbor 01/24/21 0620    Melene Plan, DO 01/24/21 [phone]

## 2021-01-24 NOTE — Discharge Instructions (Addendum)
Take Keflex for bladder infection as prescribed. Use Zofran every 8 hours as needed for any further nausea/vomiting. If you develop more diarrhea, and are having more than 5 bowel movements a day, use Imodium. Push fluids to avoid dehydration.   Return to the emergency department with any new or concerning symptoms at any time.

## 2021-03-12 ENCOUNTER — Ambulatory Visit (INDEPENDENT_AMBULATORY_CARE_PROVIDER_SITE_OTHER): Payer: Medicaid Other | Admitting: Family Medicine

## 2021-03-12 ENCOUNTER — Other Ambulatory Visit: Payer: Self-pay

## 2021-03-12 ENCOUNTER — Encounter: Payer: Self-pay | Admitting: Family Medicine

## 2021-03-12 ENCOUNTER — Other Ambulatory Visit (HOSPITAL_COMMUNITY)
Admission: RE | Admit: 2021-03-12 | Discharge: 2021-03-12 | Disposition: A | Discharge status: Home / Self Care | Payer: Medicaid Other | Source: Ambulatory Visit | Attending: Family Medicine | Admitting: Family Medicine

## 2021-03-12 VITALS — BP 118/83 | HR 85 | Wt 117.4 lb

## 2021-03-12 DIAGNOSIS — Z113 Encounter for screening for infections with a predominantly sexual mode of transmission: Secondary | ICD-10-CM

## 2021-03-12 DIAGNOSIS — Z01419 Encounter for gynecological examination (general) (routine) without abnormal findings: Secondary | ICD-10-CM | POA: Diagnosis not present

## 2021-03-12 DIAGNOSIS — Z23 Encounter for immunization: Secondary | ICD-10-CM | POA: Diagnosis not present

## 2021-03-12 DIAGNOSIS — Z124 Encounter for screening for malignant neoplasm of cervix: Secondary | ICD-10-CM | POA: Insufficient documentation

## 2021-03-12 NOTE — Patient Instructions (Signed)
Preventive Care 21-31 Years Old, Female Preventive care refers to lifestyle choices and visits with your health care provider that can promote health and wellness. This includes: A yearly physical exam. This is also called an annual wellness visit. Regular dental and eye exams. Immunizations. Screening for certain conditions. Healthy lifestyle choices, such as: Eating a healthy diet. Getting regular exercise. Not using drugs or products that contain nicotine and tobacco. Limiting alcohol use. What can I expect for my preventive care visit? Physical exam Your health care provider may check your: Height and weight. These may be used to calculate your BMI (body mass index). BMI is a measurement that tells if you are at a healthy weight. Heart rate and blood pressure. Body temperature. Skin for abnormal spots. Counseling Your health care provider may ask you questions about your: Past medical problems. Family's medical history. Alcohol, tobacco, and drug use. Emotional well-being. Home life and relationship well-being. Sexual activity. Diet, exercise, and sleep habits. Work and work environment. Access to firearms. Method of birth control. Menstrual cycle. Pregnancy history. What immunizations do I need? Vaccines are usually given at various ages, according to a schedule. Your health care provider will recommend vaccines for you based on your age, medical history, and lifestyle or other factors, such as travel or where you work. What tests do I need? Blood tests Lipid and cholesterol levels. These may be checked every 5 years starting at age 20. Hepatitis C test. Hepatitis B test. Screening Diabetes screening. This is done by checking your blood sugar (glucose) after you have not eaten for a while (fasting). STD (sexually transmitted disease) testing, if you are at risk. BRCA-related cancer screening. This may be done if you have a family history of breast, ovarian, tubal, or  peritoneal cancers. Pelvic exam and Pap test. This may be done every 3 years starting at age 21. Starting at age 30, this may be done every 5 years if you have a Pap test in combination with an HPV test. Talk with your health care provider about your test results, treatment options, and if necessary, the need for more tests. Follow these instructions at home: Eating and drinking  Eat a healthy diet that includes fresh fruits and vegetables, whole grains, lean protein, and low-fat dairy products. Take vitamin and mineral supplements as recommended by your health care provider. Do not drink alcohol if: Your health care provider tells you not to drink. You are pregnant, may be pregnant, or are planning to become pregnant. If you drink alcohol: Limit how much you have to 0-1 drink a day. Be aware of how much alcohol is in your drink. In the [place] one drink equals one 12 oz bottle of beer (355 mL), one 5 oz glass of wine (148 mL), or one 1 oz glass of hard liquor (44 mL). Lifestyle Take daily care of your teeth and gums. Brush your teeth every morning and night with fluoride toothpaste. Floss one time each day. Stay active. Exercise for at least 30 minutes 5 or more days each week. Do not use any products that contain nicotine or tobacco, such as cigarettes, e-cigarettes, and chewing tobacco. If you need help quitting, ask your health care provider. Do not use drugs. If you are sexually active, practice safe sex. Use a condom or other form of protection to prevent STIs (sexually transmitted infections). If you do not wish to become pregnant, use a form of birth control. If you plan to become pregnant, see your health care provider   for a prepregnancy visit. Find healthy ways to cope with stress, such as: Meditation, yoga, or listening to music. Journaling. Talking to a trusted person. Spending time with friends and family. Safety Always wear your seat belt while driving or riding in a  vehicle. Do not drive: If you have been drinking alcohol. Do not ride with someone who has been drinking. When you are tired or distracted. While texting. Wear a helmet and other protective equipment during sports activities. If you have firearms in your house, make sure you follow all gun safety procedures. Seek help if you have been physically or sexually abused. What's next? Go to your health care provider once a year for an annual wellness visit. Ask your health care provider how often you should have your eyes and teeth checked. Stay up to date on all vaccines. This information is not intended to replace advice given to you by your health care provider. Make sure you discuss any questions you have with your health care provider. Document Revised: 08/23/2020 Document Reviewed: 02/24/2018 Elsevier Patient Education  2022 Elsevier Inc.  

## 2021-03-12 NOTE — Progress Notes (Signed)
Subjective:     Marisa Stephens is a 30 y.o. female and is here for a comprehensive physical exam. The patient reports no problems. Has IUD in place since C-section with placement in 2020. Doing well. Has some irregular menses which are not heavy. Does not want more kids. Seen in the ED for abdominal pain and noted to have some mild enterocolitis, improved with Abx. No on-going bowel issues. Denies blood or mucous in stools. Also noted IUD was malpositioned, but does not cause her pain and is working well. Also noted parametrial congestion. Patient does not have pelvic pain.  The following portions of the patient's history were reviewed and updated as appropriate: allergies, current medications, past family history, past medical history, past social history, past surgical history, and problem list.  Review of Systems Pertinent items noted in HPI and remainder of comprehensive ROS otherwise negative.   Objective:    BP 118/83   Pulse 85   Wt 117 lb 6.4 oz (53.3 kg)   BMI 21.47 kg/m  General appearance: alert, cooperative, and appears stated age Head: Normocephalic, without obvious abnormality, atraumatic Neck: no adenopathy, supple, symmetrical, trachea midline, and thyroid not enlarged, symmetric, no tenderness/mass/nodules Lungs: clear to auscultation bilaterally Heart: regular rate and rhythm, S1, S2 normal, no murmur, click, rub or gallop Abdomen: soft, non-tender; bowel sounds normal; no masses,  no organomegaly Pelvic: cervix normal in appearance, external genitalia normal, no adnexal masses or tenderness, no cervical motion tenderness, uterus normal size, shape, and consistency, vagina normal without discharge, and IUD strings noted Extremities: extremities normal, atraumatic, no cyanosis or edema Pulses: 2+ and symmetric Skin: Skin color, texture, turgor normal. No rashes or lesions Lymph nodes: Cervical, supraclavicular, and axillary nodes normal. Neurologic: Grossly normal     Assessment:    Healthy female exam.      Plan:  Encounter for gynecological examination without abnormal finding  Screening for malignant neoplasm of cervix - Plan: Cytology - PAP( [place])  Screen for STD (sexually transmitted disease) - Plan: Cervicovaginal ancillary only( [place]), Hepatitis B surface antigen, Hepatitis C antibody, HIV Antibody (routine testing w rflx), RPR  Return in 1 year (on 03/12/2022).    See After Visit Summary for Counseling Recommendations

## 2021-03-13 LAB — CERVICOVAGINAL ANCILLARY ONLY
Bacterial Vaginitis (gardnerella): POSITIVE — AB
Candida Glabrata: NEGATIVE
Candida Vaginitis: NEGATIVE
Chlamydia: NEGATIVE
Comment: NEGATIVE
Comment: NEGATIVE
Comment: NEGATIVE
Comment: NEGATIVE
Comment: NEGATIVE
Comment: NORMAL
Neisseria Gonorrhea: POSITIVE — AB
Trichomonas: NEGATIVE

## 2021-03-13 LAB — HIV ANTIBODY (ROUTINE TESTING W REFLEX): HIV Screen 4th Generation wRfx: NONREACTIVE

## 2021-03-13 LAB — RPR: RPR Ser Ql: NONREACTIVE

## 2021-03-13 LAB — HEPATITIS C ANTIBODY: Hep C Virus Ab: <0.1 s/co ratio (ref 0.0–0.9)

## 2021-03-13 LAB — HEPATITIS B SURFACE ANTIGEN: Hepatitis B Surface Ag: NEGATIVE

## 2021-03-14 ENCOUNTER — Telehealth: Payer: Self-pay

## 2021-03-14 NOTE — Telephone Encounter (Addendum)
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  Pt notified of results and the need to have her and partner(s) treated as well.  Pt scheduled for 03/17/21 @ 1430 STD treatment.  Pt strongly advised to abstain from intercourse over the weekend.  I also advised pt that once she is treated on 03/17/21 to please do not have sex until her and her partner have treated and abstain for two weeks.  Pt verbalized understanding.  STD card completed.   Leonette Nutting  03/14/21

## 2021-03-17 ENCOUNTER — Other Ambulatory Visit: Payer: Self-pay

## 2021-03-17 ENCOUNTER — Ambulatory Visit (INDEPENDENT_AMBULATORY_CARE_PROVIDER_SITE_OTHER): Payer: Medicaid Other

## 2021-03-17 VITALS — BP 130/79 | HR 96 | Ht 61.0 in | Wt 117.7 lb

## 2021-03-17 DIAGNOSIS — A549 Gonococcal infection, unspecified: Secondary | ICD-10-CM

## 2021-03-17 MED ORDER — METRONIDAZOLE 500 MG PO TABS
500.0000 mg | ORAL_TABLET | Freq: Two times a day (BID) | ORAL | 0 refills | Status: DC
Start: 2021-03-17 — End: 2021-04-16

## 2021-03-17 MED ORDER — CEFTRIAXONE SODIUM 500 MG IJ SOLR
500.0000 mg | Freq: Once | INTRAMUSCULAR | Status: AC
  Administered 2021-03-17: 500 mg via INTRAMUSCULAR

## 2021-03-17 NOTE — Progress Notes (Signed)
[name] here for Rocephin  Injection.  Injection administered without complication.   Also addressed PHQ-9 and GAD-7. Pt states just situational with new STD and refused counseling at this time. Pt advised if needs BH, then we can see her. Pt verbalized understanding. Denies SI or plan at this time.   Judeth Cornfield, RN

## 2021-03-17 NOTE — Progress Notes (Signed)
Patient seen and assessed by nursing staff.  Agree with documentation and plan.  

## 2021-03-25 ENCOUNTER — Telehealth: Payer: Self-pay

## 2021-03-25 LAB — CYTOLOGY - PAP
Comment: NEGATIVE
Comment: NEGATIVE
Diagnosis: UNDETERMINED — AB
HPV 16: NEGATIVE
HPV 18 / 45: NEGATIVE
High risk HPV: POSITIVE — AB

## 2021-03-25 NOTE — Telephone Encounter (Signed)
-----   Message from Reva Bores, MD sent at 03/25/2021  1:32 PM EDT ----- Needs colpo

## 2021-03-25 NOTE — Telephone Encounter (Signed)
Call placed to pt. Spoke with pt. Pt given results and recommendations per Dr Shawnie Pons. Pt verbalized understanding. Pt agreeable to plan of care. Pt scheduled for colpo on 10/19 at 2:55pm with Dr Shawnie Pons. Pt agreeable to date and time of appt. Brief explanation given on colpo.  Judeth Cornfield, RN

## 2021-04-16 ENCOUNTER — Other Ambulatory Visit (HOSPITAL_COMMUNITY)
Admission: RE | Admit: 2021-04-16 | Discharge: 2021-04-16 | Disposition: A | Discharge status: Home / Self Care | Payer: Medicaid Other | Source: Ambulatory Visit | Attending: Family Medicine | Admitting: Family Medicine

## 2021-04-16 ENCOUNTER — Encounter: Payer: Self-pay | Admitting: Family Medicine

## 2021-04-16 ENCOUNTER — Ambulatory Visit (INDEPENDENT_AMBULATORY_CARE_PROVIDER_SITE_OTHER): Payer: Medicaid Other | Admitting: Family Medicine

## 2021-04-16 ENCOUNTER — Other Ambulatory Visit: Payer: Self-pay

## 2021-04-16 VITALS — BP 120/88 | HR 86 | Wt 117.0 lb

## 2021-04-16 DIAGNOSIS — Z3202 Encounter for pregnancy test, result negative: Secondary | ICD-10-CM | POA: Diagnosis not present

## 2021-04-16 DIAGNOSIS — R87810 Cervical high risk human papillomavirus (HPV) DNA test positive: Secondary | ICD-10-CM

## 2021-04-16 DIAGNOSIS — R87610 Atypical squamous cells of undetermined significance on cytologic smear of cervix (ASC-US): Secondary | ICD-10-CM

## 2021-04-16 DIAGNOSIS — A549 Gonococcal infection, unspecified: Secondary | ICD-10-CM | POA: Diagnosis present

## 2021-04-16 DIAGNOSIS — N870 Mild cervical dysplasia: Secondary | ICD-10-CM

## 2021-04-16 DIAGNOSIS — Z113 Encounter for screening for infections with a predominantly sexual mode of transmission: Secondary | ICD-10-CM

## 2021-04-16 LAB — POCT PREGNANCY, URINE: Preg Test, Ur: NEGATIVE

## 2021-04-17 LAB — CERVICOVAGINAL ANCILLARY ONLY
Bacterial Vaginitis (gardnerella): POSITIVE — AB
Chlamydia: NEGATIVE
Comment: NEGATIVE
Comment: NEGATIVE
Comment: NEGATIVE
Comment: NORMAL
Neisseria Gonorrhea: POSITIVE — AB
Trichomonas: NEGATIVE

## 2021-04-17 LAB — HEPATITIS C ANTIBODY: Hep C Virus Ab: <0.1 s/co ratio (ref 0.0–0.9)

## 2021-04-17 LAB — RPR: RPR Ser Ql: NONREACTIVE

## 2021-04-17 LAB — HEPATITIS B SURFACE ANTIGEN: Hepatitis B Surface Ag: NEGATIVE

## 2021-04-17 LAB — HIV ANTIBODY (ROUTINE TESTING W REFLEX): HIV Screen 4th Generation wRfx: NONREACTIVE

## 2021-04-17 NOTE — Progress Notes (Signed)
    GYNECOLOGY OFFICE COLPOSCOPY PROCEDURE NOTE  30 y.o. 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 Discussed role for HPV in cervical dysplasia, need for surveillance.  Patient gave informed written consent, time out was performed.  Placed in lithotomy position. Cervix viewed with speculum and colposcope after application of acetic acid.   Colposcopy adequate? Yes  acetowhite lesion(s) noted at anterior and posterior SCJ. 2, 5, 10 o'clock biopsies obtained.  ECC specimen obtained. All specimens were labeled and sent to pathology.  Patient was given post procedure instructions.  Will follow up pathology and manage accordingly; patient will be contacted with results and recommendations.   A re-screen for previous + GC was sent today as well.  Reva Bores, MD 04/17/2021 10:34 AM

## 2021-04-18 ENCOUNTER — Telehealth: Payer: Self-pay | Admitting: General Practice

## 2021-04-18 LAB — SURGICAL PATHOLOGY

## 2021-04-18 NOTE — Telephone Encounter (Signed)
Called patient and informed her of results. Offered appt on Monday 10/24 @ 3pm for treatment. Patient verbalized understanding.

## 2021-04-18 NOTE — Telephone Encounter (Signed)
-----   Message from Reva Bores, MD sent at 04/18/2021  7:10 AM EDT ----- Needs GC treatment.

## 2021-04-21 ENCOUNTER — Other Ambulatory Visit: Payer: Self-pay

## 2021-04-21 ENCOUNTER — Ambulatory Visit (INDEPENDENT_AMBULATORY_CARE_PROVIDER_SITE_OTHER): Payer: Medicaid Other

## 2021-04-21 VITALS — BP 111/71 | HR 75

## 2021-04-21 DIAGNOSIS — A549 Gonococcal infection, unspecified: Secondary | ICD-10-CM

## 2021-04-21 MED ORDER — CEFTRIAXONE SODIUM 500 MG IJ SOLR
500.0000 mg | Freq: Once | INTRAMUSCULAR | Status: AC
  Administered 2021-04-21: 500 mg via INTRAMUSCULAR

## 2021-04-21 NOTE — Progress Notes (Signed)
[name] here for  Rocephin  injection for treatment of Gonorrhea.  Injection administered without complication. Patient will return in 4 weeks for TOC vaginal swab nurse visit. Pt instructed to avoid intercourse for 2 weeks from today. Explained all recent partners should be notified of Gonorrhea infection and need for treatment.  Marjo Bicker, RN 04/21/2021  3:35 PM

## 2021-04-23 NOTE — Progress Notes (Signed)
Chart reviewed for nurse visit. Agree with plan of care.   Warner Mccreedy, MD 04/23/2021 9:36 AM

## 2021-05-19 ENCOUNTER — Ambulatory Visit (INDEPENDENT_AMBULATORY_CARE_PROVIDER_SITE_OTHER): Payer: Medicaid Other

## 2021-05-19 ENCOUNTER — Other Ambulatory Visit (HOSPITAL_COMMUNITY)
Admission: RE | Admit: 2021-05-19 | Discharge: 2021-05-19 | Disposition: A | Discharge status: Home / Self Care | Payer: Medicaid Other | Source: Ambulatory Visit | Attending: Family Medicine | Admitting: Family Medicine

## 2021-05-19 ENCOUNTER — Other Ambulatory Visit: Payer: Self-pay

## 2021-05-19 VITALS — BP 120/86 | HR 86 | Wt 115.1 lb

## 2021-05-19 DIAGNOSIS — Z202 Contact with and (suspected) exposure to infections with a predominantly sexual mode of transmission: Secondary | ICD-10-CM | POA: Insufficient documentation

## 2021-05-19 NOTE — Progress Notes (Signed)
Pt here today for TOC after receiving treatment for gonorrhea.  Pt explained how to obtain self swab and that we will call with abnormal results. Pt verbalized understanding with no further questions.   Kavish Lafitte,RN  05/19/21

## 2021-05-20 LAB — CERVICOVAGINAL ANCILLARY ONLY
Bacterial Vaginitis (gardnerella): POSITIVE — AB
Candida Glabrata: NEGATIVE
Candida Vaginitis: NEGATIVE
Chlamydia: NEGATIVE
Comment: NEGATIVE
Comment: NEGATIVE
Comment: NEGATIVE
Comment: NEGATIVE
Comment: NEGATIVE
Comment: NORMAL
Neisseria Gonorrhea: NEGATIVE
Trichomonas: NEGATIVE

## 2021-05-24 ENCOUNTER — Telehealth: Payer: Medicaid Other | Admitting: Emergency Medicine

## 2021-05-24 DIAGNOSIS — N898 Other specified noninflammatory disorders of vagina: Secondary | ICD-10-CM | POA: Diagnosis not present

## 2021-05-24 MED ORDER — METRONIDAZOLE 500 MG PO TABS: 500.0000 mg | ORAL_TABLET | Freq: Two times a day (BID) | ORAL | 0 refills | Status: DC

## 2021-05-24 NOTE — Patient Instructions (Signed)
  [name], thank you for joining Rennis Harding, PA-C for today's virtual visit.  While this provider is not your primary care provider (PCP), if your PCP is located in our provider database this encounter information will be shared with them immediately following your visit.  Consent: (Patient) Marisa Stephens provided verbal consent for this virtual visit at the beginning of the encounter.  Current Medications:  Current Outpatient Medications:    metroNIDAZOLE (FLAGYL) 500 MG tablet, Take 1 tablet (500 mg total) by mouth 2 (two) times daily., Disp: 14 tablet, Rfl: 0   acetaminophen (TYLENOL) 500 MG tablet, Take 1,000 mg by mouth every 8 (eight) hours as needed for mild pain or headache. (Patient not taking: Reported on 04/16/2021), Disp: , Rfl:    Medications ordered in this encounter:  Meds ordered this encounter  Medications   metroNIDAZOLE (FLAGYL) 500 MG tablet    Sig: Take 1 tablet (500 mg total) by mouth 2 (two) times daily.    Dispense:  14 tablet    Refill:  0    Order Specific Question:   Supervising Provider    Answer:   Hyacinth Meeker, BRIAN [[id number]]     *If you need refills on other medications prior to your next appointment, please contact your pharmacy*  Follow-Up: Call back or seek an in-person evaluation if the symptoms worsen or if the condition fails to improve as anticipated.  Other Instructions Prescribed metronidazole 500 mg twice daily for 7 days (do not take while consuming alcohol) Take medication as prescribed and to completion Follow up with PCP or in person at urgent care if symptoms persists or you develop worsening symptoms such as fever, belly pain, vomiting, vaginal pain/ sores, etc...   If you have been instructed to have an in-person evaluation today at a local Urgent Care facility, please use the link below. It will take you to a list of all of our available [facility], including address, phone number and hours of operation. Please  do not delay care.  [facility]  If you or a family member do not have a primary care provider, use the link below to schedule a visit and establish care. When you choose a [facility] primary care physician or advanced practice provider, you gain a long-term partner in health. Find a Primary Care Provider  Learn more about [facility]'s in-office and virtual care options: [facility] - Get Care Now

## 2021-05-24 NOTE — Progress Notes (Signed)
Virtual Visit Consent   Marisa Stephens, you are scheduled for a virtual visit with a [facility] provider today.     Just as with appointments in the office, your consent must be obtained to participate.  Your consent will be active for this visit and any virtual visit you may have with one of our providers in the next 365 days.     If you have a MyChart account, a copy of this consent can be sent to you electronically.  All virtual visits are billed to your insurance company just like a traditional visit in the office.    As this is a virtual visit, video technology does not allow for your provider to perform a traditional examination.  This may limit your provider's ability to fully assess your condition.  If your provider identifies any concerns that need to be evaluated in person or the need to arrange testing (such as labs, EKG, etc.), we will make arrangements to do so.     Although advances in technology are sophisticated, we cannot ensure that it will always work on either your end or our end.  If the connection with a video visit is poor, the visit may have to be switched to a telephone visit.  With either a video or telephone visit, we are not always able to ensure that we have a secure connection.     I need to obtain your verbal consent now.   Are you willing to proceed with your visit today?    Marisa Stephens has provided verbal consent on 05/24/2021 for a virtual visit (video or telephone).   Marisa Stephens, [place]   Date: 05/24/2021 6:24 PM   Virtual Visit via Video Note   I, Marisa Stephens, connected with  Marisa Stephens  ([id number], 13-Apr-1991) on 05/24/21 at  6:45 PM EST by a video-enabled telemedicine application and verified that I am speaking with the correct person using two identifiers.  Location: Patient: Virtual Visit Location Patient: Home Provider: Virtual Visit Location Provider: Home Office   I discussed the limitations of evaluation and management by  telemedicine and the availability of in person appointments. The patient expressed understanding and agreed to proceed.    History of Present Illness: Marisa Stephens is a 30 y.o. who identifies as a female who was assigned female at birth, and is being seen today for creamy white vaginal discharge x 2 days.  She denies a precipitating event, recent sexual encounter or recent antibiotic use.  States she was tested for STI as well as yeast and BV.  Test was positive for BV.  She has NOT tried OTC medications.  Denies aggravating factors.  She reports similar symptoms in the past and was diagnosed with BV.  She denies fever, chills, nausea, vomiting, vaginal itching, vaginal odor, vaginal bleeding, dyspareunia, vaginal lesions.   HPI: HPI  Problems:  Patient Active Problem List   Diagnosis Date Noted   IUD (intrauterine device) in place 05/01/2019    Allergies: No Known Allergies Medications:  Current Outpatient Medications:    metroNIDAZOLE (FLAGYL) 500 MG tablet, Take 1 tablet (500 mg total) by mouth 2 (two) times daily., Disp: 14 tablet, Rfl: 0   acetaminophen (TYLENOL) 500 MG tablet, Take 1,000 mg by mouth every 8 (eight) hours as needed for mild pain or headache. (Patient not taking: Reported on 04/16/2021), Disp: , Rfl:   Observations/Objective: Patient is well-developed, well-nourished in no acute distress.  Resting comfortably at home.  Head  is normocephalic, atraumatic.  No labored breathing. Speech is clear and coherent with logical content.  Patient is alert and oriented at baseline.    Assessment and Plan: 1. Vaginal discharge  Prescribed metronidazole 500 mg twice daily for 7 days (do not take while consuming alcohol) Take medication as prescribed and to completion Follow up with PCP or in person at urgent care if symptoms persists or you develop worsening symptoms such as fever, belly pain, vomiting, vaginal pain/ sores, etc...  Follow Up Instructions: I discussed the  assessment and treatment plan with the patient. The patient was provided an opportunity to ask questions and all were answered. The patient agreed with the plan and demonstrated an understanding of the instructions.  A copy of instructions were sent to the patient via MyChart unless otherwise noted below.   The patient was advised to call back or seek an in-person evaluation if the symptoms worsen or if the condition fails to improve as anticipated.  Time:  I spent 5-10 minutes with the patient via telehealth technology discussing the above problems/concerns.    Marisa Harding, PA-C

## 2021-06-08 NOTE — Progress Notes (Signed)
Patient was assessed and managed by nursing staff during this encounter. I have reviewed the chart and agree with the documentation and plan. I have also made any necessary editorial changes.  Rosemary Mossbarger, MD 06/08/2021 9:11 PM   

## 2021-07-17 ENCOUNTER — Encounter: Payer: Self-pay | Admitting: Family Medicine

## 2021-07-17 MED ORDER — METRONIDAZOLE 500 MG PO TABS: 500.0000 mg | ORAL_TABLET | Freq: Two times a day (BID) | ORAL | 0 refills | Status: DC

## 2022-02-12 ENCOUNTER — Telehealth: Payer: Medicaid Other | Admitting: Physician Assistant

## 2022-02-12 ENCOUNTER — Encounter: Payer: Self-pay | Admitting: Family Medicine

## 2022-02-12 NOTE — Progress Notes (Signed)
No show

## 2022-10-13 ENCOUNTER — Ambulatory Visit (HOSPITAL_COMMUNITY)
Admission: EM | Admit: 2022-10-13 | Discharge: 2022-10-13 | Disposition: A | Discharge status: Home / Self Care | Payer: 59 | Attending: Physician Assistant | Admitting: Physician Assistant

## 2022-10-13 ENCOUNTER — Encounter (HOSPITAL_COMMUNITY): Payer: Self-pay

## 2022-10-13 DIAGNOSIS — K047 Periapical abscess without sinus: Secondary | ICD-10-CM | POA: Diagnosis not present

## 2022-10-13 DIAGNOSIS — K0889 Other specified disorders of teeth and supporting structures: Secondary | ICD-10-CM | POA: Diagnosis not present

## 2022-10-13 MED ORDER — AMOXICILLIN-POT CLAVULANATE 875-125 MG PO TABS: 1.0000 | ORAL_TABLET | Freq: Two times a day (BID) | ORAL | 0 refills | Status: DC

## 2022-10-13 MED ORDER — KETOROLAC TROMETHAMINE 30 MG/ML IJ SOLN
INTRAMUSCULAR | Status: AC
  Filled 2022-10-13: qty 1

## 2022-10-13 MED ORDER — KETOROLAC TROMETHAMINE 30 MG/ML IJ SOLN
30.0000 mg | Freq: Once | INTRAMUSCULAR | Status: AC
  Administered 2022-10-13: 30 mg via INTRAMUSCULAR

## 2022-10-13 MED ORDER — HYDROCODONE-ACETAMINOPHEN 5-325 MG PO TABS: 1.0000 | ORAL_TABLET | Freq: Every evening | ORAL | 0 refills | Status: AC | PRN

## 2022-10-13 NOTE — Discharge Instructions (Signed)
I am glad that you are feeling better after the pain medication.  As we discussed, you should not take any ibuprofen or other NSAIDs including aspirin, naproxen/Aleve for the rest of today.  You can use acetaminophen/Tylenol.  I have called in 2 doses of hydrocodone to help with your pain.  This will make you sleepy and is addictive so try to use this as infrequently as possible.  I recommend taking half a dose to start with.  Start Augmentin twice daily for 7 days.  Gargle with warm salt water.  Follow-up with a dentist.  If you have worsening symptoms including swelling of your throat, shortness of breath, trouble swallowing, trouble speaking, high fever, nausea, vomiting you need to be seen immediately.

## 2022-10-13 NOTE — ED Provider Notes (Signed)
[facility]    CSN: [id number] Arrival date & time: 10/13/22  0847      History   Chief Complaint Chief Complaint  Patient presents with   Dental Pain    HPI Marisa Stephens is a 32 y.o. female.   Patient presents today with a 2-day history of dental pain in her right upper premolar region.  She has a known broken tooth in this area and has become swelling and painful over the past few days.  She denies any recent dental procedure but does follow with a dentist regularly.  She has had teeth extracted in the past with the last time this was done with several years ago.  She reports that pain is rated 10 on a 0-10 pain scale, localized to region with radiation into lower jaw and up towards temple, described as throbbing, worse with mastication or movement of jaw, no relieving factors identified.  She denies any dysphagia, muffled voice, shortness of breath, fever, nausea, vomiting.  She has been taking Tylenol without improvement of symptoms.  Denies any recent antibiotics.  She is confident that she is not pregnant.    Past Medical History:  Diagnosis Date   Chronic kidney disease    treated   Former smoker    Group beta Strep positive 2016   Headache    migraines; Tylenol helps   Hx of chlamydia infection 2010   treated   Marijuana use 12/06/2014   positive   Post term pregnancy at [redacted] weeks gestation 05/10/2017   Supervision of high risk pregnancy, antepartum 10/16/2016    Clinic [facility] Prenatal Labs Dating  LMP 07/05/16 Blood type: O/Positive/-- (04/23 1617)  Genetic Screen AFP:     NIPS:Genome First screen neg Antibody:Negative (04/23 1617) Anatomic [place]  normal, posterior placenta Rubella: 4.15 (04/23 1617) GTT Third trimester: nl 2hr RPR: Non Reactive (08/16 0853)  Flu vaccine 04-08-17 HBsAg: Negative (04/23 1617)  TDaP vaccine  02/11/17                               Unwanted fertility 04/08/2017    Patient Active Problem List   Diagnosis Date Noted   IUD  (intrauterine device) in place 05/01/2019    Past Surgical History:  Procedure Laterality Date   CESAREAN SECTION N/A 11/20/2018   Procedure: CESAREAN SECTION;  Surgeon: Tereso Newcomer, MD;  Location: [facility];  Service: Obstetrics;  Laterality: N/A;    OB History     Gravida  7   Para  6   Term  5   Preterm  1   AB  1   Living  6      SAB  1   IAB  0   Ectopic  0   Multiple  0   Live Births  6            Home Medications    Prior to Admission medications   Medication Sig Start Date End Date Taking? Authorizing Provider  amoxicillin-clavulanate (AUGMENTIN) 875-125 MG tablet Take 1 tablet by mouth every 12 (twelve) hours. 10/13/22  Yes Jaksen Fiorella, Noberto Retort, PA-C  HYDROcodone-acetaminophen (NORCO/VICODIN) 5-325 MG tablet Take 1 tablet by mouth at bedtime as needed for up to 2 days. 10/13/22 10/15/22 Yes Irasema Chalk, Noberto Retort, PA-C  acetaminophen (TYLENOL) 500 MG tablet Take 1,000 mg by mouth every 8 (eight) hours as needed for mild pain or headache. Patient not taking: Reported  on 04/16/2021    [provider]    Family History Family History  Problem Relation Age of Onset   Cancer Maternal Grandmother        lung   Diabetes Maternal Grandmother     Social History Social History   Tobacco Use   Smoking status: Every Day    Packs/day: 0.50    Years: 5.00    Additional pack years: 0.00    Total pack years: 2.50    Types: Cigarettes   Smokeless tobacco: Never  Vaping Use   Vaping Use: Former  Substance Use Topics   Alcohol use: Yes    Comment: occasionally   Drug use: Yes    Types: Marijuana    Comment: not using at this moment     Allergies   Patient has no known allergies.   Review of Systems Review of Systems  Constitutional:  Positive for activity change. Negative for appetite change, fatigue and fever.  HENT:  Positive for dental problem and facial swelling. Negative for congestion, sore throat, trouble swallowing and voice change.    Respiratory:  Negative for cough and shortness of breath.   Cardiovascular:  Negative for chest pain.  Gastrointestinal:  Negative for abdominal pain, diarrhea, nausea and vomiting.  Neurological:  Positive for headaches. Negative for dizziness and light-headedness.     Physical Exam Triage Vital Signs ED Triage Vitals [10/13/22 1033]  Enc Vitals Group     BP (!) 145/75     Pulse Rate (!) 110     Resp 18     Temp 98.5 F (36.9 C)     Temp Source Oral     SpO2 98 %     Weight      Height      Head Circumference      Peak Flow      Pain Score      Pain Loc      Pain Edu?      Excl. in GC?    No data found.  Updated Vital Signs BP (!) 146/93   Pulse 61   Temp 98.5 F (36.9 C) (Oral)   Resp 18   SpO2 99%   Visual Acuity Right Eye Distance:   Left Eye Distance:   Bilateral Distance:    Right Eye Near:   Left Eye Near:    Bilateral Near:     Physical Exam Vitals reviewed.  Constitutional:      General: She is awake. She is not in acute distress.    Appearance: Normal appearance. She is well-developed. She is not ill-appearing.     Comments: Very pleasant female appears stated age in no acute distress sitting comfortably in exam room  HENT:     Head: Normocephalic and atraumatic.     Right Ear: Tympanic membrane, ear canal and external ear normal. Tympanic membrane is not erythematous or bulging.     Left Ear: Tympanic membrane, ear canal and external ear normal. Tympanic membrane is not erythematous or bulging.     Nose:     Right Sinus: No maxillary sinus tenderness or frontal sinus tenderness.     Left Sinus: No maxillary sinus tenderness or frontal sinus tenderness.     Mouth/Throat:     Dentition: Abnormal dentition. Dental tenderness, gingival swelling and dental abscesses present.     Pharynx: Uvula midline. No oropharyngeal exudate or posterior oropharyngeal erythema.      Comments: No evidence of Ludwig angina Cardiovascular:  Rate and Rhythm:  Normal rate and regular rhythm.     Heart sounds: Normal heart sounds, S1 normal and S2 normal. No murmur heard. Pulmonary:     Effort: Pulmonary effort is normal.     Breath sounds: Normal breath sounds. No wheezing, rhonchi or rales.     Comments: Clear to auscultation bilaterally Psychiatric:        Behavior: Behavior is cooperative.      UC Treatments / Results  Labs (all labs ordered are listed, but only abnormal results are displayed) Labs Reviewed - No data to display  EKG   Radiology No results found.  Procedures Procedures (including critical care time)  Medications Ordered in UC Medications  ketorolac (TORADOL) 30 MG/ML injection 30 mg (30 mg Intramuscular Given 10/13/22 1149)    Initial Impression / Assessment and Plan / UC Course  I have reviewed the triage vital signs and the nursing notes.  Pertinent labs & imaging results that were available during my care of the patient were reviewed by me and considered in my medical decision making (see chart for details).     Patient was initially tachycardic but this resolved during visit.  She is otherwise well-appearing, afebrile, nontoxic.  Vital signs and physical exam are reassuring with no indication for emergent evaluation or imaging.  Dental abscess was noted on exam.  She was given 30 mg of Toradol with significant improvement of symptoms.  Discussed that she is not to take NSAIDs for minimum of 12 hours after the time of her injection.  She was given a few doses of hydrocodone to help manage her pain.  We discussed that this is sedating and addictive and she should limit use is much as possible.  She is not to drive or drink alcohol with taking this medication.  Review of [place] controlled substance database shows no inappropriate refills.  Discussed that ultimately she will need to see a dentist to address underlying tooth and was given contact information for local provider.  We discussed that if her  symptoms worsen and she has swelling of her throat, shortness of breath, muffled voice, dysphagia she needs to be seen immediately.  Strict return precautions given.  Work excuse note provided.  Final Clinical Impressions(s) / UC Diagnoses   Final diagnoses:  Dental infection  Dentalgia     Discharge Instructions      I am glad that you are feeling better after the pain medication.  As we discussed, you should not take any ibuprofen or other NSAIDs including aspirin, naproxen/Aleve for the rest of today.  You can use acetaminophen/Tylenol.  I have called in 2 doses of hydrocodone to help with your pain.  This will make you sleepy and is addictive so try to use this as infrequently as possible.  I recommend taking half a dose to start with.  Start Augmentin twice daily for 7 days.  Gargle with warm salt water.  Follow-up with a dentist.  If you have worsening symptoms including swelling of your throat, shortness of breath, trouble swallowing, trouble speaking, high fever, nausea, vomiting you need to be seen immediately.     ED Prescriptions     Medication Sig Dispense Auth. Provider   amoxicillin-clavulanate (AUGMENTIN) 875-125 MG tablet Take 1 tablet by mouth every 12 (twelve) hours. 14 tablet Laurice Kimmons K, PA-C   HYDROcodone-acetaminophen (NORCO/VICODIN) 5-325 MG tablet Take 1 tablet by mouth at bedtime as needed for up to 2 days. 2 tablet Alexavier Tsutsui, Noberto Retort,  PA-C      I have reviewed the PDMP during this encounter.   Jeani Hawking, PA-C 10/13/22 1234

## 2022-10-13 NOTE — ED Triage Notes (Signed)
Onset 2-day history of dental pain. Pt has been taking Motrin.

## 2022-10-15 ENCOUNTER — Encounter (HOSPITAL_COMMUNITY): Payer: Self-pay | Admitting: Emergency Medicine

## 2022-10-15 ENCOUNTER — Other Ambulatory Visit: Payer: Self-pay

## 2022-10-15 ENCOUNTER — Telehealth: Payer: 59 | Admitting: Family Medicine

## 2022-10-15 ENCOUNTER — Emergency Department (HOSPITAL_COMMUNITY)
Admission: EM | Admit: 2022-10-15 | Discharge: 2022-10-16 | Discharge status: Against Medical Advice | Payer: 59 | Attending: Emergency Medicine | Admitting: Emergency Medicine

## 2022-10-15 DIAGNOSIS — Z5321 Procedure and treatment not carried out due to patient leaving prior to being seen by health care provider: Secondary | ICD-10-CM | POA: Diagnosis not present

## 2022-10-15 DIAGNOSIS — K0889 Other specified disorders of teeth and supporting structures: Secondary | ICD-10-CM

## 2022-10-15 DIAGNOSIS — K122 Cellulitis and abscess of mouth: Secondary | ICD-10-CM | POA: Insufficient documentation

## 2022-10-15 MED ORDER — HYDROCODONE-ACETAMINOPHEN 5-325 MG PO TABS
1.0000 | ORAL_TABLET | Freq: Once | ORAL | Status: AC
  Administered 2022-10-15: 1 via ORAL
  Filled 2022-10-15: qty 1

## 2022-10-15 NOTE — Progress Notes (Signed)
[place]   Needs in person assessment for on going dental pain that is no well controlled.

## 2022-10-15 NOTE — ED Provider Triage Note (Signed)
Emergency Medicine Provider Triage Evaluation Note  Marisa Stephens , a 32 y.o. female  was evaluated in triage.  Pt complains of cyst in the mouth.  States she noticed to bumps in the front top portion of her mouth 2 days ago.  Was seen by urgent care 2 days ago for this complaint was started on amoxicillin. Lesions are painful and difficult to eat.  Denies oozing or bleeding from the lesions.  Endorses subjective fever at home.  Review of Systems  Positive: See above Negative: See above  Physical Exam  BP (!) 151/90 (BP Location: Right Arm)   Pulse 87   Temp 97.6 F (36.4 C)   Resp 20   SpO2 99%  Gen:   Awake, no distress   Resp:  Normal effort MSK:   Moves extremities without difficulty  Other:  2 fluctuant masses palpated in the upper right aspect of the palate.   Medical Decision Making  Medically screening exam initiated at 7:58 PM.  Appropriate orders placed.  JOELINE FREER was informed that the remainder of the evaluation will be completed by another provider, this initial triage assessment does not replace that evaluation, and the importance of remaining in the ED until their evaluation is complete.  Work up started   Gareth Eagle, PA-C 10/15/22 2000

## 2022-10-15 NOTE — ED Triage Notes (Signed)
Pt reports 2 cysts in roof of mouth that she noticed 2 days ago. Seen at urgent care and given amoxicillin. Denies drainage from cysts.

## 2022-10-16 ENCOUNTER — Ambulatory Visit (HOSPITAL_COMMUNITY)
Admission: EM | Admit: 2022-10-16 | Discharge: 2022-10-16 | Disposition: A | Discharge status: Home / Self Care | Payer: 59 | Attending: Emergency Medicine | Admitting: Emergency Medicine

## 2022-10-16 ENCOUNTER — Encounter (HOSPITAL_COMMUNITY): Payer: Self-pay

## 2022-10-16 DIAGNOSIS — K1379 Other lesions of oral mucosa: Secondary | ICD-10-CM

## 2022-10-16 DIAGNOSIS — K047 Periapical abscess without sinus: Secondary | ICD-10-CM

## 2022-10-16 MED ORDER — LIDOCAINE VISCOUS HCL 2 % MT SOLN: 15.0000 mL | OROMUCOSAL | 0 refills | Status: DC | PRN

## 2022-10-16 MED ORDER — IBUPROFEN 800 MG PO TABS: 800.0000 mg | ORAL_TABLET | Freq: Three times a day (TID) | ORAL | 0 refills | Status: DC

## 2022-10-16 NOTE — ED Provider Notes (Signed)
[facility]    CSN: [id number] Arrival date & time: 10/16/22  0945      History   Chief Complaint Chief Complaint  Patient presents with   Dental Pain    HPI Marisa Stephens is a 32 y.o. female.  Here with continued mouth pain Seen 4/16 and started on Augmentin for dental abscess. She is on day 3. Was given 2 tabs Norco. Seen in the ED yesterday, was given an additional Norco in triage but then left without being seen. Here reporting 10/10 pain and difficulty eating. No fevers. Denies shortness of breath, throat swelling, dysphagia, trouble talking.  Past Medical History:  Diagnosis Date   Chronic kidney disease    treated   Former smoker    Group beta Strep positive 2016   Headache    migraines; Tylenol helps   Hx of chlamydia infection 2010   treated   Marijuana use 12/06/2014   positive   Post term pregnancy at [redacted] weeks gestation 05/10/2017   Supervision of high risk pregnancy, antepartum 10/16/2016    Clinic [facility] Prenatal Labs Dating  LMP 07/05/16 Blood type: O/Positive/-- (04/23 1617)  Genetic Screen AFP:     NIPS:Genome First screen neg Antibody:Negative (04/23 1617) Anatomic [place]  normal, posterior placenta Rubella: 4.15 (04/23 1617) GTT Third trimester: nl 2hr RPR: Non Reactive (08/16 0853)  Flu vaccine 04-08-17 HBsAg: Negative (04/23 1617)  TDaP vaccine  02/11/17                               Unwanted fertility 04/08/2017    Patient Active Problem List   Diagnosis Date Noted   IUD (intrauterine device) in place 05/01/2019    Past Surgical History:  Procedure Laterality Date   CESAREAN SECTION N/A 11/20/2018   Procedure: CESAREAN SECTION;  Surgeon: Tereso Newcomer, MD;  Location: [facility];  Service: Obstetrics;  Laterality: N/A;    OB History     Gravida  7   Para  6   Term  5   Preterm  1   AB  1   Living  6      SAB  1   IAB  0   Ectopic  0   Multiple  0   Live Births  6            Home Medications    Prior to  Admission medications   Medication Sig Start Date End Date Taking? Authorizing Provider  ibuprofen (ADVIL) 800 MG tablet Take 1 tablet (800 mg total) by mouth 3 (three) times daily. 10/16/22  Yes Breelle Hollywood, Lurena Joiner, PA-C  acetaminophen (TYLENOL) 500 MG tablet Take 1,000 mg by mouth every 8 (eight) hours as needed for mild pain or headache. Patient not taking: Reported on 04/16/2021    [provider]  amoxicillin-clavulanate (AUGMENTIN) 875-125 MG tablet Take 1 tablet by mouth every 12 (twelve) hours. 10/13/22   Raspet, Denny Peon K, PA-C  lidocaine (XYLOCAINE) 2 % solution Use as directed 15 mLs in the mouth or throat every 3 (three) hours as needed for mouth pain. Swish/gargle and spit out 10/16/22   Milanya Sunderland, Lurena Joiner, PA-C    Family History Family History  Problem Relation Age of Onset   Cancer Maternal Grandmother        lung   Diabetes Maternal Grandmother     Social History Social History   Tobacco Use   Smoking status: Every Day  Packs/day: 0.50    Years: 5.00    Additional pack years: 0.00    Total pack years: 2.50    Types: Cigarettes   Smokeless tobacco: Never  Vaping Use   Vaping Use: Former  Substance Use Topics   Alcohol use: Yes    Comment: occasionally   Drug use: Yes    Types: Marijuana    Comment: not using at this moment     Allergies   Patient has no known allergies.   Review of Systems Review of Systems As per HPI  Physical Exam Triage Vital Signs ED Triage Vitals [10/16/22 1000]  Enc Vitals Group     BP 125/84     Pulse Rate 80     Resp 14     Temp 98.1 F (36.7 C)     Temp Source Oral     SpO2 99 %     Weight      Height      Head Circumference      Peak Flow      Pain Score 10     Pain Loc      Pain Edu?      Excl. in GC?    No data found.  Updated Vital Signs BP 125/84 (BP Location: Left Arm)   Pulse 80   Temp 98.1 F (36.7 C) (Oral)   Resp 14   SpO2 99%    Physical Exam Vitals and nursing note reviewed.   Constitutional:      General: She is not in acute distress.    Appearance: She is not ill-appearing.  HENT:     Mouth/Throat:     Mouth: Mucous membranes are moist. No angioedema.     Dentition: Abnormal dentition. Dental abscesses present.     Palate: Mass present.     Pharynx: Oropharynx is clear. No pharyngeal swelling or posterior oropharyngeal erythema.      Comments: Fluctuant area on the right upper mouth. Tender.  Neck:     Trachea: Trachea and phonation normal.     Comments: Full ROM of neck. No swelling noted. No LAD. Cardiovascular:     Rate and Rhythm: Normal rate and regular rhythm.     Heart sounds: Normal heart sounds.  Pulmonary:     Effort: Pulmonary effort is normal.     Breath sounds: Normal breath sounds.  Musculoskeletal:        General: Normal range of motion.     Cervical back: Full passive range of motion without pain and normal range of motion. No edema.  Lymphadenopathy:     Cervical: No cervical adenopathy.  Skin:    General: Skin is warm and dry.  Neurological:     Mental Status: She is alert and oriented to person, place, and time.      UC Treatments / Results  Labs (all labs ordered are listed, but only abnormal results are displayed) Labs Reviewed - No data to display  EKG   Radiology No results found.  Procedures Procedures (including critical care time)  Medications Ordered in UC Medications - No data to display  Initial Impression / Assessment and Plan / UC Course  I have reviewed the triage vital signs and the nursing notes.  Pertinent labs & imaging results that were available during my care of the patient were reviewed by me and considered in my medical decision making (see chart for details).  Will continue augmentin BID x 7 days for infection Ibuprofen 800 mg q6  hours Tylenol 650 mg q4 hours Lidocaine swish and spit for mouth pain Provided with several dental clinics including afterhours care. She will call today to  set up appointment Strict ED precautions for any change or worsening of symptoms. Patient agreeable to plan  Final Clinical Impressions(s) / UC Diagnoses   Final diagnoses:  Dental abscess  Mouth pain     Discharge Instructions      Continue 800 mg ibuprofen alternated with 650 mg tylenol every 4-6 hours.  Use the lidocaine swish and spit as often as needed to numb the mouth.  Continue and finish the full course of the antibiotic.  It should start to kick in tonight or tomorrow.  Please follow-up as soon as possible with a dentist. There are additional clinics to contact on the last page.  If at any point symptoms worsen I would recommend you be seen in the emergency department.     ED Prescriptions     Medication Sig Dispense Auth. Provider   ibuprofen (ADVIL) 800 MG tablet Take 1 tablet (800 mg total) by mouth 3 (three) times daily. 21 tablet Raequan Vanschaick, PA-C   lidocaine (XYLOCAINE) 2 % solution  (Status: Discontinued) Use as directed 15 mLs in the mouth or throat every 3 (three) hours as needed for mouth pain. Swish/gargle and spit out 100 mL Quanita Barona, PA-C   lidocaine (XYLOCAINE) 2 % solution Use as directed 15 mLs in the mouth or throat every 3 (three) hours as needed for mouth pain. Swish/gargle and spit out 100 mL Deanna Boehlke, Lurena Joiner, PA-C      PDMP not reviewed this encounter.   Kollyn Lingafelter, Ray Church 10/16/22 1207

## 2022-10-16 NOTE — ED Triage Notes (Signed)
Pt presents with c/o abscess on the roof of her mouth

## 2022-10-16 NOTE — Discharge Instructions (Addendum)
Continue 800 mg ibuprofen alternated with 650 mg tylenol every 4-6 hours.  Use the lidocaine swish and spit as often as needed to numb the mouth.  Continue and finish the full course of the antibiotic.  It should start to kick in tonight or tomorrow.  Please follow-up as soon as possible with a dentist. There are additional clinics to contact on the last page.  If at any point symptoms worsen I would recommend you be seen in the emergency department.

## 2022-10-16 NOTE — ED Notes (Signed)
Pt seen walking out the lobby doors.

## 2022-10-17 ENCOUNTER — Other Ambulatory Visit: Payer: Self-pay

## 2022-10-17 ENCOUNTER — Emergency Department (HOSPITAL_COMMUNITY)
Admission: EM | Admit: 2022-10-17 | Discharge: 2022-10-17 | Disposition: A | Discharge status: Home / Self Care | Payer: Medicaid Other | Attending: Emergency Medicine | Admitting: Emergency Medicine

## 2022-10-17 DIAGNOSIS — K0889 Other specified disorders of teeth and supporting structures: Secondary | ICD-10-CM | POA: Diagnosis present

## 2022-10-17 DIAGNOSIS — K047 Periapical abscess without sinus: Secondary | ICD-10-CM | POA: Diagnosis not present

## 2022-10-17 MED ORDER — IBUPROFEN 600 MG PO TABS: 600.0000 mg | ORAL_TABLET | Freq: Four times a day (QID) | ORAL | 0 refills | Status: DC | PRN

## 2022-10-17 MED ORDER — ONDANSETRON 4 MG PO TBDP: 4.0000 mg | ORAL_TABLET | Freq: Three times a day (TID) | ORAL | 0 refills | Status: DC | PRN

## 2022-10-17 MED ORDER — CLINDAMYCIN HCL 150 MG PO CAPS: 450.0000 mg | ORAL_CAPSULE | Freq: Three times a day (TID) | ORAL | 0 refills | Status: AC

## 2022-10-17 MED ORDER — OXYCODONE-ACETAMINOPHEN 5-325 MG PO TABS
1.0000 | ORAL_TABLET | Freq: Once | ORAL | Status: AC
  Administered 2022-10-17: 1 via ORAL
  Filled 2022-10-17: qty 1

## 2022-10-17 NOTE — ED Provider Notes (Signed)
[facility] Provider Note   CSN: [id number] Arrival date & time: 10/17/22  4098     History  Chief Complaint  Patient presents with   Dental Pain    Marisa Stephens is a 32 y.o. female.   Dental Pain   32 year old female presents emergency department complaints of dental pain.  Patient states that she has had pain for the past 4 to 5 days.  Has been seen twice for similar symptoms.  States that she has been trying to call dentistry outpatient but had difficulties with her insurance covering dental procedure.  States that areas of swelling have been improving but returns due to worsening pain and being out of nausea medicine.  Denies fever, chills, difficulty breathing/swallowing, tongue swelling.  Has known fractured teeth.  Past medical history significant for group B strep positive, marijuana use, headache  Home Medications Prior to Admission medications   Medication Sig Start Date End Date Taking? Authorizing Provider  clindamycin (CLEOCIN) 150 MG capsule Take 3 capsules (450 mg total) by mouth 3 (three) times daily for 11 days. 10/17/22 10/28/22 Yes Sherian Maroon A, PA  ibuprofen (ADVIL) 600 MG tablet Take 1 tablet (600 mg total) by mouth every 6 (six) hours as needed. 10/17/22  Yes Sherian Maroon A, PA  ondansetron (ZOFRAN-ODT) 4 MG disintegrating tablet Take 1 tablet (4 mg total) by mouth every 8 (eight) hours as needed for nausea or vomiting. 10/17/22  Yes Sherian Maroon A, PA  acetaminophen (TYLENOL) 500 MG tablet Take 1,000 mg by mouth every 8 (eight) hours as needed for mild pain or headache. Patient not taking: Reported on 04/16/2021    [provider]  amoxicillin-clavulanate (AUGMENTIN) 875-125 MG tablet Take 1 tablet by mouth every 12 (twelve) hours. 10/13/22   Raspet, Denny Peon K, PA-C  lidocaine (XYLOCAINE) 2 % solution Use as directed 15 mLs in the mouth or throat every 3 (three) hours as needed for mouth pain.  Swish/gargle and spit out 10/16/22   Rising, Lurena Joiner, PA-C      Allergies    Patient has no known allergies.    Review of Systems   Review of Systems  All other systems reviewed and are negative.   Physical Exam Updated Vital Signs BP (!) 143/88 (BP Location: Right Arm)   Pulse 93   Temp 98 F (36.7 C) (Oral)   Resp 18   SpO2 100%  Physical Exam Vitals and nursing note reviewed.  Constitutional:      General: She is not in acute distress.    Appearance: She is well-developed.  HENT:     Head: Normocephalic and atraumatic.     Mouth/Throat:      Comments: Patient with 2 areas of palpable fluctuance appreciated on right superior palate as indicated above.  Adjacent teeth fractured.  No sublingual swelling.  No Samtani buccal swelling.  Uvula midline rise symmetric with phonation.  Tonsils are 1+ bilaterally obvious exudate.  No trismus appreciated.  Patient tolerating oral secretions without difficulty. Eyes:     Conjunctiva/sclera: Conjunctivae normal.  Cardiovascular:     Rate and Rhythm: Normal rate and regular rhythm.     Heart sounds: No murmur heard. Pulmonary:     Effort: Pulmonary effort is normal. No respiratory distress.     Breath sounds: Normal breath sounds.  Abdominal:     Palpations: Abdomen is soft.     Tenderness: There is no abdominal tenderness.  Musculoskeletal:  General: No swelling.     Cervical back: Neck supple.  Skin:    General: Skin is warm and dry.     Capillary Refill: Capillary refill takes less than 2 seconds.  Neurological:     Mental Status: She is alert.  Psychiatric:        Mood and Affect: Mood normal.     ED Results / Procedures / Treatments   Labs (all labs ordered are listed, but only abnormal results are displayed) Labs Reviewed - No data to display  EKG None  Radiology No results found.  Procedures Procedures    Medications Ordered in ED Medications  oxyCODONE-acetaminophen (PERCOCET/ROXICET) 5-325 MG  per tablet 1 tablet (1 tablet Oral Given 10/17/22 1012)    ED Course/ Medical Decision Making/ A&P                             Medical Decision Making Risk Prescription drug management.   This patient presents to the ED for concern of dental pain, this involves an extensive number of treatment options, and is a complaint that carries with it a high risk of complications and morbidity.  The differential diagnosis includes fractured teeth, dental carry, necrotizing ulcerative gingivitis, Ludwig angina, Lemierre's disease, abscess   Co morbidities that complicate the patient evaluation  See HPI   Additional history obtained:  Additional history obtained from EMR External records from outside source obtained and reviewed including hospital records   Lab Tests:  N/a   Imaging Studies ordered:  N/a   Cardiac Monitoring: / EKG:  The patient was maintained on a cardiac monitor.  I personally viewed and interpreted the cardiac monitored which showed an underlying rhythm of: Sinus rhythm   Consultations Obtained:  N/a   Problem List / ED Course / Critical interventions / Medication management  Dental pain I ordered medication including Percocet   Reevaluation of the patient after these medicines showed that the patient improved I have reviewed the patients home medicines and have made adjustments as needed   Social Determinants of Health:  Chronic cigarette use.  Denies illicit drug use.   Test / Admission - Considered:  Dental pain Vitals signs significant for mild hypertension with blood pressure 143/88. Otherwise within normal range and stable throughout visit. 32 year old female presents emergency department with complaints of dental pain.  Patient with evidence of most likely dental abscess.  Patient with improvement of swelling since being seen last to urgent care yesterday.  No evidence of Ludwig angina clinically.  Patient tolerating p.o. without  difficulty.  Will broaden spectrum of antibiotics as well as refill patient's at home ibuprofen.  Dentist referral made while emergency department patient given list of outpatient dental resources in hand upon discharge.  Treatment plan discussed at length with patient and she acknowledged understanding was agreeable to said plan. Worrisome signs and symptoms were discussed with the patient, and the patient acknowledged understanding to return to the ED if noticed. Patient was stable upon discharge.          Final Clinical Impression(s) / ED Diagnoses Final diagnoses:  Dental infection    Rx / DC Orders ED Discharge Orders          Ordered    clindamycin (CLEOCIN) 150 MG capsule  3 times daily        10/17/22 1036    ondansetron (ZOFRAN-ODT) 4 MG disintegrating tablet  Every 8 hours PRN  10/17/22 1036    ibuprofen (ADVIL) 600 MG tablet  Every 6 hours PRN        10/17/22 1036    Ambulatory referral to Dentistry        10/17/22 1036              Peter Garter, [place] 10/17/22 1056    Tanda Rockers A, DO 10/17/22 1539

## 2022-10-17 NOTE — ED Triage Notes (Signed)
C/o abscess to roof of mouth x1 week.  Started on Augmentin on 4/16 and given 2 tabs of norco w/o relief.  Pt left w/o being seen after given a norco in triage.

## 2022-10-17 NOTE — Discharge Instructions (Addendum)
Note the workup today was overall consistent with dental abscess.  As discussed, we will broad-spectrum antibiotic therapy to include more mouth bacteria.  Will switch antibiotic to clindamycin.  Will send in Zofran to take as needed.  I have also sent in a dental referral.  See resource guide attached to call to set up appoint with dentist.  Please do not hesitate to return to emergency department for worrisome signs and symptoms we discussed to become apparent.

## 2022-10-23 IMAGING — CT CT ABD-PELV W/ CM
2 of 4 series · 15 of 46 positions shown, 17 images · IV contrast (APPLIED)
Comparison: None.

CLINICAL DATA: Abdominal pain of the

EXAM:
CT ABDOMEN AND PELVIS WITH CONTRAST
TECHNIQUE: Multidetector CT imaging of the abdomen and pelvis was performed
using the standard protocol following bolus administration of
intravenous contrast.
CONTRAST:  100mL OMNIPAQUE IOHEXOL 300 MG/ML  SOLN

[Series 3: abdomen 5.0 · axial · 0.57mm/px · z∈[+897,+1257]mm · 12 of 81 slices shown, 14 images]
[im 5/81  soft-tissue]
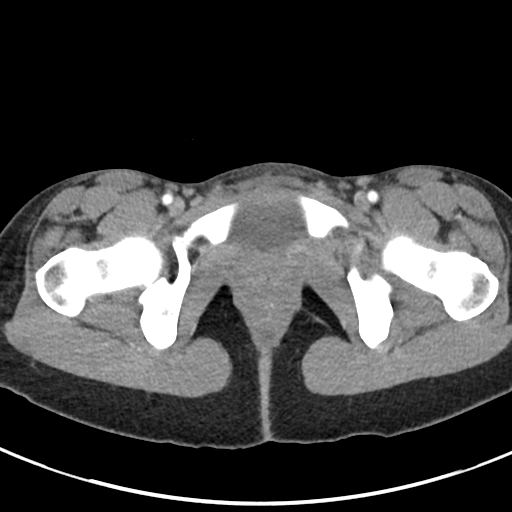
[im 5/81  bone]
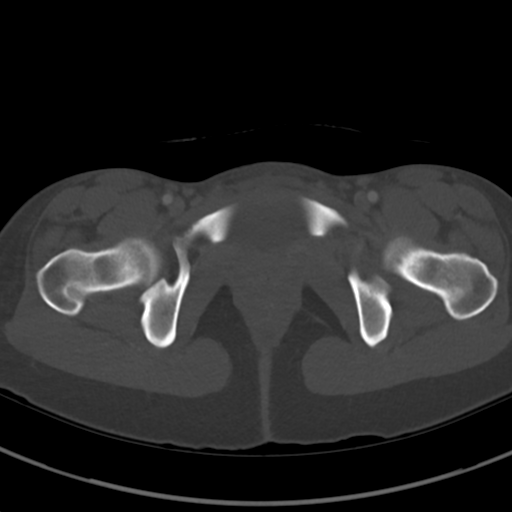
[im 13/81  soft-tissue]
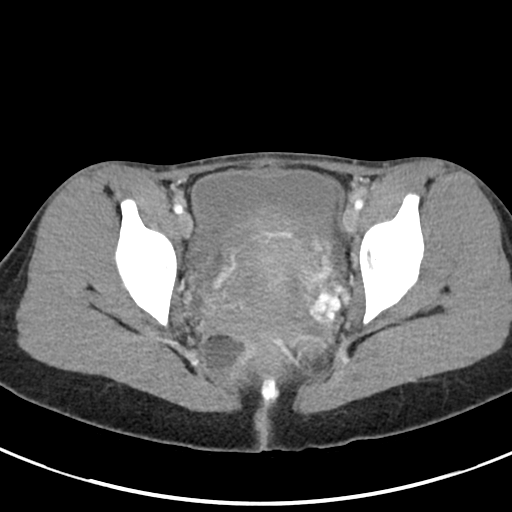
[im 17/81  soft-tissue]
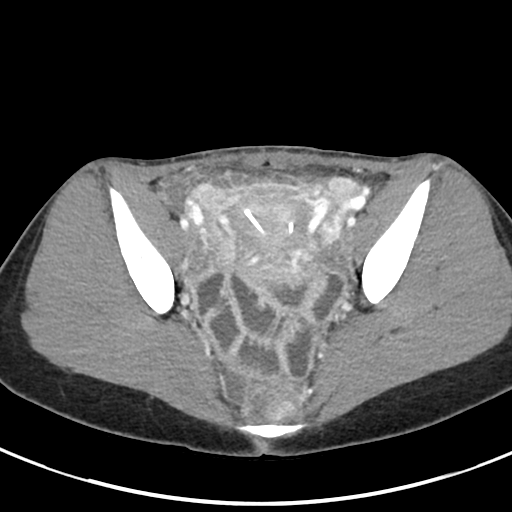
[im 25/81  soft-tissue]
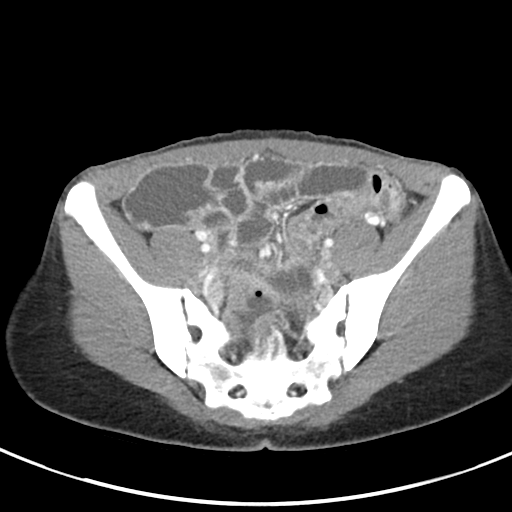
[im 33/81  soft-tissue]
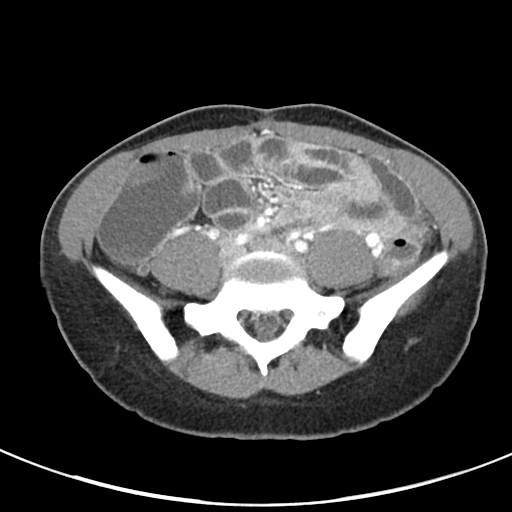
[im 37/81  soft-tissue]
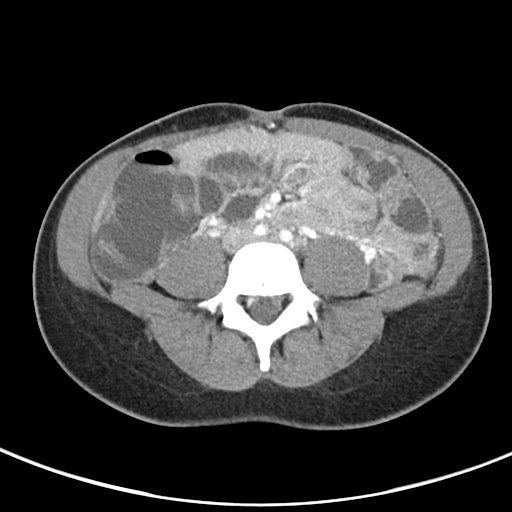
[im 45/81  soft-tissue]
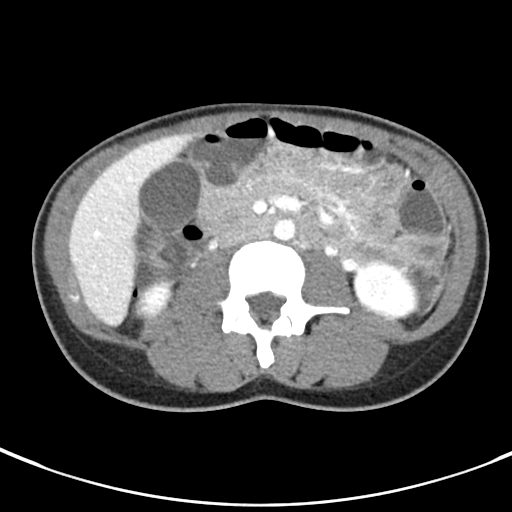
[im 49/81  soft-tissue]
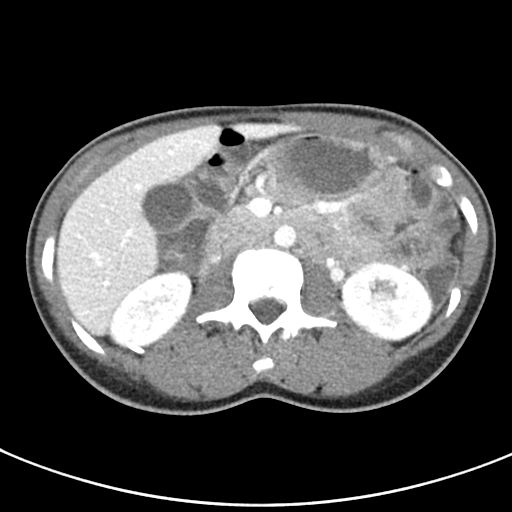
[im 57/81  soft-tissue]
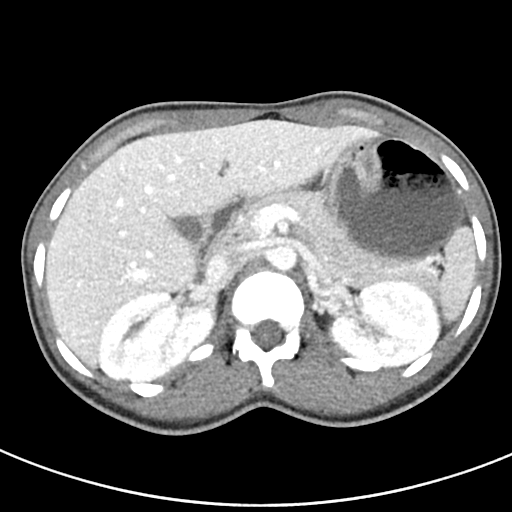
[im 57/81  bone]
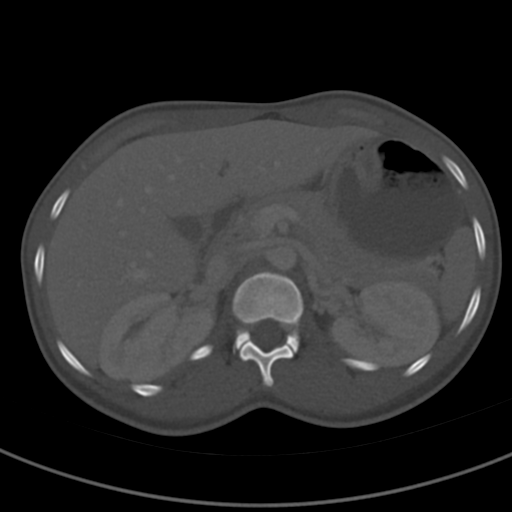
[im 65/81  soft-tissue]
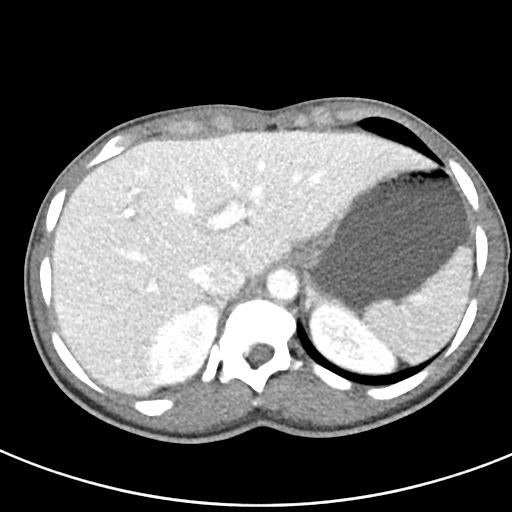
[im 69/81  soft-tissue]
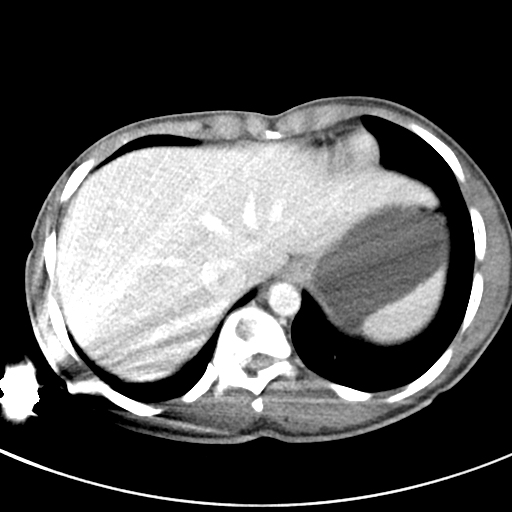
[im 77/81  soft-tissue]
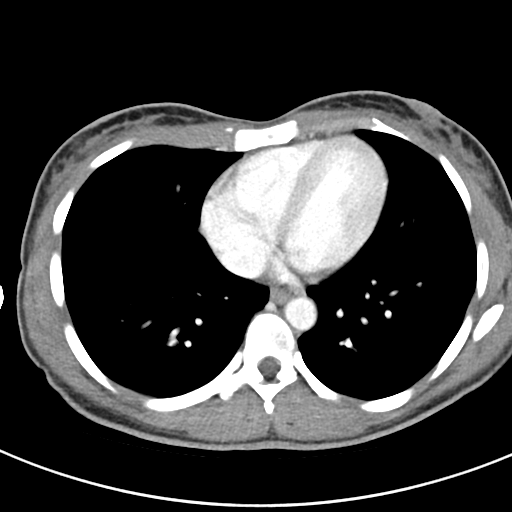

[Series 5: abdomen 3.0 mpr cor · coronal · 0.66mm/px · 3 of 71 slices shown]
[im 24/71  soft-tissue]
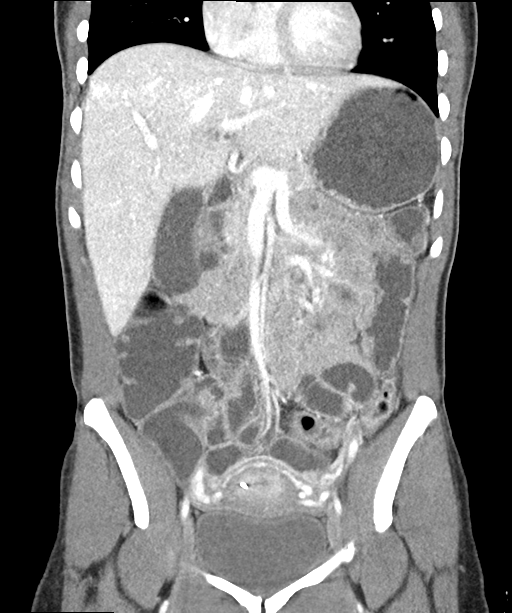
[im 32/71  soft-tissue]
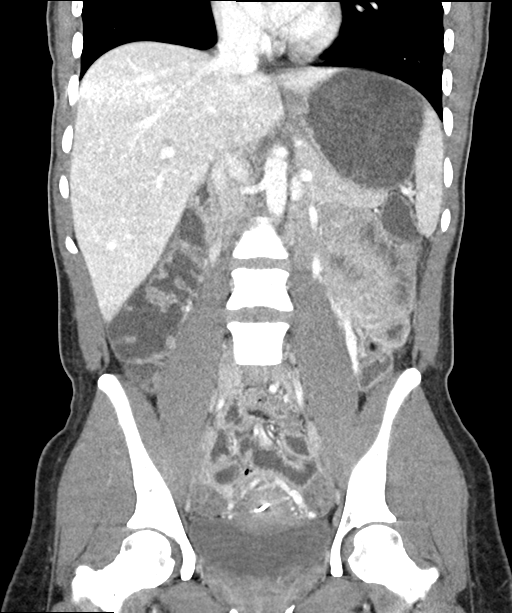
[im 39/71  soft-tissue]
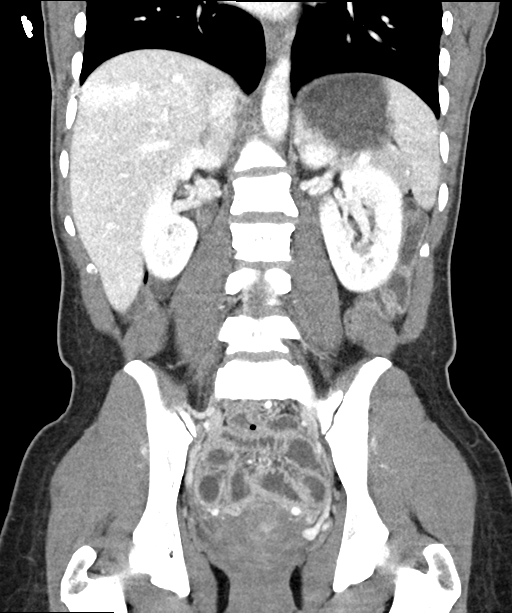

[15 of 46 positions shown; findings below may reference images not displayed]

FINDINGS: Lower chest: Lung bases are clear. Normal heart size. No pericardial
effusion.

Hepatobiliary: No worrisome focal liver lesions. Smooth liver
surface contour. Normal hepatic attenuation. Normal gallbladder and
biliary tree.

Pancreas: No pancreatic ductal dilatation or surrounding
inflammatory changes.

Spleen: Normal in size. No concerning splenic lesions.

Adrenals/Urinary Tract: Normal adrenal glands. Kidneys are normally
located with symmetric enhancement. No suspicious renal lesion,
urolithiasis or hydronephrosis. Urinary bladder is unremarkable for
the degree of distention.

Stomach/Bowel: Distal esophagus is unremarkable. Distal stomach
demonstrating some mild focal thickening possibly related to
peristalsis. Numerous fluid-filled in thickened loops of distal
small bowel are seen. The colon also demonstrates some diffuse
edematous mural thickening with a lack of formed stool. Bowel
caliber remains within normal limits. No evidence of high-grade
bowel obstruction. Appendix is not reliably visualized.

Vascular/Lymphatic: Imaged in a late arterial phase. No aortic
aneurysm or ectasia. Prominence of the bilateral parametrial/pelvic
draining veins. No enlarged abdominal or pelvic lymph nodes.

Reproductive: Anteverted uterus. Rotation of the patient's IUD with
in the endometrial canal with the lateral limbs directed towards the
left fundus and cervix and the proximal stem directed towards the
right fundus. No clear intramural perforation is evident. No
worrisome adnexal masses. Prominence of the parametrial vasculature
bilaterally.

Other: No abdominopelvic free air or fluid. Challenging assessment
given paucity of intraperitoneal fat.

Musculoskeletal: No acute osseous abnormality or suspicious osseous
lesion.
IMPRESSION: Diffusely fluid-filled and mildly thickened appearance of much of
the distal small bowel and colon without evidence of high-grade
obstruction. Lack of formed stool suggesting some rapid transit
state. Such features can be seen with a nonspecific enterocolitis.

Malrotation of the patient's IUD which appears to remain within the
endometrial canal though with the proximal stem directed towards the
uterine fundus.

Slight prominence of the parametrial vasculature, may be accentuated
by by contrast timing though can also be seen in the clinical entity
of pelvic congestion syndrome. Correlate with exam findings.

## 2022-11-12 ENCOUNTER — Other Ambulatory Visit (HOSPITAL_COMMUNITY)
Admission: RE | Admit: 2022-11-12 | Discharge: 2022-11-12 | Disposition: A | Discharge status: Home / Self Care | Payer: 59 | Source: Ambulatory Visit | Attending: Obstetrics and Gynecology | Admitting: Obstetrics and Gynecology

## 2022-11-12 ENCOUNTER — Ambulatory Visit (INDEPENDENT_AMBULATORY_CARE_PROVIDER_SITE_OTHER): Payer: 59 | Admitting: Obstetrics and Gynecology

## 2022-11-12 ENCOUNTER — Encounter: Payer: Self-pay | Admitting: Obstetrics and Gynecology

## 2022-11-12 ENCOUNTER — Other Ambulatory Visit: Payer: Self-pay

## 2022-11-12 VITALS — BP 130/88 | HR 79 | Wt 124.9 lb

## 2022-11-12 DIAGNOSIS — Z01419 Encounter for gynecological examination (general) (routine) without abnormal findings: Secondary | ICD-10-CM

## 2022-11-12 LAB — POCT URINALYSIS DIP (DEVICE)
Bilirubin Urine: NEGATIVE
Glucose, UA: NEGATIVE mg/dL
Hgb urine dipstick: NEGATIVE
Ketones, ur: NEGATIVE mg/dL
Nitrite: NEGATIVE
Protein, ur: NEGATIVE mg/dL
Specific Gravity, Urine: >=1.03 (ref 1.005–1.030)
Urobilinogen, UA: 0.2 mg/dL (ref 0.0–1.0)
pH: 6.5 (ref 5.0–8.0)

## 2022-11-12 NOTE — Progress Notes (Signed)
ANNUAL EXAM Patient name: Marisa Stephens MRN [id number]  Date of birth: May 05, 1991 Chief Complaint:   Gynecologic Exam  History of Present Illness:   Marisa Stephens is a 32 y.o. [id number] being seen today for a routine annual exam.  Current complaints: irritation w/ urination, concerned about possible exposure given new partner  Menstrual concerns? No  amenorrhea with IUD Breast or nipple changes? No  Contraception use? Yes  Sexually active? Yes   No LMP recorded. (Menstrual status: IUD).  Reports having abnormal pap in 2014/2015 and was told would need freezing procedure but then testing was normal Colpo done in 2015 w/ slightly abnormal cells   Last pap     Component Value Date/Time   DIAGPAP (A) 03/12/2021 1447    - Atypical squamous cells of undetermined significance (ASC-US)   DIAGPAP  10/19/2016 0000    NEGATIVE FOR INTRAEPITHELIAL LESIONS OR MALIGNANCY.   DIAGPAP SHIFT IN FLORA SUGGESTIVE OF BACTERIAL VAGINOSIS. 10/19/2016 0000   HPVHIGH Positive (A) 03/12/2021 1447   ADEQPAP  03/12/2021 1447    Satisfactory for evaluation; transformation zone component PRESENT.   ADEQPAP  10/19/2016 0000    Satisfactory for evaluation  endocervical/transformation zone component ABSENT.   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  Reports having other abnormal pap (cancer) found on pregnancy pap but on postop pregnancy follow up "it was gone", this was ~2014/2015  Last mammogram: n/a Last colonoscopy: n/a     04/21/2021    3:43 PM 04/17/2021    3:45 PM 03/17/2021    4:07 PM 03/12/2021    2:22 PM 11/27/2014   10:39 AM  Depression screen PHQ 2/9  Decreased Interest 3 1 1 1  0  Down, Depressed, Hopeless 1 1 1 1  0  PHQ - 2 Score 4 2 2 2  0  Altered sleeping 0 1 1 0   Tired, decreased energy 1 1 1 1    Change in appetite 1 1 0 0   Feeling bad or failure about yourself  1 1 0 1   Trouble concentrating 0 1 0 0   Moving slowly or fidgety/restless 0 0 0 0   Suicidal thoughts 0 0 0 0   PHQ-9  Score 7 7 4 4    Difficult doing work/chores   Not difficult at all          04/21/2021    3:44 PM 04/17/2021    3:45 PM 03/17/2021    4:08 PM 03/12/2021    2:22 PM  GAD 7 : Generalized Anxiety Score  Nervous, Anxious, on Edge 0 0 1 0  Control/stop worrying 1 1 1 1   Worry too much - different things 1 1 1 1   Trouble relaxing 1 0 0 0  Restless 1 0 0 0  Easily annoyed or irritable 1 1 1  0  Afraid - awful might happen 0 0 0 0  Total GAD 7 Score 5 3 4 2   Anxiety Difficulty   Not difficult at all      Review of Systems:   Pertinent items are noted in HPI Denies any headaches, blurred vision, fatigue, shortness of breath, chest pain, abdominal pain, abnormal vaginal discharge/itching/odor/irritation, problems with periods, bowel movements, urination, or intercourse unless otherwise stated above. Pertinent History Reviewed:  Reviewed past medical,surgical, social and family history.  Reviewed problem list, medications and allergies. Physical Assessment:   Vitals:   11/12/22 0850  BP: 130/88  Pulse: 79  Weight: 124 lb 14.4 oz (56.7 kg)  Body mass index is  23.6 kg/m.        Physical Examination:   General appearance - well appearing, and in no distress  Mental status - alert, oriented to person, place, and time  Psych:  She has a normal mood and affect  Skin - warm and dry, normal color, no suspicious lesions noted  Chest - effort normal, all lung fields clear to auscultation bilaterally  Heart - normal rate and regular rhythm  Abdomen - soft, nontender, nondistended, no masses or organomegaly  Pelvic -  VULVA: normal appearing vulva with no masses, tenderness or lesion VAGINA: normal appearing vagina with normal color and discharge, no lesions   CERVIX: normal appearing cervix without discharge or lesions, slight tenderness w//with pap, blue IUD strings visualized  Thin prep pap is done with HR HPV cotesting  Extremities:  No swelling or varicosities noted  Chaperone  present for exam  No results found for this or any previous visit (from the past 24 hour(s)).    Assessment & Plan:  1. Well woman exam with routine gynecological exam - Cervical cancer screening: Discussed guidelines. Pap with HPV collected - GC/CT: accepts - Birth Control: IUD - Breast Health: Encouraged self breast awareness/SBE. Teaching provided.  - F/U 12 months and prn - Cytology - PAP - Cervicovaginal ancillary only    Orders Placed This Encounter  Procedures   RPR   HIV Antibody (routine testing w rflx)   Hepatitis B Surface AntiGEN   Hepatitis C Antibody   POCT urinalysis dip (device)    Meds: No orders of the defined types were placed in this encounter.   Follow-up: No follow-ups on file.  Lorriane Shire, MD 11/12/2022 8:57 AM

## 2022-11-13 LAB — CERVICOVAGINAL ANCILLARY ONLY
Bacterial Vaginitis (gardnerella): POSITIVE — AB
Candida Glabrata: POSITIVE — AB
Candida Vaginitis: POSITIVE — AB
Chlamydia: NEGATIVE
Comment: NEGATIVE
Comment: NEGATIVE
Comment: NEGATIVE
Comment: NEGATIVE
Comment: NEGATIVE
Comment: NORMAL
Neisseria Gonorrhea: NEGATIVE
Trichomonas: NEGATIVE

## 2022-11-13 LAB — HEPATITIS C ANTIBODY: Hep C Virus Ab: NONREACTIVE

## 2022-11-13 LAB — HIV ANTIBODY (ROUTINE TESTING W REFLEX): HIV Screen 4th Generation wRfx: NONREACTIVE

## 2022-11-13 LAB — RPR: RPR Ser Ql: NONREACTIVE

## 2022-11-13 LAB — HEPATITIS B SURFACE ANTIGEN: Hepatitis B Surface Ag: NEGATIVE

## 2022-11-16 ENCOUNTER — Other Ambulatory Visit: Payer: Self-pay | Admitting: Obstetrics and Gynecology

## 2022-11-16 DIAGNOSIS — B379 Candidiasis, unspecified: Secondary | ICD-10-CM

## 2022-11-16 DIAGNOSIS — B3731 Acute candidiasis of vulva and vagina: Secondary | ICD-10-CM

## 2022-11-16 DIAGNOSIS — B9689 Other specified bacterial agents as the cause of diseases classified elsewhere: Secondary | ICD-10-CM

## 2022-11-16 MED ORDER — BORIC ACID CRYS
600.0000 mg | CRYSTALS | Freq: Every day | 2 refills | Status: AC
Start: 2022-11-16 — End: 2022-11-30

## 2022-11-16 MED ORDER — FLUCONAZOLE 150 MG PO TABS
150.0000 mg | ORAL_TABLET | Freq: Once | ORAL | 3 refills | Status: AC
Start: 2022-11-16 — End: 2022-11-16

## 2022-11-19 LAB — CYTOLOGY - PAP
Comment: NEGATIVE
Comment: NEGATIVE
Comment: NEGATIVE
HPV 16: NEGATIVE
HPV 18 / 45: POSITIVE — AB
High risk HPV: POSITIVE — AB

## 2022-11-25 ENCOUNTER — Telehealth: Payer: Self-pay | Admitting: *Deleted

## 2022-11-25 NOTE — Telephone Encounter (Signed)
I called Latangela and informed her of pap results and need for colposcopy per Dr. Briscoe Deutscher. I reveiwed appointment. I answered her questions. She voices understanding. She also confirmed she had been notified and treated for BV/ yeast which I verifed per chart review.  Nancy Fetter

## 2022-12-15 ENCOUNTER — Ambulatory Visit: Payer: 59 | Admitting: Obstetrics and Gynecology

## 2023-03-13 ENCOUNTER — Ambulatory Visit (HOSPITAL_COMMUNITY): Payer: 59

## 2023-05-19 ENCOUNTER — Encounter (HOSPITAL_COMMUNITY): Payer: Self-pay | Admitting: Emergency Medicine

## 2023-05-19 ENCOUNTER — Ambulatory Visit (HOSPITAL_COMMUNITY)
Admission: EM | Admit: 2023-05-19 | Discharge: 2023-05-19 | Disposition: A | Discharge status: Home / Self Care | Payer: 59 | Attending: Family Medicine | Admitting: Family Medicine

## 2023-05-19 DIAGNOSIS — Z113 Encounter for screening for infections with a predominantly sexual mode of transmission: Secondary | ICD-10-CM

## 2023-05-19 DIAGNOSIS — Z202 Contact with and (suspected) exposure to infections with a predominantly sexual mode of transmission: Secondary | ICD-10-CM

## 2023-05-19 LAB — HIV ANTIBODY (ROUTINE TESTING W REFLEX): HIV Screen 4th Generation wRfx: NONREACTIVE

## 2023-05-19 LAB — RPR: RPR Ser Ql: NONREACTIVE

## 2023-05-19 MED ORDER — CEFTRIAXONE SODIUM 500 MG IJ SOLR
INTRAMUSCULAR | Status: AC
  Filled 2023-05-19: qty 500

## 2023-05-19 MED ORDER — LIDOCAINE HCL (PF) 1 % IJ SOLN
INTRAMUSCULAR | Status: AC
  Filled 2023-05-19: qty 2

## 2023-05-19 MED ORDER — CEFTRIAXONE SODIUM 500 MG IJ SOLR
500.0000 mg | Freq: Once | INTRAMUSCULAR | Status: AC
  Administered 2023-05-19: 500 mg via INTRAMUSCULAR

## 2023-05-19 MED ORDER — DOXYCYCLINE HYCLATE 100 MG PO CAPS: 100.0000 mg | ORAL_CAPSULE | Freq: Two times a day (BID) | ORAL | 0 refills | Status: DC

## 2023-05-19 NOTE — Discharge Instructions (Signed)

## 2023-05-19 NOTE — ED Provider Notes (Signed)
[facility]   [id number] 05/19/23 Arrival Time: 0865  ASSESSMENT & PLAN:  1. Screening for STDs (sexually transmitted diseases)   2. Possible exposure to STD       Discharge Instructions      You have been given the following today for treatment of suspected gonorrhea and/or chlamydia:  cefTRIAXone (ROCEPHIN) injection 500 mg  Please pick up your prescription for doxycycline 100 mg and begin taking twice daily for the next seven (7) days.  Even though we have treated you today, we have sent testing for sexually transmitted infections. We will notify you of any positive results once they are received. If required, we will prescribe any medications you might need.  Please refrain from all sexual activity for at least the next seven days.     Without s/s of PID.  Labs Reviewed  HIV ANTIBODY (ROUTINE TESTING W REFLEX)  RPR  CERVICOVAGINAL ANCILLARY ONLY    Pending: Unresulted Labs (From admission, onward)     Start     Ordered   05/19/23 1026  HIV Antibody (routine testing w rflx)  (HIV Antibody (Routine testing w reflex) panel)  Once,   URGENT        05/19/23 1025   05/19/23 1026  RPR  Once,   URGENT        05/19/23 1025             Will notify of any positive results. Instructed to refrain from sexual activity for at least seven days.  Reviewed expectations re: course of current medical issues. Questions answered. Outlined signs and symptoms indicating need for more acute intervention. Patient verbalized understanding. After Visit Summary given.   SUBJECTIVE:  Marisa Stephens is a 32 y.o. female who requests STD testing. Partner cheated on her and reports possibility other person had STD. She reports light white vaginal discharge. No specific aggravating or alleviating factors reported. Denies: urinary frequency, dysuria, and gross hematuria. Afebrile. No abdominal or pelvic pain. Normal PO intake wihout n/v. No genital rashes or lesions. No  tx PTA.  No LMP recorded. (Menstrual status: IUD).   OBJECTIVE:  Vitals:   05/19/23 1012  BP: 104/73  Pulse: 91  Resp: 16  Temp: 98.1 F (36.7 C)  TempSrc: Oral  SpO2: 98%     General appearance: alert, cooperative, appears stated age and no distress Lungs: unlabored respirations; speaks full sentences without difficulty GU: deferred Skin: dry Psychological: alert and cooperative; normal mood and affect.    Labs Reviewed  HIV ANTIBODY (ROUTINE TESTING W REFLEX)  RPR  CERVICOVAGINAL ANCILLARY ONLY    No Known Allergies  Past Medical History:  Diagnosis Date   Chronic kidney disease    treated   Former smoker    Group beta Strep positive 2016   Headache    migraines; Tylenol helps   Hx of chlamydia infection 2010   treated   Marijuana use 12/06/2014   positive   Post term pregnancy at [redacted] weeks gestation 05/10/2017   Supervision of high risk pregnancy, antepartum 10/16/2016    Clinic [facility] Prenatal Labs Dating  LMP 07/05/16 Blood type: O/Positive/-- (04/23 1617)  Genetic Screen AFP:     NIPS:Genome First screen neg Antibody:Negative (04/23 1617) Anatomic [place]  normal, posterior placenta Rubella: 4.15 (04/23 1617) GTT Third trimester: nl 2hr RPR: Non Reactive (08/16 0853)  Flu vaccine 04-08-17 HBsAg: Negative (04/23 1617)  TDaP vaccine  02/11/17  Unwanted fertility 04/08/2017   Family History  Problem Relation Age of Onset   Cancer Maternal Grandmother        lung   Diabetes Maternal Grandmother    Social History   Socioeconomic History   Marital status: Single    Spouse name: Not on file   Number of children: Not on file   Years of education: Not on file   Highest education level: Not on file  Occupational History   Not on file  Tobacco Use   Smoking status: Every Day    Current packs/day: 0.50    Average packs/day: 0.5 packs/day for 5.0 years (2.5 ttl pk-yrs)    Types: Cigarettes   Smokeless tobacco: Never  Vaping Use    Vaping status: Former  Substance and Sexual Activity   Alcohol use: Yes    Comment: occasionally   Drug use: Yes    Types: Marijuana    Comment: not using at this moment   Sexual activity: Yes    Birth control/protection: I.U.D.  Other Topics Concern   Not on file  Social History Narrative   Not on file   Social Determinants of Health   Financial Resource Strain: Low Risk  (05/04/2018)   Overall Financial Resource Strain (CARDIA)    Difficulty of Paying Living Expenses: Not hard at all  Food Insecurity: No Food Insecurity (04/17/2021)   Hunger Vital Sign    Worried About Running Out of Food in the Last Year: Never true    Ran Out of Food in the Last Year: Never true  Transportation Needs: Unmet Transportation Needs (04/17/2021)   PRAPARE - Administrator, Civil Service (Medical): Yes    Lack of Transportation (Non-Medical): Yes  Physical Activity: Not on file  Stress: Not on file  Social Connections: Not on file  Intimate Partner Violence: Not on file           [place], MD 05/19/23 1036

## 2023-05-19 NOTE — ED Triage Notes (Signed)
Pt presents for STD testing. Pt states her partner was with someone else who had an STD

## 2023-05-20 ENCOUNTER — Telehealth (HOSPITAL_COMMUNITY): Payer: Self-pay

## 2023-05-20 LAB — CERVICOVAGINAL ANCILLARY ONLY
Chlamydia: NEGATIVE
Comment: NEGATIVE
Comment: NEGATIVE
Comment: NORMAL
Neisseria Gonorrhea: NEGATIVE
Trichomonas: POSITIVE — AB

## 2023-05-20 MED ORDER — METRONIDAZOLE 500 MG PO TABS: 500.0000 mg | ORAL_TABLET | Freq: Two times a day (BID) | ORAL | 0 refills | Status: DC

## 2023-05-20 NOTE — Telephone Encounter (Signed)
See result note.  

## 2023-05-21 ENCOUNTER — Telehealth (HOSPITAL_COMMUNITY): Payer: Self-pay

## 2023-05-21 MED ORDER — METRONIDAZOLE 500 MG PO TABS: 500.0000 mg | ORAL_TABLET | Freq: Two times a day (BID) | ORAL | 0 refills | Status: AC

## 2023-05-21 NOTE — Telephone Encounter (Signed)
This RN returned pt's phone call at this time. Verified name and date of birth. Pt called as she went to pickup prescribed Flagyl at the [facility] on [address], pharmacy stated the prescription was never sent to their location electronically. Viewing pt's chart, prescribed [facility] [address] Rock Nephew [facility] we [facility] to [facility] [address]. Pharmacy updated by this RN. Spoke with Marlinda Mike MD and prescription for Flagyl was sent to pt's preferred pharmacy. Pt verbalized understanding. All questions/concerns addressed at this time.

## 2023-05-23 ENCOUNTER — Encounter (HOSPITAL_COMMUNITY): Payer: Self-pay | Admitting: *Deleted

## 2023-11-10 ENCOUNTER — Encounter (HOSPITAL_COMMUNITY): Payer: Self-pay

## 2023-11-10 ENCOUNTER — Emergency Department (HOSPITAL_COMMUNITY)
Admission: EM | Admit: 2023-11-10 | Discharge: 2023-11-11 | Disposition: A | Discharge status: Home / Self Care | Attending: Emergency Medicine | Admitting: Emergency Medicine

## 2023-11-10 ENCOUNTER — Emergency Department (HOSPITAL_COMMUNITY)

## 2023-11-10 ENCOUNTER — Other Ambulatory Visit: Payer: Self-pay

## 2023-11-10 DIAGNOSIS — S41111A Laceration without foreign body of right upper arm, initial encounter: Secondary | ICD-10-CM | POA: Diagnosis present

## 2023-11-10 DIAGNOSIS — Z23 Encounter for immunization: Secondary | ICD-10-CM | POA: Diagnosis not present

## 2023-11-10 DIAGNOSIS — W25XXXA Contact with sharp glass, initial encounter: Secondary | ICD-10-CM | POA: Insufficient documentation

## 2023-11-10 MED ORDER — TETANUS-DIPHTH-ACELL PERTUSSIS 5-2.5-18.5 LF-MCG/0.5 IM SUSY
0.5000 mL | PREFILLED_SYRINGE | Freq: Once | INTRAMUSCULAR | Status: AC
  Administered 2023-11-10: 0.5 mL via INTRAMUSCULAR

## 2023-11-10 MED ORDER — HYDROMORPHONE HCL 1 MG/ML IJ SOLN
1.0000 mg | Freq: Once | INTRAMUSCULAR | Status: AC
  Administered 2023-11-10: 1 mg via INTRAVENOUS
  Filled 2023-11-10: qty 1

## 2023-11-10 MED ORDER — LACTATED RINGERS IV BOLUS
1000.0000 mL | Freq: Once | INTRAVENOUS | Status: AC
  Administered 2023-11-10: 1000 mL via INTRAVENOUS

## 2023-11-10 MED ORDER — CEFAZOLIN SODIUM-DEXTROSE 1-4 GM/50ML-% IV SOLN
1.0000 g | Freq: Once | INTRAVENOUS | Status: AC
  Administered 2023-11-10: 1 g via INTRAVENOUS
  Filled 2023-11-10: qty 50
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
## 2023-11-11 LAB — COMPREHENSIVE METABOLIC PANEL WITH GFR
ALT: 18 U/L (ref 0–44)
AST: 27 U/L (ref 15–41)
Albumin: 5.1 g/dL — ABNORMAL HIGH (ref 3.5–5.0)
Alkaline Phosphatase: 81 U/L (ref 38–126)
Anion gap: 11 (ref 5–15)
BUN: 19 mg/dL (ref 6–20)
CO2: 22 mmol/L (ref 22–32)
Calcium: 9.9 mg/dL (ref 8.9–10.3)
Chloride: 103 mmol/L (ref 98–111)
Creatinine, Ser: 1.04 mg/dL — ABNORMAL HIGH (ref 0.44–1.00)
GFR, Estimated: >60 mL/min (ref >60)
Glucose, Bld: 118 mg/dL — ABNORMAL HIGH (ref 70–99)
Potassium: 3.3 mmol/L — ABNORMAL LOW (ref 3.5–5.1)
Sodium: 136 mmol/L (ref 135–145)
Total Bilirubin: 1.6 mg/dL — ABNORMAL HIGH (ref 0.0–1.2)
Total Protein: 8.5 g/dL — ABNORMAL HIGH (ref 6.5–8.1)

## 2023-11-11 LAB — CBC WITH DIFFERENTIAL/PLATELET
Abs Immature Granulocytes: 0.01 10*3/uL (ref 0.00–0.07)
Basophils Absolute: 0.1 10*3/uL (ref 0.0–0.1)
Basophils Relative: 1 %
Eosinophils Absolute: 0.2 10*3/uL (ref 0.0–0.5)
Eosinophils Relative: 2 %
HCT: 42 % (ref 36.0–46.0)
Hemoglobin: 14.6 g/dL (ref 12.0–15.0)
Immature Granulocytes: 0 %
Lymphocytes Relative: 50 %
Lymphs Abs: 4 10*3/uL (ref 0.7–4.0)
MCH: 34.5 pg — ABNORMAL HIGH (ref 26.0–34.0)
MCHC: 34.8 g/dL (ref 30.0–36.0)
MCV: 99.3 fL (ref 80.0–100.0)
Monocytes Absolute: 0.4 10*3/uL (ref 0.1–1.0)
Monocytes Relative: 5 %
Neutro Abs: 3.3 10*3/uL (ref 1.7–7.7)
Neutrophils Relative %: 42 %
Platelets: 285 10*3/uL (ref 150–400)
RBC: 4.23 MIL/uL (ref 3.87–5.11)
RDW: 11.5 % (ref 11.5–15.5)
WBC: 7.9 10*3/uL (ref 4.0–10.5)
nRBC: 0 % (ref 0.0–0.2)

## 2023-11-11 MED ORDER — ONDANSETRON HCL 4 MG/2ML IJ SOLN
4.0000 mg | Freq: Once | INTRAMUSCULAR | Status: AC
Start: 2023-11-11 — End: 2023-11-11
  Administered 2023-11-11: 4 mg via INTRAVENOUS
  Filled 2023-11-11: qty 2

## 2023-11-11 MED ORDER — OXYCODONE-ACETAMINOPHEN 5-325 MG PO TABS: 2.0000 | ORAL_TABLET | ORAL | 0 refills | Status: DC | PRN

## 2023-11-11 MED ORDER — ONDANSETRON 4 MG PO TBDP: 4.0000 mg | ORAL_TABLET | Freq: Three times a day (TID) | ORAL | 0 refills | Status: AC | PRN

## 2023-11-11 MED ORDER — OXYCODONE-ACETAMINOPHEN 5-325 MG PO TABS: 2.0000 | ORAL_TABLET | ORAL | 0 refills | Status: AC | PRN

## 2023-11-11 MED ORDER — AMOXICILLIN-POT CLAVULANATE 875-125 MG PO TABS: 1.0000 | ORAL_TABLET | Freq: Two times a day (BID) | ORAL | 0 refills | Status: AC

## 2023-11-11 NOTE — Discharge Instructions (Signed)
 I have only loosely closed your wound to ensure less chance of infection. It is very important to follow up with the surgeon to ensure proper healing and further management of the wound and the sutures. I have started you on antibiotics, please ensure you are taking care of them and care for the wound as I have described to you. Return to the ED with any new/worsening symptoms (such as bleeding, neurologic change, strength change or signs of infection, etc.).

## 2023-11-23 ENCOUNTER — Ambulatory Visit: Admitting: Orthopaedic Surgery

## 2023-12-07 ENCOUNTER — Ambulatory Visit: Admitting: Orthopaedic Surgery

## 2023-12-07 DIAGNOSIS — S41111A Laceration without foreign body of right upper arm, initial encounter: Secondary | ICD-10-CM | POA: Diagnosis not present
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
## 2023-12-22 ENCOUNTER — Other Ambulatory Visit (HOSPITAL_COMMUNITY)
Admission: RE | Admit: 2023-12-22 | Discharge: 2023-12-22 | Disposition: A | Discharge status: Home / Self Care | Source: Ambulatory Visit | Attending: Obstetrics and Gynecology | Admitting: Obstetrics and Gynecology

## 2023-12-22 ENCOUNTER — Ambulatory Visit (INDEPENDENT_AMBULATORY_CARE_PROVIDER_SITE_OTHER): Admitting: Obstetrics and Gynecology

## 2023-12-22 ENCOUNTER — Encounter: Payer: Self-pay | Admitting: Obstetrics and Gynecology

## 2023-12-22 VITALS — BP 108/67 | HR 90 | Wt 115.5 lb

## 2023-12-22 DIAGNOSIS — Z113 Encounter for screening for infections with a predominantly sexual mode of transmission: Secondary | ICD-10-CM | POA: Diagnosis not present

## 2023-12-22 DIAGNOSIS — F4310 Post-traumatic stress disorder, unspecified: Secondary | ICD-10-CM

## 2023-12-22 NOTE — Patient Instructions (Addendum)
 Support Group: [facility]
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
## 2023-12-23 ENCOUNTER — Ambulatory Visit: Payer: Self-pay | Admitting: Obstetrics and Gynecology

## 2023-12-23 DIAGNOSIS — B3731 Acute candidiasis of vulva and vagina: Secondary | ICD-10-CM

## 2023-12-23 DIAGNOSIS — A749 Chlamydial infection, unspecified: Secondary | ICD-10-CM

## 2023-12-23 LAB — CERVICOVAGINAL ANCILLARY ONLY
Candida Glabrata: NEGATIVE
Candida Vaginitis: NEGATIVE
Chlamydia: POSITIVE — AB
Comment: NEGATIVE
Comment: NEGATIVE
Comment: NEGATIVE
Comment: NEGATIVE
Comment: NORMAL
Neisseria Gonorrhea: NEGATIVE
Trichomonas: NEGATIVE

## 2023-12-23 LAB — RPR+HBSAG+HCVAB+...
HIV Screen 4th Generation wRfx: NONREACTIVE
Hep C Virus Ab: NONREACTIVE
Hepatitis B Surface Ag: NEGATIVE
RPR Ser Ql: NONREACTIVE

## 2023-12-27 ENCOUNTER — Telehealth: Payer: Self-pay

## 2023-12-27 MED ORDER — FLUCONAZOLE 150 MG PO TABS: 150.0000 mg | ORAL_TABLET | Freq: Once | ORAL | 1 refills | Status: AC

## 2023-12-27 MED ORDER — DOXYCYCLINE HYCLATE 100 MG PO CAPS: 100.0000 mg | ORAL_CAPSULE | Freq: Two times a day (BID) | ORAL | 1 refills | Status: AC

## 2023-12-27 NOTE — Telephone Encounter (Signed)
 Called Pt to go over Positive Chlamydia test results, Pt advised did see message on My Chart, verified Pharmacy & for Partner to go to Health Dept. Or PCP to get treated. Pt verbalized understanding.

## 2024-01-28 NOTE — BH Specialist Note (Signed)
 Pt did not arrive to video visit and did not answer the phone; Left HIPPA-compliant message to call back Warren from [employer] for [employer] at [facility] at  [phone] [facility] office).  ?; left MyChart message for patient.  ? ?

## 2024-01-31 ENCOUNTER — Ambulatory Visit: Payer: Self-pay | Admitting: Clinical

## 2024-01-31 DIAGNOSIS — Z91199 Patient's noncompliance with other medical treatment and regimen due to unspecified reason: Secondary | ICD-10-CM

## 2024-02-04 ENCOUNTER — Encounter: Payer: Self-pay | Admitting: Obstetrics and Gynecology

## 2024-02-04 ENCOUNTER — Ambulatory Visit: Admitting: Obstetrics and Gynecology

## 2024-02-07 NOTE — Progress Notes (Signed)
 Patient did not keep her colposcopy appointment for 02/04/2024.  Marisa Izell Raddle MD Attending Center for [employer] Midwife)

## 2024-05-01 ENCOUNTER — Encounter: Payer: Self-pay | Admitting: Radiology
